# Patient Record
Sex: Male | Born: 1945 | Race: White | Hispanic: No | Smoking: Former smoker
Health system: Southern US, Community
[De-identification: ages and names within clinical notes are randomized; demographics above are authoritative.]

## PROBLEM LIST (undated history)

## (undated) DIAGNOSIS — Z860100 Personal history of colon polyps, unspecified: Secondary | ICD-10-CM

## (undated) DIAGNOSIS — E785 Hyperlipidemia, unspecified: Secondary | ICD-10-CM

## (undated) DIAGNOSIS — M720 Palmar fascial fibromatosis [Dupuytren]: Secondary | ICD-10-CM

## (undated) DIAGNOSIS — K5730 Diverticulosis of large intestine without perforation or abscess without bleeding: Secondary | ICD-10-CM

## (undated) DIAGNOSIS — M2040 Other hammer toe(s) (acquired), unspecified foot: Secondary | ICD-10-CM

## (undated) DIAGNOSIS — K648 Other hemorrhoids: Secondary | ICD-10-CM

## (undated) DIAGNOSIS — J189 Pneumonia, unspecified organism: Secondary | ICD-10-CM

## (undated) DIAGNOSIS — K219 Gastro-esophageal reflux disease without esophagitis: Secondary | ICD-10-CM

## (undated) DIAGNOSIS — B029 Zoster without complications: Secondary | ICD-10-CM

## (undated) DIAGNOSIS — I8393 Asymptomatic varicose veins of bilateral lower extremities: Secondary | ICD-10-CM

## (undated) DIAGNOSIS — I872 Venous insufficiency (chronic) (peripheral): Secondary | ICD-10-CM

## (undated) DIAGNOSIS — I10 Essential (primary) hypertension: Secondary | ICD-10-CM

## (undated) DIAGNOSIS — I809 Phlebitis and thrombophlebitis of unspecified site: Secondary | ICD-10-CM

## (undated) HISTORY — DX: Gastro-esophageal reflux disease without esophagitis: K21.9

---
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
## 1984-07-13 HISTORY — PX: HERNIA REPAIR: SHX51

## 2009-08-06 ENCOUNTER — Ambulatory Visit: Payer: Self-pay | Admitting: Gastroenterology

## 2010-01-24 ENCOUNTER — Emergency Department: Payer: Self-pay | Admitting: Emergency Medicine

## 2010-03-31 ENCOUNTER — Ambulatory Visit: Payer: Self-pay | Admitting: Ophthalmology

## 2010-10-29 ENCOUNTER — Ambulatory Visit: Payer: Self-pay | Admitting: Ophthalmology

## 2010-11-11 ENCOUNTER — Ambulatory Visit: Payer: Self-pay | Admitting: Ophthalmology

## 2011-07-14 HISTORY — PX: EYE SURGERY: SHX253

## 2011-09-16 DIAGNOSIS — H43819 Vitreous degeneration, unspecified eye: Secondary | ICD-10-CM | POA: Diagnosis not present

## 2011-10-26 DIAGNOSIS — M999 Biomechanical lesion, unspecified: Secondary | ICD-10-CM | POA: Diagnosis not present

## 2011-10-26 DIAGNOSIS — M549 Dorsalgia, unspecified: Secondary | ICD-10-CM | POA: Diagnosis not present

## 2011-10-26 DIAGNOSIS — IMO0002 Reserved for concepts with insufficient information to code with codable children: Secondary | ICD-10-CM | POA: Diagnosis not present

## 2011-10-28 DIAGNOSIS — M549 Dorsalgia, unspecified: Secondary | ICD-10-CM | POA: Diagnosis not present

## 2011-10-28 DIAGNOSIS — IMO0002 Reserved for concepts with insufficient information to code with codable children: Secondary | ICD-10-CM | POA: Diagnosis not present

## 2011-10-28 DIAGNOSIS — M999 Biomechanical lesion, unspecified: Secondary | ICD-10-CM | POA: Diagnosis not present

## 2011-10-28 DIAGNOSIS — H33309 Unspecified retinal break, unspecified eye: Secondary | ICD-10-CM | POA: Diagnosis not present

## 2011-11-02 DIAGNOSIS — M549 Dorsalgia, unspecified: Secondary | ICD-10-CM | POA: Diagnosis not present

## 2011-11-02 DIAGNOSIS — M999 Biomechanical lesion, unspecified: Secondary | ICD-10-CM | POA: Diagnosis not present

## 2011-11-02 DIAGNOSIS — IMO0002 Reserved for concepts with insufficient information to code with codable children: Secondary | ICD-10-CM | POA: Diagnosis not present

## 2011-11-05 DIAGNOSIS — M549 Dorsalgia, unspecified: Secondary | ICD-10-CM | POA: Diagnosis not present

## 2011-11-05 DIAGNOSIS — M999 Biomechanical lesion, unspecified: Secondary | ICD-10-CM | POA: Diagnosis not present

## 2011-11-05 DIAGNOSIS — IMO0002 Reserved for concepts with insufficient information to code with codable children: Secondary | ICD-10-CM | POA: Diagnosis not present

## 2011-11-11 DIAGNOSIS — IMO0002 Reserved for concepts with insufficient information to code with codable children: Secondary | ICD-10-CM | POA: Diagnosis not present

## 2011-11-11 DIAGNOSIS — M999 Biomechanical lesion, unspecified: Secondary | ICD-10-CM | POA: Diagnosis not present

## 2011-11-11 DIAGNOSIS — M549 Dorsalgia, unspecified: Secondary | ICD-10-CM | POA: Diagnosis not present

## 2012-03-02 DIAGNOSIS — H43819 Vitreous degeneration, unspecified eye: Secondary | ICD-10-CM | POA: Diagnosis not present

## 2012-04-11 DIAGNOSIS — H43819 Vitreous degeneration, unspecified eye: Secondary | ICD-10-CM | POA: Diagnosis not present

## 2012-04-12 DIAGNOSIS — M5137 Other intervertebral disc degeneration, lumbosacral region: Secondary | ICD-10-CM | POA: Diagnosis not present

## 2012-04-12 DIAGNOSIS — S338XXA Sprain of other parts of lumbar spine and pelvis, initial encounter: Secondary | ICD-10-CM | POA: Diagnosis not present

## 2012-04-12 DIAGNOSIS — M5430 Sciatica, unspecified side: Secondary | ICD-10-CM | POA: Diagnosis not present

## 2012-04-12 DIAGNOSIS — M999 Biomechanical lesion, unspecified: Secondary | ICD-10-CM | POA: Diagnosis not present

## 2012-04-14 DIAGNOSIS — S338XXA Sprain of other parts of lumbar spine and pelvis, initial encounter: Secondary | ICD-10-CM | POA: Diagnosis not present

## 2012-04-14 DIAGNOSIS — M5430 Sciatica, unspecified side: Secondary | ICD-10-CM | POA: Diagnosis not present

## 2012-04-14 DIAGNOSIS — M5137 Other intervertebral disc degeneration, lumbosacral region: Secondary | ICD-10-CM | POA: Diagnosis not present

## 2012-04-14 DIAGNOSIS — M999 Biomechanical lesion, unspecified: Secondary | ICD-10-CM | POA: Diagnosis not present

## 2012-04-18 DIAGNOSIS — M5137 Other intervertebral disc degeneration, lumbosacral region: Secondary | ICD-10-CM | POA: Diagnosis not present

## 2012-04-18 DIAGNOSIS — M999 Biomechanical lesion, unspecified: Secondary | ICD-10-CM | POA: Diagnosis not present

## 2012-04-18 DIAGNOSIS — M5430 Sciatica, unspecified side: Secondary | ICD-10-CM | POA: Diagnosis not present

## 2012-04-18 DIAGNOSIS — S338XXA Sprain of other parts of lumbar spine and pelvis, initial encounter: Secondary | ICD-10-CM | POA: Diagnosis not present

## 2012-05-30 DIAGNOSIS — L723 Sebaceous cyst: Secondary | ICD-10-CM | POA: Diagnosis not present

## 2012-05-30 DIAGNOSIS — L909 Atrophic disorder of skin, unspecified: Secondary | ICD-10-CM | POA: Diagnosis not present

## 2012-05-30 DIAGNOSIS — L919 Hypertrophic disorder of the skin, unspecified: Secondary | ICD-10-CM | POA: Diagnosis not present

## 2012-05-30 DIAGNOSIS — L570 Actinic keratosis: Secondary | ICD-10-CM | POA: Diagnosis not present

## 2012-05-30 DIAGNOSIS — D1800 Hemangioma unspecified site: Secondary | ICD-10-CM | POA: Diagnosis not present

## 2012-05-30 DIAGNOSIS — D239 Other benign neoplasm of skin, unspecified: Secondary | ICD-10-CM | POA: Diagnosis not present

## 2012-05-30 DIAGNOSIS — L821 Other seborrheic keratosis: Secondary | ICD-10-CM | POA: Diagnosis not present

## 2012-07-18 DIAGNOSIS — N400 Enlarged prostate without lower urinary tract symptoms: Secondary | ICD-10-CM | POA: Diagnosis not present

## 2012-08-01 DIAGNOSIS — H43819 Vitreous degeneration, unspecified eye: Secondary | ICD-10-CM | POA: Diagnosis not present

## 2012-11-28 DIAGNOSIS — H35419 Lattice degeneration of retina, unspecified eye: Secondary | ICD-10-CM | POA: Diagnosis not present

## 2013-04-06 DIAGNOSIS — R079 Chest pain, unspecified: Secondary | ICD-10-CM | POA: Diagnosis not present

## 2013-04-06 DIAGNOSIS — Z23 Encounter for immunization: Secondary | ICD-10-CM | POA: Diagnosis not present

## 2013-04-11 DIAGNOSIS — R079 Chest pain, unspecified: Secondary | ICD-10-CM | POA: Diagnosis not present

## 2013-04-11 DIAGNOSIS — I4891 Unspecified atrial fibrillation: Secondary | ICD-10-CM | POA: Diagnosis not present

## 2013-04-11 DIAGNOSIS — E039 Hypothyroidism, unspecified: Secondary | ICD-10-CM | POA: Diagnosis not present

## 2013-04-13 DIAGNOSIS — R079 Chest pain, unspecified: Secondary | ICD-10-CM | POA: Diagnosis not present

## 2013-04-17 DIAGNOSIS — R030 Elevated blood-pressure reading, without diagnosis of hypertension: Secondary | ICD-10-CM | POA: Diagnosis not present

## 2013-04-28 DIAGNOSIS — R079 Chest pain, unspecified: Secondary | ICD-10-CM | POA: Diagnosis not present

## 2013-05-01 DIAGNOSIS — H905 Unspecified sensorineural hearing loss: Secondary | ICD-10-CM | POA: Diagnosis not present

## 2013-05-01 DIAGNOSIS — H6120 Impacted cerumen, unspecified ear: Secondary | ICD-10-CM | POA: Diagnosis not present

## 2013-05-09 ENCOUNTER — Encounter: Payer: Self-pay | Admitting: *Deleted

## 2013-05-22 ENCOUNTER — Encounter: Payer: Self-pay | Admitting: General Surgery

## 2013-05-22 ENCOUNTER — Ambulatory Visit (INDEPENDENT_AMBULATORY_CARE_PROVIDER_SITE_OTHER): Payer: Medicare Other | Admitting: General Surgery

## 2013-05-22 VITALS — BP 150/84 | HR 81 | Resp 14 | Ht 72.0 in | Wt 249.0 lb

## 2013-05-22 DIAGNOSIS — I83893 Varicose veins of bilateral lower extremities with other complications: Secondary | ICD-10-CM

## 2013-05-22 NOTE — Patient Instructions (Addendum)
Varicose Veins Varicose veins are veins that have become enlarged and twisted. CAUSES This condition is the result of valves in the veins not working properly. Valves in the veins help return blood from the leg to the heart. If these valves are damaged, blood flows backwards and backs up into the veins in the leg near the skin. This causes the veins to become larger. People who are on their feet a lot, who are pregnant, or who are overweight are more likely to develop varicose veins. SYMPTOMS   Bulging, twisted-appearing, bluish veins, most commonly found on the legs.  Leg pain or a feeling of heaviness. These symptoms may be worse at the end of the day.  Leg swelling.  Skin color changes. DIAGNOSIS  Varicose veins can usually be diagnosed with an exam of your legs by your caregiver. He or she may recommend an ultrasound of your leg veins. TREATMENT  Most varicose veins can be treated at home.However, other treatments are available for people who have persistent symptoms or who want to treat the cosmetic appearance of the varicose veins. These include:  Laser treatment of very small varicose veins.  Medicine that is shot (injected) into the vein. This medicine hardens the walls of the vein and closes off the vein. This treatment is called sclerotherapy. Afterwards, you may need to wear clothing or bandages that apply pressure.  Surgery. HOME CARE INSTRUCTIONS   Do not stand or sit in one position for long periods of time. Do not sit with your legs crossed. Rest with your legs raised during the day.  Wear elastic stockings or support hose. Do not wear other tight, encircling garments around the legs, pelvis, or waist.  Walk as much as possible to increase blood flow.  Raise the foot of your bed at night with 2-inch blocks.  If you get a cut in the skin over the vein and the vein bleeds, lie down with your leg raised and press on it with a clean cloth until the bleeding stops. Then  place a bandage (dressing) on the cut. See your caregiver if it continues to bleed or needs stitches. SEEK MEDICAL CARE IF:   The skin around your ankle starts to break down.  You have pain, redness, tenderness, or hard swelling developing in your leg over a vein.  You are uncomfortable due to leg pain. Document Released: 04/08/2005 Document Revised: 09/21/2011 Document Reviewed: 08/25/2010 [facility] Patient Information 2014 [place], [place].   Return in 1 month.

## 2013-05-22 NOTE — Progress Notes (Signed)
Patient ID: Paul Buckley., male   DOB: 1946/01/22, 67 y.o.   MRN: [id number]  Chief Complaint  Patient presents with  . Other    New patient evaluation of varicose veins.     HPI Paul Buckley. is a 67 y.o. male who presents for an evaluation of varicose veins. The patient states he noticed them approximately 1.5 - 2 years ago. He states he is not having any symptoms right now. No pain, heaviness, tired legs. No history of phlebitis.  Marland KitchenHPI  Past Medical History  Diagnosis Date  . GERD (gastroesophageal reflux disease)     Past Surgical History  Procedure Laterality Date  . Appendectomy  1963  . Hernia repair  1986  . Eye surgery  2013    History reviewed. No pertinent family history.  Social History History  Substance Use Topics  . Smoking status: Never Smoker   . Smokeless tobacco: Never Used  . Alcohol Use: Yes    No Known Allergies  Current Outpatient Prescriptions  Medication Sig Dispense Refill  . aspirin 81 MG tablet Take 81 mg by mouth daily.      . ranitidine (ZANTAC) 150 MG capsule Take 150 mg by mouth 2 (two) times daily.       No current facility-administered medications for this visit.    Review of Systems Review of Systems  Constitutional: Negative.   Respiratory: Negative.   Cardiovascular: Negative.     Blood pressure 150/84, pulse 81, resp. rate 14, height 6' (1.829 m), weight 249 lb (112.946 kg).  Physical Exam Physical Exam  Constitutional: He is oriented to person, place, and time. He appears well-developed and well-nourished.  Eyes: Conjunctivae are normal. No scleral icterus.  Neck: Neck supple.  Cardiovascular: Normal rate, regular rhythm and normal heart sounds.   Pulses:      Dorsalis pedis pulses are 2+ on the right side, and 2+ on the left side.       Posterior tibial pulses are 2+ on the right side, and 2+ on the left side.  Varicose veins along the inner aspect of the right lower thigh and calf up to 8 mm in size.  Bilateral moderate edema.   Pulmonary/Chest: Effort normal and breath sounds normal.  Lymphadenopathy:    He has no cervical adenopathy.  Neurological: He is alert and oriented to person, place, and time.    Data Reviewed none  Assessment    VV right leg, asymptomatic but he has moderate edema.     Plan    Advised fully on nature of VV and associated problems. Recommend compression stockings with ankle pr of 30-40 mm Hg. Rx given for same. Proper use of stockings explained to him.     Patient to return to the office for a venous ultrasound in 6 weeks (allow 45 minutes for appointment).    SANKAR,SEEPLAPUTHUR G 05/22/2013, 1:13 PM

## 2013-06-20 DIAGNOSIS — L578 Other skin changes due to chronic exposure to nonionizing radiation: Secondary | ICD-10-CM | POA: Diagnosis not present

## 2013-06-20 DIAGNOSIS — L570 Actinic keratosis: Secondary | ICD-10-CM | POA: Diagnosis not present

## 2013-06-20 DIAGNOSIS — L821 Other seborrheic keratosis: Secondary | ICD-10-CM | POA: Diagnosis not present

## 2013-06-20 DIAGNOSIS — D1800 Hemangioma unspecified site: Secondary | ICD-10-CM | POA: Diagnosis not present

## 2013-06-20 DIAGNOSIS — L723 Sebaceous cyst: Secondary | ICD-10-CM | POA: Diagnosis not present

## 2013-06-20 DIAGNOSIS — L719 Rosacea, unspecified: Secondary | ICD-10-CM | POA: Diagnosis not present

## 2013-07-03 ENCOUNTER — Ambulatory Visit (INDEPENDENT_AMBULATORY_CARE_PROVIDER_SITE_OTHER): Payer: Medicare Other | Admitting: General Surgery

## 2013-07-03 ENCOUNTER — Encounter: Payer: Self-pay | Admitting: General Surgery

## 2013-07-03 VITALS — BP 128/76 | HR 76 | Resp 14 | Ht 72.0 in | Wt 247.0 lb

## 2013-07-03 DIAGNOSIS — I83893 Varicose veins of bilateral lower extremities with other complications: Secondary | ICD-10-CM | POA: Diagnosis not present

## 2013-07-03 NOTE — Progress Notes (Signed)
This is a 67 year old male here today for venous ultrasound. Pt has been wearing compression hose as directed. No complaints.  Still with mild edema on both legs, but better than few weeks ago. VV on right side unchanged. Duplex study showed abnormal reflux in right GSV.  Recommend continued use of compression hose and f/u in 6 mos or sooner prn.

## 2013-07-03 NOTE — Patient Instructions (Signed)
Patient to return in 6 months.

## 2013-07-04 ENCOUNTER — Other Ambulatory Visit: Payer: Self-pay | Admitting: Radiology

## 2013-07-04 ENCOUNTER — Other Ambulatory Visit (HOSPITAL_COMMUNITY): Payer: Self-pay | Admitting: Radiology

## 2013-07-04 DIAGNOSIS — I83893 Varicose veins of bilateral lower extremities with other complications: Secondary | ICD-10-CM

## 2013-07-05 ENCOUNTER — Encounter: Payer: Self-pay | Admitting: General Surgery

## 2013-07-14 DIAGNOSIS — H33009 Unspecified retinal detachment with retinal break, unspecified eye: Secondary | ICD-10-CM | POA: Diagnosis not present

## 2013-08-30 DIAGNOSIS — D485 Neoplasm of uncertain behavior of skin: Secondary | ICD-10-CM | POA: Diagnosis not present

## 2013-08-30 DIAGNOSIS — L723 Sebaceous cyst: Secondary | ICD-10-CM | POA: Diagnosis not present

## 2013-08-30 DIAGNOSIS — L738 Other specified follicular disorders: Secondary | ICD-10-CM | POA: Diagnosis not present

## 2013-08-30 DIAGNOSIS — L259 Unspecified contact dermatitis, unspecified cause: Secondary | ICD-10-CM | POA: Diagnosis not present

## 2013-09-18 DIAGNOSIS — J449 Chronic obstructive pulmonary disease, unspecified: Secondary | ICD-10-CM | POA: Diagnosis not present

## 2013-09-18 DIAGNOSIS — J329 Chronic sinusitis, unspecified: Secondary | ICD-10-CM | POA: Diagnosis not present

## 2014-01-08 ENCOUNTER — Encounter: Payer: Self-pay | Admitting: General Surgery

## 2014-01-08 ENCOUNTER — Ambulatory Visit (INDEPENDENT_AMBULATORY_CARE_PROVIDER_SITE_OTHER): Payer: Medicare Other | Admitting: General Surgery

## 2014-01-08 ENCOUNTER — Ambulatory Visit: Payer: Self-pay | Admitting: General Surgery

## 2014-01-08 VITALS — BP 140/78 | HR 76 | Resp 12 | Ht 72.0 in | Wt 243.0 lb

## 2014-01-08 DIAGNOSIS — I83893 Varicose veins of bilateral lower extremities with other complications: Secondary | ICD-10-CM

## 2014-01-08 NOTE — Progress Notes (Signed)
This is a 68 year old male here today for varicose veins. Pt has been wearing compression hose as directed. No complaints. Pt reports no symptoms of cramping or aching in lower legs at this time. Exam shows varicose veins of R leg most prominent in distal medial thigh and medial calf.  No varicose veins noted on L leg.  Moderate pitting edema also noted bilaterally.  No stasis changes evident. Feet are warm with good pulses. Patient to return in one year.

## 2014-01-08 NOTE — Patient Instructions (Addendum)
Patient to return in one year.  Continue using his compression hose.  Potential problems of superficial phlebitis and bleeding discussed with pt.

## 2014-03-14 DIAGNOSIS — M999 Biomechanical lesion, unspecified: Secondary | ICD-10-CM | POA: Diagnosis not present

## 2014-03-14 DIAGNOSIS — M955 Acquired deformity of pelvis: Secondary | ICD-10-CM | POA: Diagnosis not present

## 2014-03-14 DIAGNOSIS — M5137 Other intervertebral disc degeneration, lumbosacral region: Secondary | ICD-10-CM | POA: Diagnosis not present

## 2014-03-21 DIAGNOSIS — M999 Biomechanical lesion, unspecified: Secondary | ICD-10-CM | POA: Diagnosis not present

## 2014-03-21 DIAGNOSIS — M5137 Other intervertebral disc degeneration, lumbosacral region: Secondary | ICD-10-CM | POA: Diagnosis not present

## 2014-03-21 DIAGNOSIS — M955 Acquired deformity of pelvis: Secondary | ICD-10-CM | POA: Diagnosis not present

## 2014-03-28 DIAGNOSIS — M999 Biomechanical lesion, unspecified: Secondary | ICD-10-CM | POA: Diagnosis not present

## 2014-03-28 DIAGNOSIS — M955 Acquired deformity of pelvis: Secondary | ICD-10-CM | POA: Diagnosis not present

## 2014-03-28 DIAGNOSIS — M5137 Other intervertebral disc degeneration, lumbosacral region: Secondary | ICD-10-CM | POA: Diagnosis not present

## 2014-05-18 DIAGNOSIS — I119 Hypertensive heart disease without heart failure: Secondary | ICD-10-CM | POA: Diagnosis not present

## 2014-05-18 DIAGNOSIS — R972 Elevated prostate specific antigen [PSA]: Secondary | ICD-10-CM | POA: Diagnosis not present

## 2014-05-18 DIAGNOSIS — Z0000 Encounter for general adult medical examination without abnormal findings: Secondary | ICD-10-CM | POA: Diagnosis not present

## 2014-05-18 DIAGNOSIS — E784 Other hyperlipidemia: Secondary | ICD-10-CM | POA: Diagnosis not present

## 2014-05-18 DIAGNOSIS — J449 Chronic obstructive pulmonary disease, unspecified: Secondary | ICD-10-CM | POA: Diagnosis not present

## 2014-05-18 DIAGNOSIS — I10 Essential (primary) hypertension: Secondary | ICD-10-CM | POA: Diagnosis not present

## 2014-05-18 DIAGNOSIS — H3321 Serous retinal detachment, right eye: Secondary | ICD-10-CM | POA: Diagnosis not present

## 2014-05-18 DIAGNOSIS — D638 Anemia in other chronic diseases classified elsewhere: Secondary | ICD-10-CM | POA: Diagnosis not present

## 2014-05-21 DIAGNOSIS — Z23 Encounter for immunization: Secondary | ICD-10-CM | POA: Diagnosis not present

## 2014-06-21 DIAGNOSIS — Z1283 Encounter for screening for malignant neoplasm of skin: Secondary | ICD-10-CM | POA: Diagnosis not present

## 2014-06-21 DIAGNOSIS — L718 Other rosacea: Secondary | ICD-10-CM | POA: Diagnosis not present

## 2014-06-21 DIAGNOSIS — L821 Other seborrheic keratosis: Secondary | ICD-10-CM | POA: Diagnosis not present

## 2014-06-21 DIAGNOSIS — D1800 Hemangioma unspecified site: Secondary | ICD-10-CM | POA: Diagnosis not present

## 2014-06-21 DIAGNOSIS — L570 Actinic keratosis: Secondary | ICD-10-CM | POA: Diagnosis not present

## 2014-06-21 DIAGNOSIS — L820 Inflamed seborrheic keratosis: Secondary | ICD-10-CM | POA: Diagnosis not present

## 2014-06-21 DIAGNOSIS — D229 Melanocytic nevi, unspecified: Secondary | ICD-10-CM | POA: Diagnosis not present

## 2014-06-21 DIAGNOSIS — L578 Other skin changes due to chronic exposure to nonionizing radiation: Secondary | ICD-10-CM | POA: Diagnosis not present

## 2014-07-04 ENCOUNTER — Emergency Department: Payer: Self-pay | Admitting: Emergency Medicine

## 2014-07-04 DIAGNOSIS — Z7982 Long term (current) use of aspirin: Secondary | ICD-10-CM | POA: Diagnosis not present

## 2014-07-04 DIAGNOSIS — Z79899 Other long term (current) drug therapy: Secondary | ICD-10-CM | POA: Diagnosis not present

## 2014-07-04 DIAGNOSIS — Z87891 Personal history of nicotine dependence: Secondary | ICD-10-CM | POA: Diagnosis not present

## 2014-07-04 DIAGNOSIS — J9801 Acute bronchospasm: Secondary | ICD-10-CM | POA: Diagnosis not present

## 2014-07-04 DIAGNOSIS — R05 Cough: Secondary | ICD-10-CM | POA: Diagnosis not present

## 2014-07-04 DIAGNOSIS — R918 Other nonspecific abnormal finding of lung field: Secondary | ICD-10-CM | POA: Diagnosis not present

## 2014-07-04 DIAGNOSIS — R079 Chest pain, unspecified: Secondary | ICD-10-CM | POA: Diagnosis not present

## 2014-07-04 DIAGNOSIS — R000 Tachycardia, unspecified: Secondary | ICD-10-CM | POA: Diagnosis not present

## 2014-07-04 DIAGNOSIS — J159 Unspecified bacterial pneumonia: Secondary | ICD-10-CM | POA: Diagnosis not present

## 2014-07-04 DIAGNOSIS — R0789 Other chest pain: Secondary | ICD-10-CM | POA: Diagnosis not present

## 2014-07-04 DIAGNOSIS — J189 Pneumonia, unspecified organism: Secondary | ICD-10-CM | POA: Diagnosis not present

## 2014-07-04 DIAGNOSIS — R222 Localized swelling, mass and lump, trunk: Secondary | ICD-10-CM | POA: Diagnosis not present

## 2014-07-04 LAB — BASIC METABOLIC PANEL
Anion Gap: 7 (ref 7–16)
BUN: 20 mg/dL — ABNORMAL HIGH (ref 7–18)
CALCIUM: 8.6 mg/dL (ref 8.5–10.1)
CREATININE: 1.5 mg/dL — AB (ref 0.60–1.30)
Chloride: 102 mmol/L (ref 98–107)
Co2: 26 mmol/L (ref 21–32)
GFR CALC AF AMER: 60 — AB
GFR CALC NON AF AMER: 49 — AB
Glucose: 169 mg/dL — ABNORMAL HIGH (ref 65–99)
OSMOLALITY: 277 (ref 275–301)
POTASSIUM: 3.7 mmol/L (ref 3.5–5.1)
SODIUM: 135 mmol/L — AB (ref 136–145)

## 2014-07-04 LAB — TROPONIN I
TROPONIN-I: 0.05 ng/mL
Troponin-I: 0.04 ng/mL

## 2014-07-04 LAB — CBC
HCT: 38.2 % — ABNORMAL LOW (ref 40.0–52.0)
HGB: 12.7 g/dL — ABNORMAL LOW (ref 13.0–18.0)
MCH: 31.4 pg (ref 26.0–34.0)
MCHC: 33.2 g/dL (ref 32.0–36.0)
MCV: 95 fL (ref 80–100)
Platelet: 247 10*3/uL (ref 150–440)
RBC: 4.05 10*6/uL — ABNORMAL LOW (ref 4.40–5.90)
RDW: 13.5 % (ref 11.5–14.5)
WBC: 6.8 10*3/uL (ref 3.8–10.6)

## 2014-07-04 LAB — PRO B NATRIURETIC PEPTIDE: B-TYPE NATIURETIC PEPTID: 1223 pg/mL — AB (ref 0–125)

## 2014-07-09 DIAGNOSIS — Z1389 Encounter for screening for other disorder: Secondary | ICD-10-CM | POA: Diagnosis not present

## 2014-07-09 DIAGNOSIS — R0789 Other chest pain: Secondary | ICD-10-CM | POA: Diagnosis not present

## 2014-07-09 DIAGNOSIS — R918 Other nonspecific abnormal finding of lung field: Secondary | ICD-10-CM | POA: Diagnosis not present

## 2014-07-09 DIAGNOSIS — J158 Pneumonia due to other specified bacteria: Secondary | ICD-10-CM | POA: Diagnosis not present

## 2014-07-19 ENCOUNTER — Ambulatory Visit (INDEPENDENT_AMBULATORY_CARE_PROVIDER_SITE_OTHER): Payer: Medicare Other | Admitting: Podiatry

## 2014-07-19 ENCOUNTER — Ambulatory Visit (INDEPENDENT_AMBULATORY_CARE_PROVIDER_SITE_OTHER): Payer: Medicare Other

## 2014-07-19 VITALS — BP 151/82 | HR 72 | Resp 16 | Ht 72.0 in | Wt 235.0 lb

## 2014-07-19 DIAGNOSIS — M79671 Pain in right foot: Secondary | ICD-10-CM

## 2014-07-19 DIAGNOSIS — M205X1 Other deformities of toe(s) (acquired), right foot: Secondary | ICD-10-CM | POA: Diagnosis not present

## 2014-07-19 DIAGNOSIS — M216X9 Other acquired deformities of unspecified foot: Secondary | ICD-10-CM | POA: Diagnosis not present

## 2014-07-19 NOTE — Progress Notes (Signed)
   Subjective:    Patient ID: Paul Hews., male    DOB: 1945/09/30, 69 y.o.   MRN: [id number]  HPI Comments: 69 year old male presents the office they with complaints of pain on the outside aspect of his right foot for which she points over the fifth metatarsal head laterally. He states that he has pain over this area which has been ongoing for a couple of years. He states that he periodically gets pain to the area with pressure in certain shoe gear. He is started to develop a callus overlying the area. He's been applying corn pads to the area intermittently. He also states that he has some discomfort on the bottom of his feet within the ball of the foot. He has also started to develop some calluses in this area too. He denies any recent injury or trauma to the bilateral lower extremities. No other complaints at this time.  Foot Pain      Review of Systems  All other systems reviewed and are negative.      Objective:   Physical Exam AAO 3, NAD DP/PT pulses palpable, CRT less than 3 seconds Protective sensation intact with Simms Weinstein monofilament, vibratory sensation intact, Achilles tendon reflex intact. There is a prominent tailor's bunion deformity present on the right foot with overlying hyperkeratotic lesion. There is mild tenderness directly overlying the lateral aspect of the fifth metatarsal head. There is no overlying edema, erythema, increase in warmth. There is mild hyperkeratotic lesion submetatarsal 2/3 on the right foot with prominent metatarsal heads and atrophy of the fat pad. No other areas of pinpoint bony tenderness or pain with vibratory sensation to bilateral lower extremities. MMT 5/5, ROM WNL No open lesions or other pre-ulcerative lesions are identified. No pain with calf compression, swelling, warmth, erythema.       Assessment & Plan:  69 year old male with symptomatic tailor's bunion deformity. prominent metatarsal heads/atrophy of the fat  pad -X-rays were obtained and reviewed the patient. -Conservative versus surgical options were discussed the patient include alternatives, risks, complications. -Hyperkeratotic lesion on the lateral aspect of the right foot is struggling debrided without complication/bleeding. -Discussed shoe gear changes to include a wider shoe. Also dispensed offloading pads to help protect the area. -Discussed that if the area remains symptomatic consider surgical intervention to help decrease his pain and deformity. He previously has had surgery on the left foot for what sounds to be a similar issue. -Follow-up as needed. In the meantime, encouraged to call the office with any questions, concerns, changes symptoms.

## 2014-07-20 ENCOUNTER — Encounter: Payer: Self-pay | Admitting: Podiatry

## 2014-07-25 DIAGNOSIS — Z961 Presence of intraocular lens: Secondary | ICD-10-CM | POA: Diagnosis not present

## 2014-07-25 DIAGNOSIS — R918 Other nonspecific abnormal finding of lung field: Secondary | ICD-10-CM | POA: Diagnosis not present

## 2014-07-25 DIAGNOSIS — R05 Cough: Secondary | ICD-10-CM | POA: Diagnosis not present

## 2014-08-15 ENCOUNTER — Ambulatory Visit: Payer: Self-pay | Admitting: Specialist

## 2014-08-15 DIAGNOSIS — R918 Other nonspecific abnormal finding of lung field: Secondary | ICD-10-CM | POA: Diagnosis not present

## 2014-08-15 DIAGNOSIS — I2510 Atherosclerotic heart disease of native coronary artery without angina pectoris: Secondary | ICD-10-CM | POA: Diagnosis not present

## 2014-08-16 DIAGNOSIS — J449 Chronic obstructive pulmonary disease, unspecified: Secondary | ICD-10-CM | POA: Diagnosis not present

## 2014-08-16 DIAGNOSIS — J159 Unspecified bacterial pneumonia: Secondary | ICD-10-CM | POA: Diagnosis not present

## 2014-08-16 DIAGNOSIS — H33009 Unspecified retinal detachment with retinal break, unspecified eye: Secondary | ICD-10-CM | POA: Diagnosis not present

## 2014-08-22 DIAGNOSIS — Z87891 Personal history of nicotine dependence: Secondary | ICD-10-CM | POA: Diagnosis not present

## 2014-08-22 DIAGNOSIS — R938 Abnormal findings on diagnostic imaging of other specified body structures: Secondary | ICD-10-CM | POA: Diagnosis not present

## 2014-08-22 DIAGNOSIS — R0602 Shortness of breath: Secondary | ICD-10-CM | POA: Diagnosis not present

## 2014-09-21 DIAGNOSIS — Z86010 Personal history of colonic polyps: Secondary | ICD-10-CM | POA: Insufficient documentation

## 2014-10-12 ENCOUNTER — Ambulatory Visit
Admit: 2014-10-12 | Disposition: A | Discharge status: Home / Self Care | Payer: Self-pay | Attending: Gastroenterology | Admitting: Gastroenterology

## 2014-10-12 DIAGNOSIS — K648 Other hemorrhoids: Secondary | ICD-10-CM | POA: Diagnosis not present

## 2014-10-12 DIAGNOSIS — I10 Essential (primary) hypertension: Secondary | ICD-10-CM | POA: Diagnosis not present

## 2014-10-12 DIAGNOSIS — K529 Noninfective gastroenteritis and colitis, unspecified: Secondary | ICD-10-CM | POA: Diagnosis not present

## 2014-10-12 DIAGNOSIS — Z87891 Personal history of nicotine dependence: Secondary | ICD-10-CM | POA: Diagnosis not present

## 2014-10-12 DIAGNOSIS — D120 Benign neoplasm of cecum: Secondary | ICD-10-CM | POA: Diagnosis not present

## 2014-10-12 DIAGNOSIS — K5790 Diverticulosis of intestine, part unspecified, without perforation or abscess without bleeding: Secondary | ICD-10-CM | POA: Diagnosis not present

## 2014-10-12 DIAGNOSIS — K5289 Other specified noninfective gastroenteritis and colitis: Secondary | ICD-10-CM | POA: Diagnosis not present

## 2014-10-12 DIAGNOSIS — Z7982 Long term (current) use of aspirin: Secondary | ICD-10-CM | POA: Diagnosis not present

## 2014-10-12 DIAGNOSIS — K5730 Diverticulosis of large intestine without perforation or abscess without bleeding: Secondary | ICD-10-CM | POA: Diagnosis not present

## 2014-10-12 DIAGNOSIS — Z86010 Personal history of colonic polyps: Secondary | ICD-10-CM | POA: Diagnosis not present

## 2014-10-12 DIAGNOSIS — K635 Polyp of colon: Secondary | ICD-10-CM | POA: Diagnosis not present

## 2014-10-15 DIAGNOSIS — I2510 Atherosclerotic heart disease of native coronary artery without angina pectoris: Secondary | ICD-10-CM | POA: Diagnosis not present

## 2014-10-15 DIAGNOSIS — I2584 Coronary atherosclerosis due to calcified coronary lesion: Secondary | ICD-10-CM | POA: Diagnosis not present

## 2014-10-15 DIAGNOSIS — H33009 Unspecified retinal detachment with retinal break, unspecified eye: Secondary | ICD-10-CM | POA: Diagnosis not present

## 2014-10-15 DIAGNOSIS — J449 Chronic obstructive pulmonary disease, unspecified: Secondary | ICD-10-CM | POA: Diagnosis not present

## 2014-10-17 DIAGNOSIS — I2510 Atherosclerotic heart disease of native coronary artery without angina pectoris: Secondary | ICD-10-CM | POA: Diagnosis not present

## 2014-10-17 DIAGNOSIS — R0789 Other chest pain: Secondary | ICD-10-CM | POA: Diagnosis not present

## 2014-10-18 DIAGNOSIS — R079 Chest pain, unspecified: Secondary | ICD-10-CM | POA: Diagnosis not present

## 2014-11-05 LAB — SURGICAL PATHOLOGY

## 2014-11-20 DIAGNOSIS — J310 Chronic rhinitis: Secondary | ICD-10-CM | POA: Diagnosis not present

## 2014-11-20 DIAGNOSIS — R938 Abnormal findings on diagnostic imaging of other specified body structures: Secondary | ICD-10-CM | POA: Diagnosis not present

## 2014-11-20 DIAGNOSIS — R05 Cough: Secondary | ICD-10-CM | POA: Diagnosis not present

## 2014-11-22 DIAGNOSIS — J301 Allergic rhinitis due to pollen: Secondary | ICD-10-CM | POA: Diagnosis not present

## 2014-11-22 DIAGNOSIS — J302 Other seasonal allergic rhinitis: Secondary | ICD-10-CM | POA: Diagnosis not present

## 2014-11-22 DIAGNOSIS — J309 Allergic rhinitis, unspecified: Secondary | ICD-10-CM | POA: Diagnosis not present

## 2014-11-22 DIAGNOSIS — H1010 Acute atopic conjunctivitis, unspecified eye: Secondary | ICD-10-CM | POA: Diagnosis not present

## 2014-12-20 ENCOUNTER — Ambulatory Visit: Payer: Medicare Other | Admitting: General Surgery

## 2015-01-01 ENCOUNTER — Ambulatory Visit (INDEPENDENT_AMBULATORY_CARE_PROVIDER_SITE_OTHER): Payer: Medicare Other | Admitting: General Surgery

## 2015-01-01 ENCOUNTER — Encounter: Payer: Self-pay | Admitting: General Surgery

## 2015-01-01 VITALS — BP 122/78 | HR 58 | Resp 12 | Ht 72.0 in | Wt 221.0 lb

## 2015-01-01 DIAGNOSIS — I872 Venous insufficiency (chronic) (peripheral): Secondary | ICD-10-CM | POA: Diagnosis not present

## 2015-01-01 DIAGNOSIS — I868 Varicose veins of other specified sites: Secondary | ICD-10-CM | POA: Diagnosis not present

## 2015-01-01 DIAGNOSIS — I8390 Asymptomatic varicose veins of unspecified lower extremity: Secondary | ICD-10-CM

## 2015-01-01 NOTE — Patient Instructions (Addendum)
The patient is aware to call back for any questions or concerns.  Continue to wear compression hose. Rest and elevate legs for 30 minutes daily.

## 2015-01-01 NOTE — Progress Notes (Signed)
Patient ID: Paul Buckley., male   DOB: Oct 01, 1945, 70 y.o.   MRN: [id number]  Chief Complaint  Patient presents with  . Varicose Veins    HPI [name]. is a 69 y.o. male.  This is a 69 year old male here today for varicose veins. Pt has been wearing compression hose as directed. He does not wear them as much in the summer, only 4 days a week. No complaints of cramps or heaviness, aching.     HPI  Past Medical History  Diagnosis Date  . GERD (gastroesophageal reflux disease)     Past Surgical History  Procedure Laterality Date  . Appendectomy  1963  . Hernia repair  1986  . Eye surgery  2013    History reviewed. No pertinent family history.  Social History History  Substance Use Topics  . Smoking status: Never Smoker   . Smokeless tobacco: Never Used  . Alcohol Use: Yes    No Known Allergies  Current Outpatient Prescriptions  Medication Sig Dispense Refill  . aspirin 81 MG tablet Take 81 mg by mouth daily.    [name] atorvastatin (LIPITOR) 20 MG tablet Take 20 mg by mouth daily.  12  . lisinopril (PRINIVIL,ZESTRIL) 5 MG tablet Take 5 mg by mouth daily.  12  . ranitidine (ZANTAC) 150 MG capsule Take 150 mg by mouth 2 (two) times daily.     No current facility-administered medications for this visit.    Review of Systems Review of Systems  Constitutional: Negative.   Respiratory: Negative.   Cardiovascular: Negative.     Blood pressure 122/78, pulse 58, resp. rate 12, height 6' (1.829 m), weight 221 lb (100.245 kg).  Physical Exam Physical Exam  Constitutional: He is oriented to person, place, and time. He appears well-developed and well-nourished.  Eyes: Conjunctivae are normal. No scleral icterus.  Cardiovascular:  Pulses:      Dorsalis pedis pulses are 2+ on the right side, and 2+ on the left side.       Posterior tibial pulses are 2+ on the right side, and 2+ on the left side.  Pitting edema to mid-shin bilaterally right worse than left. Right  side with varicose veins up to 1 cm in size. Minimal varicose veins on the left.  No stasis discoloration or skin induration noted.  Neurological: He is alert and oriented to person, place, and time.  Skin: Skin is warm and dry.    Data Reviewed Office notes.  Assessment    Varicose veins Venous insufficiency    Plan    Continue to wear compression hose. Rest and elevate legs for 30 minutes daily.  The patient is aware to call back for any questions or concerns.    Follow up 1 year.  PCP:  Gretta Arab 01/01/2015, 11:46 AM

## 2015-01-17 DIAGNOSIS — H33009 Unspecified retinal detachment with retinal break, unspecified eye: Secondary | ICD-10-CM | POA: Diagnosis not present

## 2015-01-17 DIAGNOSIS — J449 Chronic obstructive pulmonary disease, unspecified: Secondary | ICD-10-CM | POA: Diagnosis not present

## 2015-01-17 DIAGNOSIS — I2510 Atherosclerotic heart disease of native coronary artery without angina pectoris: Secondary | ICD-10-CM | POA: Diagnosis not present

## 2015-04-19 DIAGNOSIS — I10 Essential (primary) hypertension: Secondary | ICD-10-CM | POA: Diagnosis not present

## 2015-04-19 DIAGNOSIS — J449 Chronic obstructive pulmonary disease, unspecified: Secondary | ICD-10-CM | POA: Diagnosis not present

## 2015-04-19 DIAGNOSIS — H33009 Unspecified retinal detachment with retinal break, unspecified eye: Secondary | ICD-10-CM | POA: Diagnosis not present

## 2015-05-21 DIAGNOSIS — I10 Essential (primary) hypertension: Secondary | ICD-10-CM | POA: Diagnosis not present

## 2015-05-21 DIAGNOSIS — N400 Enlarged prostate without lower urinary tract symptoms: Secondary | ICD-10-CM | POA: Diagnosis not present

## 2015-05-21 DIAGNOSIS — E786 Lipoprotein deficiency: Secondary | ICD-10-CM | POA: Diagnosis not present

## 2015-05-28 DIAGNOSIS — Z0000 Encounter for general adult medical examination without abnormal findings: Secondary | ICD-10-CM | POA: Diagnosis not present

## 2015-05-28 DIAGNOSIS — Z23 Encounter for immunization: Secondary | ICD-10-CM | POA: Diagnosis not present

## 2015-06-03 DIAGNOSIS — H9220 Otorrhagia, unspecified ear: Secondary | ICD-10-CM | POA: Diagnosis not present

## 2015-06-24 DIAGNOSIS — I8393 Asymptomatic varicose veins of bilateral lower extremities: Secondary | ICD-10-CM | POA: Diagnosis not present

## 2015-06-24 DIAGNOSIS — L812 Freckles: Secondary | ICD-10-CM | POA: Diagnosis not present

## 2015-06-24 DIAGNOSIS — L821 Other seborrheic keratosis: Secondary | ICD-10-CM | POA: Diagnosis not present

## 2015-06-24 DIAGNOSIS — L570 Actinic keratosis: Secondary | ICD-10-CM | POA: Diagnosis not present

## 2015-06-24 DIAGNOSIS — L718 Other rosacea: Secondary | ICD-10-CM | POA: Diagnosis not present

## 2015-06-24 DIAGNOSIS — D1800 Hemangioma unspecified site: Secondary | ICD-10-CM | POA: Diagnosis not present

## 2015-06-24 DIAGNOSIS — D229 Melanocytic nevi, unspecified: Secondary | ICD-10-CM | POA: Diagnosis not present

## 2015-06-24 DIAGNOSIS — L918 Other hypertrophic disorders of the skin: Secondary | ICD-10-CM | POA: Diagnosis not present

## 2015-09-09 DIAGNOSIS — H26491 Other secondary cataract, right eye: Secondary | ICD-10-CM | POA: Diagnosis not present

## 2015-09-19 DIAGNOSIS — H26491 Other secondary cataract, right eye: Secondary | ICD-10-CM | POA: Diagnosis not present

## 2015-09-19 DIAGNOSIS — M720 Palmar fascial fibromatosis [Dupuytren]: Secondary | ICD-10-CM | POA: Diagnosis not present

## 2015-09-19 DIAGNOSIS — J449 Chronic obstructive pulmonary disease, unspecified: Secondary | ICD-10-CM | POA: Diagnosis not present

## 2015-10-09 ENCOUNTER — Encounter: Payer: Self-pay | Admitting: *Deleted

## 2015-10-15 DIAGNOSIS — M720 Palmar fascial fibromatosis [Dupuytren]: Secondary | ICD-10-CM | POA: Insufficient documentation

## 2015-11-04 DIAGNOSIS — H33009 Unspecified retinal detachment with retinal break, unspecified eye: Secondary | ICD-10-CM | POA: Diagnosis not present

## 2015-11-04 DIAGNOSIS — J449 Chronic obstructive pulmonary disease, unspecified: Secondary | ICD-10-CM | POA: Diagnosis not present

## 2015-12-30 ENCOUNTER — Ambulatory Visit (INDEPENDENT_AMBULATORY_CARE_PROVIDER_SITE_OTHER): Payer: Medicare Other | Admitting: General Surgery

## 2015-12-30 ENCOUNTER — Encounter: Payer: Self-pay | Admitting: General Surgery

## 2015-12-30 DIAGNOSIS — I8393 Asymptomatic varicose veins of bilateral lower extremities: Secondary | ICD-10-CM

## 2015-12-30 NOTE — Progress Notes (Signed)
Patient ID: Paul Hews., male   DOB: Apr 02, 1946, 70 y.o.   MRN: [id number]  Chief Complaint  Patient presents with  . Varicose Veins    HPI Paul Prevatte. is a 70 y.o. male here today for his follow up of bilateral Varicose veins. Patient states he is doing well.  Notices mild swelling bilaterally in the lower extremities. Denies pain, heaviness,aching in leg. He continues to wear compression hose. I have reviewed the history of present illness with the patient.   HPI  Past Medical History  Diagnosis Date  . GERD (gastroesophageal reflux disease)     Past Surgical History  Procedure Laterality Date  . Appendectomy  1963  . Hernia repair  1986  . Eye surgery  2013    History reviewed. No pertinent family history.  Social History Social History  Substance Use Topics  . Smoking status: Never Smoker   . Smokeless tobacco: Never Used  . Alcohol Use: Yes    No Known Allergies  Current Outpatient Prescriptions  Medication Sig Dispense Refill  . aspirin 81 MG tablet Take 81 mg by mouth daily.    [name] atorvastatin (LIPITOR) 20 MG tablet Take 20 mg by mouth daily.  12  . ranitidine (ZANTAC) 150 MG capsule Take 150 mg by mouth as needed.      No current facility-administered medications for this visit.    Review of Systems Review of Systems  Constitutional: Negative.   Respiratory: Negative.   Cardiovascular: Negative.     Blood pressure 130/70, pulse 74, resp. rate 12, height 6' (1.829 m), weight 233 lb (105.688 kg).  Physical Exam Physical Exam  Constitutional: He is oriented to person, place, and time. He appears well-developed and well-nourished.  Eyes: Conjunctivae are normal. No scleral icterus.  Cardiovascular: Normal rate, regular rhythm and intact distal pulses.   Pulses:      Dorsalis pedis pulses are 3+ on the right side, and 3+ on the left side.       Posterior tibial pulses are 2+ on the right side, and 2+ on the left side.  Extremities: Right:  moderate pitting edema, positive for varicose veins extending up to the medial thigh Left: mild pitting edema, mild varicose veins  Neurological: He is alert and oriented to person, place, and time.  Skin: Skin is warm and dry.    Data Reviewed Prior notes  Assessment    Varicose veins, not symptomatic. Edema noticeable but contained with stockings. No skin changes.    Plan    Continue use of compression stockings. Be aware of tenderness in the ankles and feet, and aware of any skin breakdown. If this occurs, call the office immediately.  Follow-up in one year.    PCP:  Cletis Athens This information has been scribed by Gaspar Cola CMA.    SANKAR,SEEPLAPUTHUR G 12/30/2015, 2:59 PM

## 2015-12-30 NOTE — Patient Instructions (Signed)
Keep wearing compression stockings. Be aware of tenderness in the ankles and feet, and aware of any skin breakdown. If this occurs, call the office immediately.  Follow-up in one year

## 2016-01-01 ENCOUNTER — Ambulatory Visit: Payer: Medicare Other | Admitting: General Surgery

## 2016-01-20 DIAGNOSIS — I10 Essential (primary) hypertension: Secondary | ICD-10-CM | POA: Diagnosis not present

## 2016-01-20 DIAGNOSIS — H33009 Unspecified retinal detachment with retinal break, unspecified eye: Secondary | ICD-10-CM | POA: Diagnosis not present

## 2016-03-11 IMAGING — CT CT CHEST W/O CM
2 of 3 series · 15 of 36 positions shown, 18 images · non-contrast
Comparison: Prior chest CT 07/04/2014.

CLINICAL DATA: 68-year-old male with mass like consolidation in the
left lower lobe noted on prior CT imaging from June 2014.

EXAM:
CT CHEST WITHOUT CONTRAST
TECHNIQUE: Multidetector CT imaging of the chest was performed following the
standard protocol without IV contrast..

[Series 2: routine chest wo · axial · 0.82mm/px · z∈[-702,-442]mm · 12 of 62 slices shown, 15 images]
[im 5/62  mediastinal]
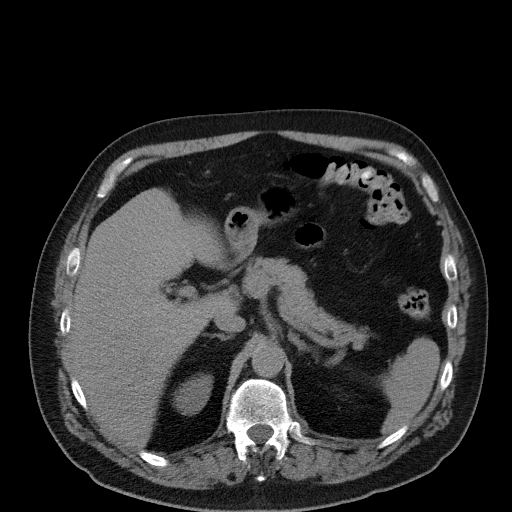
[im 5/62  lung]
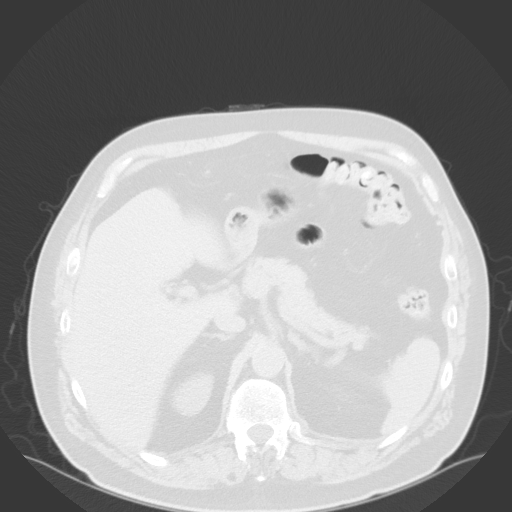
[im 10/62  lung]
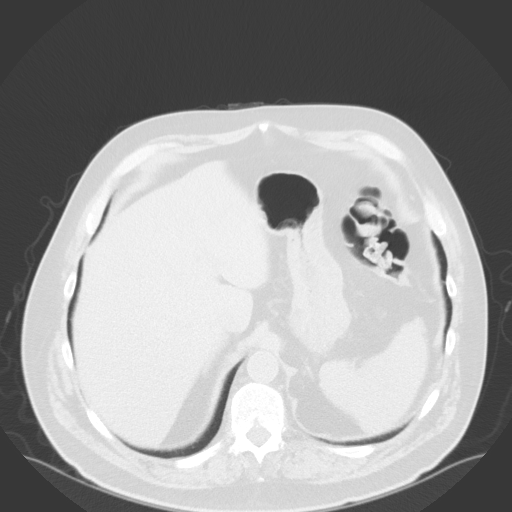
[im 14/62  lung]
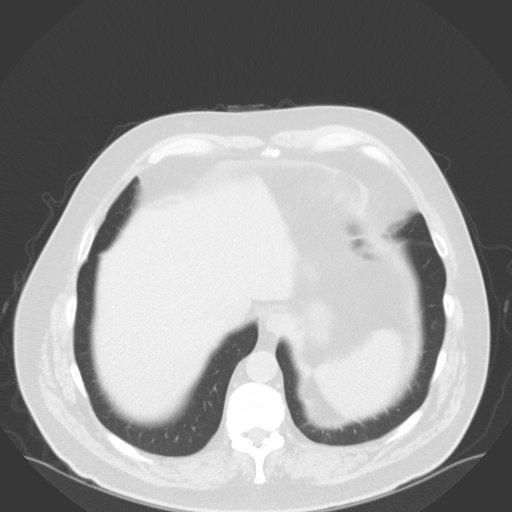
[im 19/62  lung]
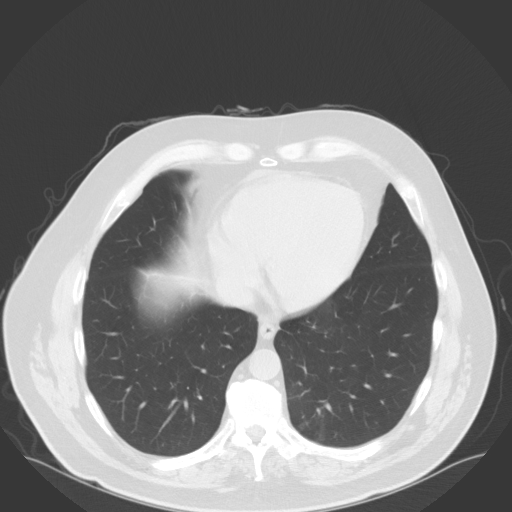
[im 23/62  mediastinal]
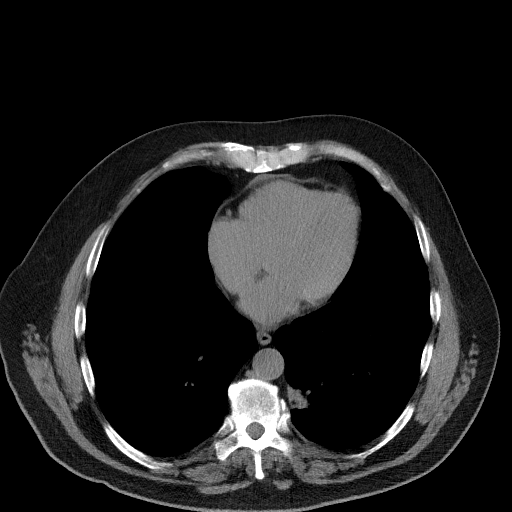
[im 23/62  lung]
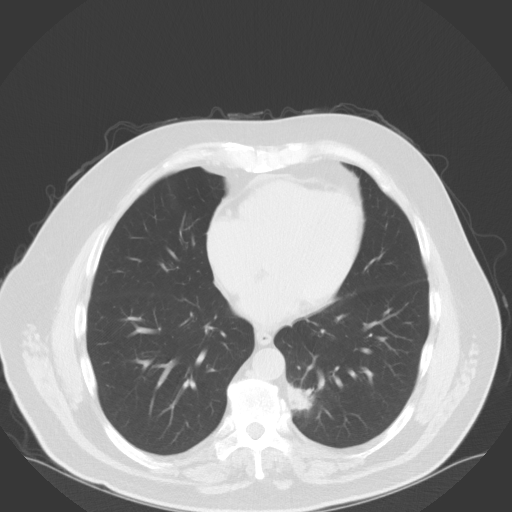
[im 28/62  lung]
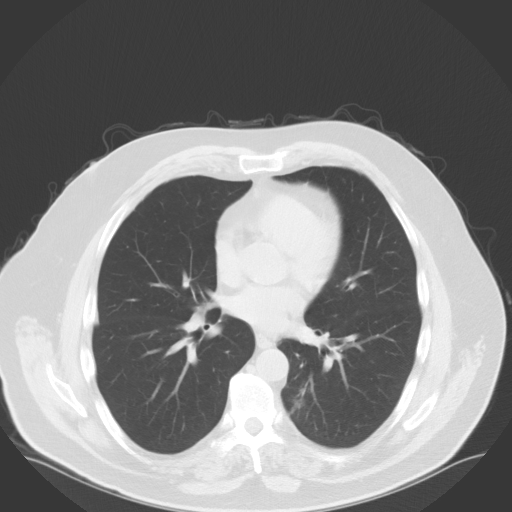
[im 34/62  lung]
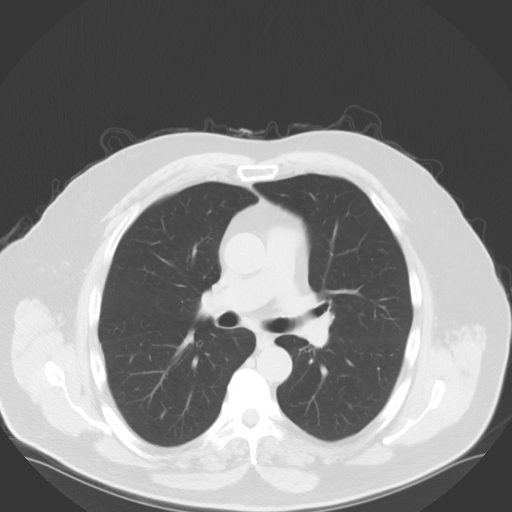
[im 39/62  lung]
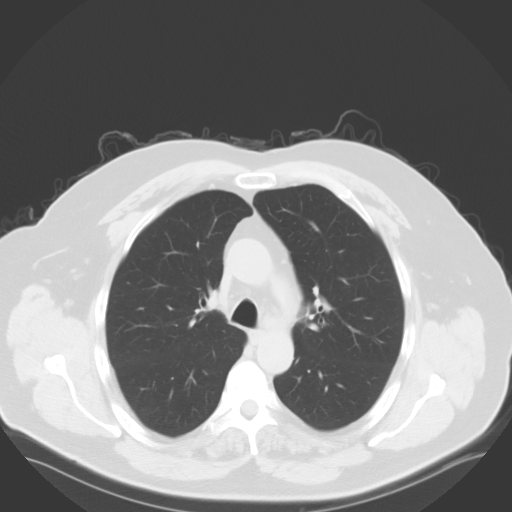
[im 43/62  mediastinal]
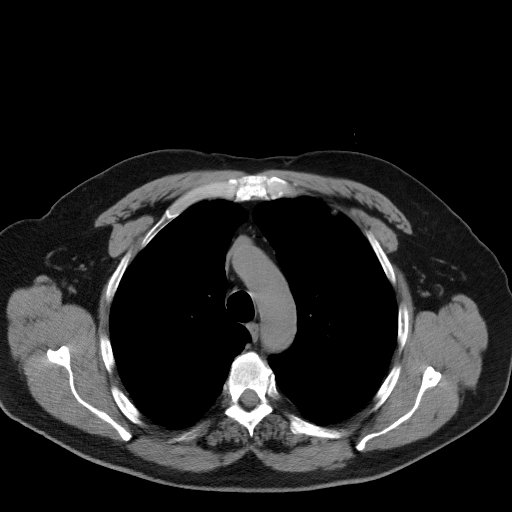
[im 43/62  lung]
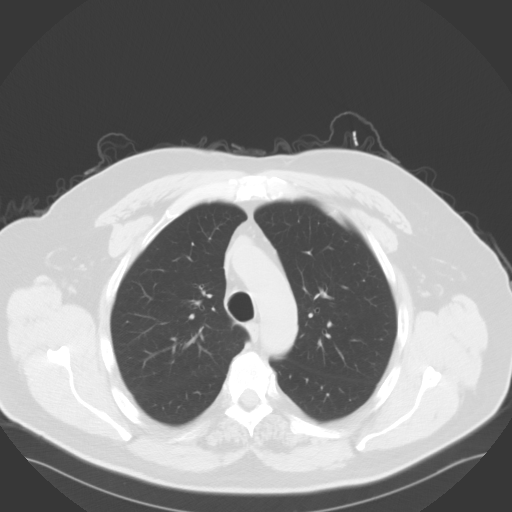
[im 48/62  lung]
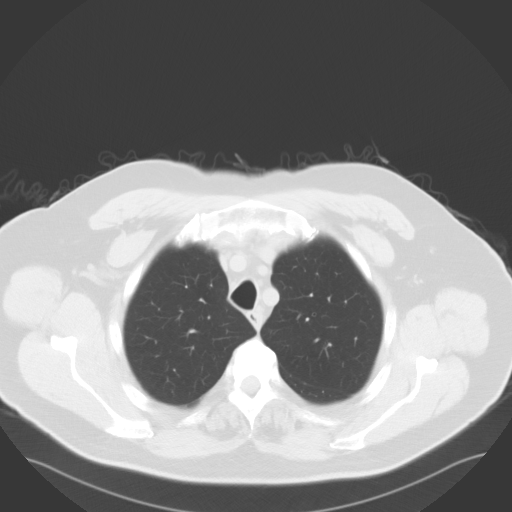
[im 52/62  lung]
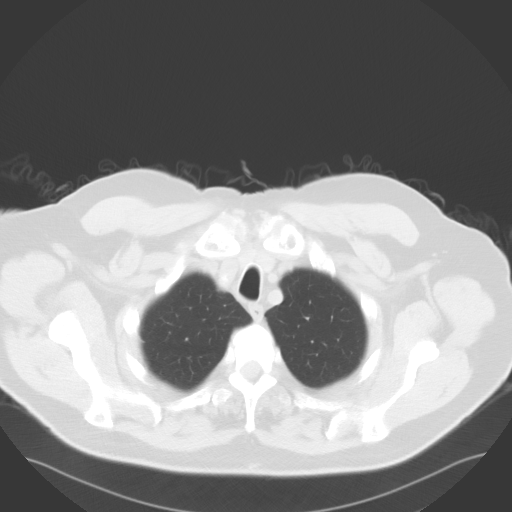
[im 57/62  lung]
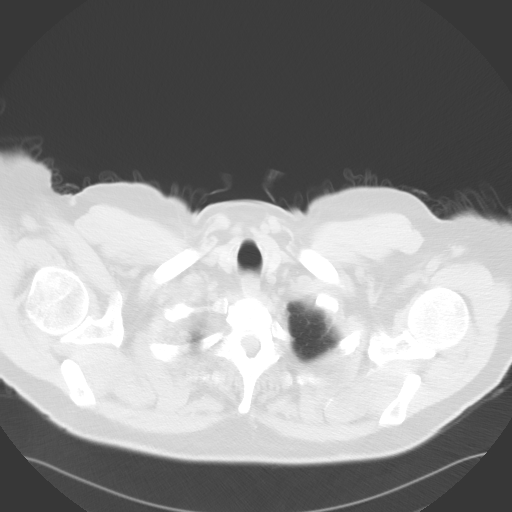

[Series 5: cor routine chest wo · coronal · 0.62mm/px · 3 of 156 slices shown]
[im 32/156  lung]
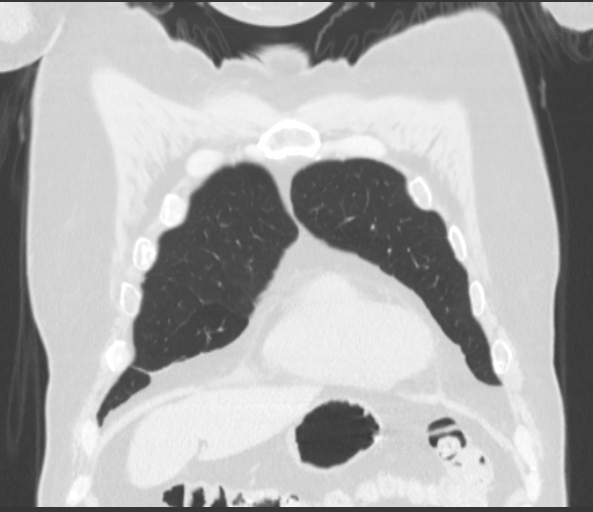
[im 63/156  lung]
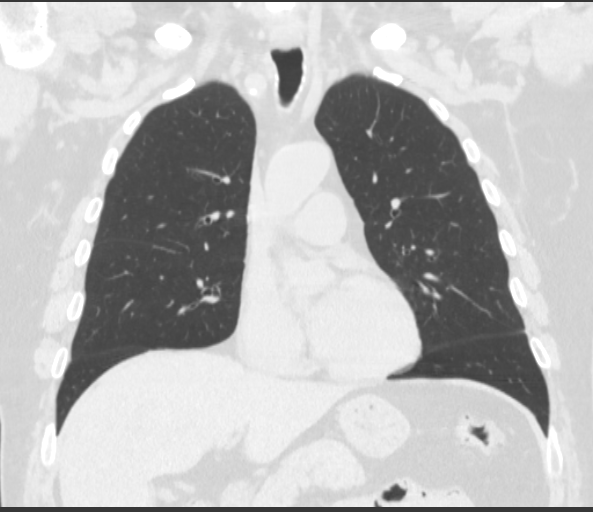
[im 94/156  lung]
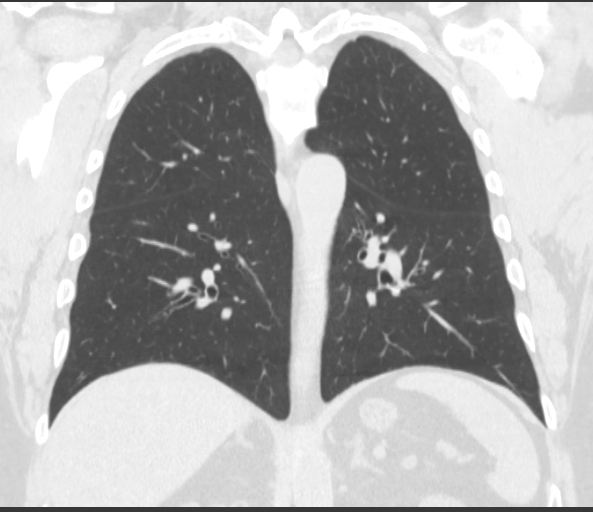

[15 of 36 positions shown; findings below may reference images not displayed]

FINDINGS: Mediastinum: Unremarkable CT appearance of the thyroid gland. No
suspicious mediastinal or hilar adenopathy. No soft tissue
mediastinal mass. The thoracic esophagus is unremarkable.

Heart/Vascular: Limited evaluation in the absence of intravenous
contrast. The right brachiocephalic and left common carotid artery
share a common origin. There is no aneurysmal dilatation of the
thoracic aorta. The pulmonary artery is within normal limits for
size. Calcified plaque is identified along the left anterior
descending coronary artery. Trace pericardial thickening versus
fluid anteriorly, likely of no clinical significance.

Lungs/Pleura: No evidence of pleural effusion. The masslike
consolidation in the medial aspect of the left lower lobe has
significantly decreased in size in the interim since the prior study
presently measuring 2.7 x 2.4 cm compared to 5.1 x 3.9 cm
previously. This interval reduction in size and volume is highly
suggestive of a resolving infectious/ inflammatory process. No new
pulmonary mass or nodule is identified. No evidence of emphysema or
other chronic lung disease.

Bones/Soft Tissues: No acute fracture or aggressive appearing lytic
or blastic osseous lesion.

Upper Abdomen: Unremarkable visualized upper abdomen within the
limitations of this non contrasted study.
IMPRESSION: 1. Significant interval reduction in size of the masslike
consolidation in the medial aspect of the left lower lobe compared
to 07/04/2014. An interval reduction in size is highly suggestive of
a benign process such as a resolving pneumonia.
2. Small focal calcified atherosclerotic plaque along the left
anterior descending coronary artery. Please note that although the
presence of coronary artery calcium documents the presence of
coronary artery disease, the severity of this disease and any
potential stenosis cannot be assessed on this non-gated CT
examination. Assessment for potential risk factor modification,
dietary therapy or pharmacologic therapy may be warranted, if
clinically indicated.

## 2016-05-26 ENCOUNTER — Other Ambulatory Visit
Admission: RE | Admit: 2016-05-26 | Discharge: 2016-05-26 | Disposition: A | Discharge status: Home / Self Care | Payer: Medicare Other | Source: Ambulatory Visit | Attending: Internal Medicine | Admitting: Internal Medicine

## 2016-05-26 DIAGNOSIS — Z0000 Encounter for general adult medical examination without abnormal findings: Secondary | ICD-10-CM | POA: Diagnosis not present

## 2016-05-26 LAB — CBC
HCT: 41.8 % (ref 40.0–52.0)
Hemoglobin: 14.3 g/dL (ref 13.0–18.0)
MCH: 32.3 pg (ref 26.0–34.0)
MCHC: 34.2 g/dL (ref 32.0–36.0)
MCV: 94.3 fL (ref 80.0–100.0)
Platelets: 243 10*3/uL (ref 150–440)
RBC: 4.43 MIL/uL (ref 4.40–5.90)
RDW: 13 % (ref 11.5–14.5)
WBC: 5.5 10*3/uL (ref 3.8–10.6)

## 2016-05-26 LAB — TSH: TSH: 1.195 u[IU]/mL (ref 0.350–4.500)

## 2016-05-26 LAB — BASIC METABOLIC PANEL
Anion gap: 4 — ABNORMAL LOW (ref 5–15)
BUN: 20 mg/dL (ref 6–20)
CO2: 29 mmol/L (ref 22–32)
Calcium: 8.4 mg/dL — ABNORMAL LOW (ref 8.9–10.3)
Chloride: 105 mmol/L (ref 101–111)
Creatinine, Ser: 0.97 mg/dL (ref 0.61–1.24)
GFR calc Af Amer: >60 mL/min (ref >60)
GFR calc non Af Amer: >60 mL/min (ref >60)
Glucose, Bld: 97 mg/dL (ref 65–99)
Potassium: 4.2 mmol/L (ref 3.5–5.1)
Sodium: 138 mmol/L (ref 135–145)

## 2016-05-26 LAB — LIPID PANEL
Cholesterol: 108 mg/dL (ref 0–200)
HDL: 42 mg/dL (ref >40)
LDL Cholesterol: 56 mg/dL (ref 0–99)
Total CHOL/HDL Ratio: 2.6 RATIO
Triglycerides: 49 mg/dL (ref <150)
VLDL: 10 mg/dL (ref 0–40)

## 2016-05-26 LAB — PSA: PSA: 0.79 ng/mL (ref 0.00–4.00)

## 2016-06-01 DIAGNOSIS — J449 Chronic obstructive pulmonary disease, unspecified: Secondary | ICD-10-CM | POA: Diagnosis not present

## 2016-06-01 DIAGNOSIS — I10 Essential (primary) hypertension: Secondary | ICD-10-CM | POA: Diagnosis not present

## 2016-06-01 DIAGNOSIS — H33009 Unspecified retinal detachment with retinal break, unspecified eye: Secondary | ICD-10-CM | POA: Diagnosis not present

## 2016-06-01 DIAGNOSIS — M7732 Calcaneal spur, left foot: Secondary | ICD-10-CM | POA: Diagnosis not present

## 2016-06-02 ENCOUNTER — Other Ambulatory Visit
Admission: RE | Admit: 2016-06-02 | Discharge: 2016-06-02 | Disposition: A | Discharge status: Home / Self Care | Payer: Medicare Other | Source: Ambulatory Visit | Attending: Internal Medicine | Admitting: Internal Medicine

## 2016-06-02 DIAGNOSIS — J449 Chronic obstructive pulmonary disease, unspecified: Secondary | ICD-10-CM | POA: Diagnosis not present

## 2016-06-02 DIAGNOSIS — H33009 Unspecified retinal detachment with retinal break, unspecified eye: Secondary | ICD-10-CM | POA: Diagnosis not present

## 2016-06-02 LAB — ALBUMIN: Albumin: 3.8 g/dL (ref 3.5–5.0)

## 2016-06-02 LAB — ELECTROLYTE PANEL
Anion gap: 4 — ABNORMAL LOW (ref 5–15)
CO2: 29 mmol/L (ref 22–32)
Chloride: 107 mmol/L (ref 101–111)
Potassium: 4.5 mmol/L (ref 3.5–5.1)
Sodium: 140 mmol/L (ref 135–145)

## 2016-06-02 LAB — TSH: TSH: 0.946 u[IU]/mL (ref 0.350–4.500)

## 2016-06-02 LAB — AST: AST: 24 U/L (ref 15–41)

## 2016-06-02 LAB — CALCIUM: Calcium: 8.9 mg/dL (ref 8.9–10.3)

## 2016-06-03 LAB — VITAMIN D 25 HYDROXY (VIT D DEFICIENCY, FRACTURES): Vit D, 25-Hydroxy: 35 ng/mL (ref 30.0–100.0)

## 2016-06-03 LAB — PARATHYROID HORMONE, INTACT (NO CA): PTH: 28 pg/mL (ref 15–65)

## 2016-06-16 ENCOUNTER — Ambulatory Visit (INDEPENDENT_AMBULATORY_CARE_PROVIDER_SITE_OTHER): Payer: Medicare Other

## 2016-06-16 ENCOUNTER — Ambulatory Visit (INDEPENDENT_AMBULATORY_CARE_PROVIDER_SITE_OTHER): Payer: Medicare Other | Admitting: Podiatry

## 2016-06-16 DIAGNOSIS — R52 Pain, unspecified: Secondary | ICD-10-CM

## 2016-06-16 DIAGNOSIS — L84 Corns and callosities: Secondary | ICD-10-CM | POA: Diagnosis not present

## 2016-06-16 DIAGNOSIS — M722 Plantar fascial fibromatosis: Secondary | ICD-10-CM

## 2016-06-16 DIAGNOSIS — M79672 Pain in left foot: Secondary | ICD-10-CM

## 2016-06-16 DIAGNOSIS — S9032XA Contusion of left foot, initial encounter: Secondary | ICD-10-CM

## 2016-06-16 DIAGNOSIS — M79671 Pain in right foot: Secondary | ICD-10-CM

## 2016-06-16 DIAGNOSIS — L851 Acquired keratosis [keratoderma] palmaris et plantaris: Secondary | ICD-10-CM

## 2016-06-16 MED ORDER — BETAMETHASONE SOD PHOS & ACET 6 (3-3) MG/ML IJ SUSP: 3.0000 mg | Freq: Once | INTRAMUSCULAR | Status: DC

## 2016-06-16 MED ORDER — MELOXICAM 15 MG PO TABS: 15.0000 mg | ORAL_TABLET | Freq: Every day | ORAL | 1 refills | Status: AC

## 2016-06-16 NOTE — Progress Notes (Addendum)
Subjective: Patient presents today for pain and tenderness in the left foot. Patient states the foot pain has been hurting for several weeks now. Patient states that it hurts in the mornings with the first steps out of bed. Patient also complains of a painful callus lesion to the weightbearing surface of the fifth MPJ right foot. Patient states that he's had a trimmed in the past and its alleviate symptoms. Patient presents today for further treatment and evaluation  Objective: Physical Exam General: The patient is alert and oriented x3 in no acute distress.  Dermatology: Hyperkeratotic callus formation noted to the weightbearing surface of the fifth MPJ right foot secondary to tailor's bunion. Skin is warm, dry and supple bilateral lower extremities. Negative for open lesions or macerations bilateral.   Vascular: Dorsalis Pedis and Posterior Tibial pulses palpable bilateral.  Capillary fill time is immediate to all digits.  Neurological: Epicritic and protective threshold intact bilateral.   Musculoskeletal: Tenderness to palpation at the medial calcaneal tubercale and through the insertion of the plantar fascia of the left foot. All other joints range of motion within normal limits bilateral. Strength 5/5 in all groups bilateral.   Radiographic exam: Normal osseous mineralization. Joint spaces preserved. No fracture/dislocation/boney destruction. Calcaneal spur present with mild thickening of plantar fascia left. No other soft tissue abnormalities or radiopaque foreign bodies.   Assessment: 1. Plantar fasciitis left foot 2. Plantar calcaneal bone bruise left foot 3. Callus fifth MPJ right foot 4. Pain in both feet  Plan of Care:  1. Patient evaluated. Xrays reviewed.   2. Injection of 0.5cc Celestone soluspan injected into the left plantar fascia.  3. Instructed patient regarding therapies and modalities at home to alleviate symptoms.  4. Rx for meloxicam given to patient.  5.  Plantar fascial band(s) dispensed. 6. Excisional debridement of painful callus lesion was performed using a chisel blade without incident. 7. Return to clinic in 4 weeks.

## 2016-07-17 ENCOUNTER — Ambulatory Visit (INDEPENDENT_AMBULATORY_CARE_PROVIDER_SITE_OTHER): Payer: Medicare Other | Admitting: Podiatry

## 2016-07-17 DIAGNOSIS — M79672 Pain in left foot: Secondary | ICD-10-CM

## 2016-07-17 DIAGNOSIS — M722 Plantar fascial fibromatosis: Secondary | ICD-10-CM

## 2016-07-17 MED ORDER — BETAMETHASONE SOD PHOS & ACET 6 (3-3) MG/ML IJ SUSP: 3.0000 mg | Freq: Once | INTRAMUSCULAR | Status: DC

## 2016-07-17 NOTE — Progress Notes (Signed)
Subjective: Patient presents today for follow-up evaluation of plantar fasciitis to the left foot. Patient states he believes he is approximately 80% better. Patient presents today for further treatment and evaluation  Objective: Physical Exam General: The patient is alert and oriented x3 in no acute distress.  Dermatology: Hyperkeratotic callus formation noted to the weightbearing surface of the fifth MPJ right foot secondary to tailor's bunion. Skin is warm, dry and supple bilateral lower extremities. Negative for open lesions or macerations bilateral.   Vascular: Dorsalis Pedis and Posterior Tibial pulses palpable bilateral.  Capillary fill time is immediate to all digits.  Neurological: Epicritic and protective threshold intact bilateral.   Musculoskeletal: Tenderness to palpation at the medial calcaneal tubercale and through the insertion of the plantar fascia of the left foot. All other joints range of motion within normal limits bilateral. Strength 5/5 in all groups bilateral.   Radiographic exam: Normal osseous mineralization. Joint spaces preserved. No fracture/dislocation/boney destruction. Calcaneal spur present with mild thickening of plantar fascia left. No other soft tissue abnormalities or radiopaque foreign bodies.   Assessment: 1. Plantar fasciitis left foot-improving 2. Plantar calcaneal bone bruise left foot-improving   Plan of Care:  1. Patient evaluated.  2. Injection of 0.5cc Celestone soluspan injected into the left plantar fascia.  3. Instructed patient regarding therapies and modalities at home to alleviate symptoms.  4. Continue meloxicam and plantar fascial band. 5. Recommend over-the-counter arch supports at [employer] 6. Return to clinic in 4 weeks

## 2016-08-17 DIAGNOSIS — H33009 Unspecified retinal detachment with retinal break, unspecified eye: Secondary | ICD-10-CM | POA: Diagnosis not present

## 2016-08-17 DIAGNOSIS — J449 Chronic obstructive pulmonary disease, unspecified: Secondary | ICD-10-CM | POA: Diagnosis not present

## 2016-08-17 DIAGNOSIS — J039 Acute tonsillitis, unspecified: Secondary | ICD-10-CM | POA: Diagnosis not present

## 2016-08-17 DIAGNOSIS — J029 Acute pharyngitis, unspecified: Secondary | ICD-10-CM | POA: Diagnosis not present

## 2016-08-18 ENCOUNTER — Ambulatory Visit (INDEPENDENT_AMBULATORY_CARE_PROVIDER_SITE_OTHER): Payer: Medicare Other | Admitting: Podiatry

## 2016-08-18 ENCOUNTER — Encounter: Payer: Self-pay | Admitting: Podiatry

## 2016-08-18 DIAGNOSIS — M79672 Pain in left foot: Secondary | ICD-10-CM

## 2016-08-18 DIAGNOSIS — M722 Plantar fascial fibromatosis: Secondary | ICD-10-CM

## 2016-08-18 NOTE — Progress Notes (Signed)
Subjective: Patient presents today for follow-up evaluation of plantar fasciitis to the left foot. Patient states that he is 100% improved. He is able to walk long distances without pain.   Objective: Physical Exam General: The patient is alert and oriented x3 in no acute distress.  Dermatology: Hyperkeratotic callus formation noted to the weightbearing surface of the fifth MPJ right foot secondary to tailor's bunion. Skin is warm, dry and supple bilateral lower extremities. Negative for open lesions or macerations bilateral.   Vascular: Dorsalis Pedis and Posterior Tibial pulses palpable bilateral.  Capillary fill time is immediate to all digits.  Neurological: Epicritic and protective threshold intact bilateral.   Musculoskeletal: Negative for Tenderness to palpation at the medial calcaneal tubercale and through the insertion of the plantar fascia of the left foot. All other joints range of motion within normal limits bilateral. Strength 5/5 in all groups bilateral.   Radiographic exam: Normal osseous mineralization. Joint spaces preserved. No fracture/dislocation/boney destruction. Calcaneal spur present with mild thickening of plantar fascia left. No other soft tissue abnormalities or radiopaque foreign bodies.   Assessment: 1. Plantar fasciitis left foot-resolved 2. Plantar calcaneal bone bruise left foot-resolved   Plan of Care:  1. Patient evaluated.  2. Continue over-the-counter insoles that the patient got [employer] 3. Continue plantar fascial band as needed 4. Continue meloxicam as needed 5. Return to clinic when necessary

## 2016-09-02 DIAGNOSIS — J449 Chronic obstructive pulmonary disease, unspecified: Secondary | ICD-10-CM | POA: Diagnosis not present

## 2016-09-02 DIAGNOSIS — R0789 Other chest pain: Secondary | ICD-10-CM | POA: Diagnosis not present

## 2016-09-08 DIAGNOSIS — J449 Chronic obstructive pulmonary disease, unspecified: Secondary | ICD-10-CM | POA: Diagnosis not present

## 2016-09-08 DIAGNOSIS — R079 Chest pain, unspecified: Secondary | ICD-10-CM | POA: Diagnosis not present

## 2016-09-08 DIAGNOSIS — H33009 Unspecified retinal detachment with retinal break, unspecified eye: Secondary | ICD-10-CM | POA: Diagnosis not present

## 2016-09-09 DIAGNOSIS — R079 Chest pain, unspecified: Secondary | ICD-10-CM | POA: Diagnosis not present

## 2016-09-09 DIAGNOSIS — H33009 Unspecified retinal detachment with retinal break, unspecified eye: Secondary | ICD-10-CM | POA: Diagnosis not present

## 2016-09-09 DIAGNOSIS — J449 Chronic obstructive pulmonary disease, unspecified: Secondary | ICD-10-CM | POA: Diagnosis not present

## 2016-10-09 ENCOUNTER — Other Ambulatory Visit: Payer: Self-pay | Admitting: *Deleted

## 2016-10-09 MED ORDER — MELOXICAM 15 MG PO TABS: 15.0000 mg | ORAL_TABLET | Freq: Every day | ORAL | 1 refills | Status: DC

## 2016-10-09 NOTE — Telephone Encounter (Signed)
Per Dr Amalia Hailey Refilled mobic 15 mg 30 tablets and one refill and sent to [facility] pharmacy. Lattie Haw

## 2016-10-15 ENCOUNTER — Telehealth: Payer: Self-pay | Admitting: *Deleted

## 2016-10-15 MED ORDER — MELOXICAM 15 MG PO TABS: 15.0000 mg | ORAL_TABLET | Freq: Every day | ORAL | 3 refills | Status: DC

## 2016-10-15 NOTE — Telephone Encounter (Signed)
Received refill request for Meloxicam. Dr. Amalia Hailey ordered refill +3additional refills, needs an appt prior to future refills.

## 2016-12-10 DIAGNOSIS — J449 Chronic obstructive pulmonary disease, unspecified: Secondary | ICD-10-CM | POA: Diagnosis not present

## 2016-12-10 DIAGNOSIS — H33009 Unspecified retinal detachment with retinal break, unspecified eye: Secondary | ICD-10-CM | POA: Diagnosis not present

## 2016-12-10 DIAGNOSIS — I10 Essential (primary) hypertension: Secondary | ICD-10-CM | POA: Diagnosis not present

## 2016-12-22 ENCOUNTER — Ambulatory Visit (INDEPENDENT_AMBULATORY_CARE_PROVIDER_SITE_OTHER): Payer: Medicare Other | Admitting: Podiatry

## 2016-12-22 ENCOUNTER — Encounter: Payer: Self-pay | Admitting: Podiatry

## 2016-12-22 DIAGNOSIS — M722 Plantar fascial fibromatosis: Secondary | ICD-10-CM | POA: Diagnosis not present

## 2016-12-22 DIAGNOSIS — Q828 Other specified congenital malformations of skin: Secondary | ICD-10-CM

## 2016-12-30 ENCOUNTER — Encounter: Payer: Self-pay | Admitting: General Surgery

## 2016-12-30 ENCOUNTER — Ambulatory Visit (INDEPENDENT_AMBULATORY_CARE_PROVIDER_SITE_OTHER): Payer: Medicare Other | Admitting: General Surgery

## 2016-12-30 VITALS — BP 130/72 | HR 65 | Resp 14 | Ht 72.0 in | Wt 239.0 lb

## 2016-12-30 DIAGNOSIS — I8393 Asymptomatic varicose veins of bilateral lower extremities: Secondary | ICD-10-CM | POA: Diagnosis not present

## 2016-12-30 NOTE — Progress Notes (Signed)
Subjective: Patient presents today for follow-up evaluation of plantar fasciitis to the left foot. Patient states he is beginning to experience pain in the left foot and heel again. He also reports a painful corn on the lateral side of the forefoot. Wearing the plantar fascial brace and taking meloxicam helped to alleviate the pain. He denies any other modifying factors. He is here for further evaluation and treatment.  Objective: Physical Exam General: The patient is alert and oriented x3 in no acute distress.  Dermatology: Hyperkeratotic callus formation noted to the weightbearing surface of the fifth MPJ right foot secondary to tailor's bunion. Skin is warm, dry and supple bilateral lower extremities. Negative for open lesions or macerations bilateral.   Vascular: Dorsalis Pedis and Posterior Tibial pulses palpable bilateral.  Capillary fill time is immediate to all digits.  Neurological: Epicritic and protective threshold intact bilateral.   Musculoskeletal: Negative for Tenderness to palpation at the medial calcaneal tubercale and through the insertion of the plantar fascia of the left foot. All other joints range of motion within normal limits bilateral. Strength 5/5 in all groups bilateral.    Assessment: 1. Plantar fasciitis left foot 2. Porokeratosis right lateral forefoot   Plan of Care:  1. Patient evaluated.  2. Injection of 0.5cc Celestone soluspan injected into the left plantar fascia. 3. Excisional debridement of keratoic lesion using a chisel blade was performed without incident.  4. Treated area(s) with Salinocaine and dressed with light dressing. 5. Continue OTC insoles and plantar fascial brace. 6. Return to clinic when necessary.

## 2016-12-30 NOTE — Progress Notes (Signed)
Patient ID: Paul Hews., male   DOB: 1945/07/20, 71 y.o.   MRN: [id number]  Chief Complaint  Patient presents with  . Varicose Veins    HPI Paul Dobbs. is a 71 y.o. male here today for his follow up of bilateral Varicose veins. Patient states he is doing well. Denies any leg symptoms.  He continues to wear compression hose, although does not wear them regularly. HPI  Past Medical History:  Diagnosis Date  . GERD (gastroesophageal reflux disease)     Past Surgical History:  Procedure Laterality Date  . APPENDECTOMY  1963  . EYE SURGERY  2013  . HERNIA REPAIR  1986    No family history on file.  Social History Social History  Substance Use Topics  . Smoking status: Never Smoker  . Smokeless tobacco: Never Used  . Alcohol use Yes    No Known Allergies  Current Outpatient Prescriptions  Medication Sig Dispense Refill  . aspirin 81 MG tablet Take 81 mg by mouth once a week.     [name] atorvastatin (LIPITOR) 20 MG tablet Take 20 mg by mouth daily.  12  . meloxicam (MOBIC) 15 MG tablet Take 1 tablet (15 mg total) by mouth daily. 60 tablet 3  . ranitidine (ZANTAC) 150 MG capsule Take 150 mg by mouth as needed.      Current Facility-Administered Medications  Medication Dose Route Frequency Provider Last Rate Last Dose  . betamethasone acetate-betamethasone sodium phosphate (CELESTONE) injection 3 mg  3 mg Intramuscular Once Daylene Katayama M, DPM      . betamethasone acetate-betamethasone sodium phosphate (CELESTONE) injection 3 mg  3 mg Intramuscular Once Edrick Kins, DPM        Review of Systems Review of Systems  Respiratory: Negative.   Cardiovascular: Negative.     Blood pressure 130/72, pulse 65, resp. rate 14, height 6' (1.829 m), weight 239 lb (108.4 kg).  Physical Exam Physical Exam  Constitutional: He is oriented to person, place, and time. He appears well-developed and well-nourished.  HENT:  Mouth/Throat: No oropharyngeal exudate.  Eyes:  Conjunctivae are normal. No scleral icterus.  Neck: Neck supple.  Cardiovascular: Normal rate, regular rhythm, normal heart sounds and intact distal pulses.   Pulses:      Dorsalis pedis pulses are 2+ on the right side, and 2+ on the left side.       Posterior tibial pulses are 2+ on the right side, and 2+ on the left side.  Mild-moderate lower extremity edema secondary to presence of varicose veins. No skin changes  Pulmonary/Chest: Effort normal and breath sounds normal.  Abdominal: Soft. Normal appearance and bowel sounds are normal. There is no hepatomegaly. No hernia.  Lymphadenopathy:    He has no cervical adenopathy.  Neurological: He is alert and oriented to person, place, and time.  Skin: Skin is warm and dry.  Psychiatric: He has a normal mood and affect. His behavior is normal.    Data Reviewed Prior notes reviewed  Assessment    Varicose veins- Lower extremities still exhibit mild-mod swelling, but no pain, stasis, etc. Recommended pt to wear compressing stockings more regularly and more often. Advised against long periods of immobility or standing, discussed risks of blood clots, ulcers, and other complications of vv. Stable exam otherwise.    Plan    Patient to return as needed. Advised to call office with questions or concerns.     HPI, Physical Exam, Assessment and Plan have been scribed  under the direction and in the presence of Mckinley Jewel, MD  Gaspar Cola, CMA  I have completed the exam and reviewed the above documentation for accuracy and completeness.  I agree with the above.  Haematologist has been used and any errors in dictation or transcription are unintentional.  Seeplaputhur G. Jamal Collin, M.D., F.A.C.S.   Junie Panning G 12/30/2016, 12:50 PM

## 2016-12-30 NOTE — Patient Instructions (Addendum)
Pt to return as needed. Advised to call office with questions or concerns.

## 2017-01-04 MED ORDER — BETAMETHASONE SOD PHOS & ACET 6 (3-3) MG/ML IJ SUSP: 3.0000 mg | Freq: Once | INTRAMUSCULAR | Status: DC

## 2017-03-08 DIAGNOSIS — H2512 Age-related nuclear cataract, left eye: Secondary | ICD-10-CM | POA: Diagnosis not present

## 2017-04-14 DIAGNOSIS — Z23 Encounter for immunization: Secondary | ICD-10-CM | POA: Diagnosis not present

## 2017-05-26 DIAGNOSIS — I10 Essential (primary) hypertension: Secondary | ICD-10-CM | POA: Diagnosis not present

## 2017-05-26 DIAGNOSIS — R5381 Other malaise: Secondary | ICD-10-CM | POA: Diagnosis not present

## 2017-05-26 DIAGNOSIS — E7849 Other hyperlipidemia: Secondary | ICD-10-CM | POA: Diagnosis not present

## 2017-06-01 DIAGNOSIS — H33009 Unspecified retinal detachment with retinal break, unspecified eye: Secondary | ICD-10-CM | POA: Diagnosis not present

## 2017-06-01 DIAGNOSIS — Z008 Encounter for other general examination: Secondary | ICD-10-CM | POA: Diagnosis not present

## 2017-06-01 DIAGNOSIS — J449 Chronic obstructive pulmonary disease, unspecified: Secondary | ICD-10-CM | POA: Diagnosis not present

## 2017-06-01 DIAGNOSIS — Z0000 Encounter for general adult medical examination without abnormal findings: Secondary | ICD-10-CM | POA: Diagnosis not present

## 2017-11-26 ENCOUNTER — Emergency Department: Payer: Medicare Other

## 2017-11-26 ENCOUNTER — Emergency Department
Admission: EM | Admit: 2017-11-26 | Discharge: 2017-11-26 | Disposition: A | Discharge status: Home / Self Care | Payer: Medicare Other | Attending: Emergency Medicine | Admitting: Emergency Medicine

## 2017-11-26 ENCOUNTER — Encounter: Payer: Self-pay | Admitting: *Deleted

## 2017-11-26 ENCOUNTER — Other Ambulatory Visit: Payer: Self-pay

## 2017-11-26 DIAGNOSIS — M25561 Pain in right knee: Secondary | ICD-10-CM | POA: Diagnosis not present

## 2017-11-26 DIAGNOSIS — Y999 Unspecified external cause status: Secondary | ICD-10-CM | POA: Insufficient documentation

## 2017-11-26 DIAGNOSIS — Z79899 Other long term (current) drug therapy: Secondary | ICD-10-CM | POA: Insufficient documentation

## 2017-11-26 DIAGNOSIS — Z7982 Long term (current) use of aspirin: Secondary | ICD-10-CM | POA: Insufficient documentation

## 2017-11-26 DIAGNOSIS — W231XXA Caught, crushed, jammed, or pinched between stationary objects, initial encounter: Secondary | ICD-10-CM | POA: Diagnosis not present

## 2017-11-26 DIAGNOSIS — Y9353 Activity, golf: Secondary | ICD-10-CM | POA: Diagnosis not present

## 2017-11-26 DIAGNOSIS — Y9239 Other specified sports and athletic area as the place of occurrence of the external cause: Secondary | ICD-10-CM | POA: Insufficient documentation

## 2017-11-26 DIAGNOSIS — S8991XA Unspecified injury of right lower leg, initial encounter: Secondary | ICD-10-CM | POA: Diagnosis not present

## 2017-11-26 NOTE — ED Triage Notes (Signed)
Pt reports climbing out of a sand trap today and felt something pull in his right knee and has since had increased pain when putting weight on knee. Pt denies pain while sitting in wheelchair. No swelling or deformity noted. Color is appropriate.

## 2017-11-26 NOTE — ED Provider Notes (Addendum)
[facility] Emergency Department Provider Note  ____________________________________________  Time seen: Approximately 8:45 PM  I have reviewed the triage vital signs and the nursing notes.   HISTORY  Chief Complaint Knee Pain    HPI Paul Buckley. is a 72 y.o. male that presents to the emergency department for evaluation of right knee pain after injury this morning.  Patient was golfing when his foot got stuck in a sand trap.  He felt something pop on the outside.  Pain is only when he bears weight on knee.  He does not have any pain with sitting.  No numbness, tingling.   Past Medical History:  Diagnosis Date  . GERD (gastroesophageal reflux disease)     Patient Active Problem List   Diagnosis Date Noted  . Varicose veins of lower extremities with other complications 14/48/1856    Past Surgical History:  Procedure Laterality Date  . APPENDECTOMY  1963  . EYE SURGERY  2013  . [place]    Prior to Admission medications   Medication Sig Start Date End Date Taking? Authorizing Provider  aspirin 81 MG tablet Take 81 mg by mouth once a week.     [provider]  atorvastatin (LIPITOR) 20 MG tablet Take 20 mg by mouth daily. 11/11/14   [provider]  meloxicam (MOBIC) 15 MG tablet Take 1 tablet (15 mg total) by mouth daily. 10/15/16   Edrick Kins, DPM  ranitidine (ZANTAC) 150 MG capsule Take 150 mg by mouth as needed.     [provider]    Allergies Patient has no known allergies.  History reviewed. No pertinent family history.  Social History Social History   Tobacco Use  . Smoking status: Never Smoker  . Smokeless tobacco: Never Used  Substance Use Topics  . Alcohol use: Yes  . Drug use: No     Review of Systems  Gastrointestinal: No nausea, no vomiting.  Musculoskeletal: Positive for knee pain.  Skin: Negative for rash, abrasions, lacerations, ecchymosis. Neurological: Negative for  numbness or tingling   ____________________________________________   PHYSICAL EXAM:  VITAL SIGNS: ED Triage Vitals  Enc Vitals Group     BP 11/26/17 1923 119/70     Pulse Rate 11/26/17 1923 90     Resp 11/26/17 1923 16     Temp 11/26/17 1923 98.3 F (36.8 C)     Temp Source 11/26/17 1923 Oral     SpO2 11/26/17 1923 94 %     Weight 11/26/17 1924 230 lb (104.3 kg)     Height 11/26/17 1924 6' (1.829 m)     Head Circumference --      Peak Flow --      Pain Score 11/26/17 1924 0     Pain Loc --      Pain Edu? --      Excl. in [place]? --      Constitutional: Alert and oriented. Well appearing and in no acute distress. Eyes: Conjunctivae are normal. PERRL. EOMI. Head: Atraumatic. ENT:      Ears:      Nose: No congestion/rhinnorhea.      Mouth/Throat: Mucous membranes are moist.  Neck: No stridor.   Cardiovascular: Normal rate, regular rhythm.  Good peripheral circulation. Respiratory: Normal respiratory effort without tachypnea or retractions. Lungs CTAB. Good air entry to the bases with no decreased or absent breath sounds. Musculoskeletal: Full range of motion to all extremities. No gross deformities appreciated. No tenderness to  palpation. No effusion noted. Negative anterior drawer, posterior drawer, valgus, varus, mcMurray, patella apprehension, apley grind. Neurologic:  Normal speech and language. No gross focal neurologic deficits are appreciated.  Skin:  Skin is warm, dry and intact. No rash noted.   ____________________________________________   LABS (all labs ordered are listed, but only abnormal results are displayed)  Labs Reviewed - No data to display ____________________________________________  EKG   ____________________________________________  RADIOLOGY Robinette Haines, personally viewed and evaluated these images (plain radiographs) as part of my medical decision making, as well as reviewing the written report by the radiologist.  Dg Knee Complete  4 Views Right  Result Date: 11/26/2017 CLINICAL DATA:  RIGHT knee injury, pain. EXAM: RIGHT KNEE - COMPLETE 4+ VIEW COMPARISON:  None. FINDINGS: No evidence of fracture, dislocation, or joint effusion. No evidence of arthropathy or other focal bone abnormality. Soft tissues are unremarkable. IMPRESSION: Negative. Electronically Signed   By: Franki Cabot M.D.   On: 11/26/2017 21:10    ____________________________________________    PROCEDURES  Procedure(s) performed:    Procedures    Medications - No data to display   ____________________________________________   INITIAL IMPRESSION / ASSESSMENT AND PLAN / ED COURSE  Pertinent labs & imaging results that were available during my care of the patient were reviewed by me and considered in my medical decision making (see chart for details).  Review of the [place] CSRS was performed in accordance of the [place] prior to dispensing any controlled drugs.   Patient presented to the emergency department for evaluation of knee pain after injury. Vital signs and exam are reassuring.  X-ray negative for acute bony abnormalities. I suspect meniscal injury. Knee was ace wrapped. He has crutches and a walker already. Patient will begin ibuprofen. Patient is to follow up with PCP as directed. He has an appointment on Tuesday. Patient is given ED precautions to return to the ED for any worsening or new symptoms.     ____________________________________________  FINAL CLINICAL IMPRESSION(S) / ED DIAGNOSES  Final diagnoses:  Acute pain of right knee      NEW MEDICATIONS STARTED DURING THIS VISIT:  ED Discharge Orders    None          This chart was dictated using voice recognition software/Dragon. Despite best efforts to proofread, errors can occur which can change the meaning. Any change was purely unintentional.    Laban Emperor, PA-C 11/26/17 2356    Laban Emperor, PA-C 11/26/17 2356    Schuyler Amor, MD 11/27/17  0001

## 2017-11-29 DIAGNOSIS — E669 Obesity, unspecified: Secondary | ICD-10-CM | POA: Diagnosis not present

## 2017-11-29 DIAGNOSIS — H33009 Unspecified retinal detachment with retinal break, unspecified eye: Secondary | ICD-10-CM | POA: Diagnosis not present

## 2017-11-29 DIAGNOSIS — M25461 Effusion, right knee: Secondary | ICD-10-CM | POA: Diagnosis not present

## 2017-11-29 DIAGNOSIS — J449 Chronic obstructive pulmonary disease, unspecified: Secondary | ICD-10-CM | POA: Diagnosis not present

## 2017-12-09 DIAGNOSIS — S8391XA Sprain of unspecified site of right knee, initial encounter: Secondary | ICD-10-CM | POA: Diagnosis not present

## 2018-01-03 ENCOUNTER — Ambulatory Visit (INDEPENDENT_AMBULATORY_CARE_PROVIDER_SITE_OTHER): Payer: Medicare Other | Admitting: Vascular Surgery

## 2018-01-03 ENCOUNTER — Encounter (INDEPENDENT_AMBULATORY_CARE_PROVIDER_SITE_OTHER): Payer: Self-pay | Admitting: Vascular Surgery

## 2018-01-03 VITALS — BP 112/65 | HR 70 | Resp 17 | Ht 72.0 in | Wt 232.8 lb

## 2018-01-03 DIAGNOSIS — I83893 Varicose veins of bilateral lower extremities with other complications: Secondary | ICD-10-CM

## 2018-01-03 DIAGNOSIS — I10 Essential (primary) hypertension: Secondary | ICD-10-CM

## 2018-01-03 DIAGNOSIS — E785 Hyperlipidemia, unspecified: Secondary | ICD-10-CM

## 2018-01-03 NOTE — Progress Notes (Signed)
Subjective:    Patient ID: Paul Hews., male    DOB: 16-Jun-1946, 72 y.o.   MRN: [id number] Chief Complaint  Patient presents with  . New Patient (Initial Visit)    ref [facility] for Varicose Veins   Presents as a new patient referred by Dr. Lavera Guise for evaluation of painful lower extremity varicose veins.  The patient states a long-standing history of painful varicosities noted to the bilateral legs.  The patient was seen by Dr. Kirtland Bouchard (who has retired) and is looking to establish care.  The patient has been engaging in conservative therapy for years.  The patient wears medical grade 1 compression socks, elevates his legs and remains active on a daily basis however the patient notes the pain along his varicosities have worsened over the last few months.  The patient notes that his discomfort is worse towards the end of the day or with sitting and standing for long periods of time.  The patient experiences minimal swelling to the lower extremity.  The swelling also causes some discomfort.  The patient denies any recent surgery or trauma to the bilateral lower extremity.  The patient denies any DVT history to the bilateral lower extremity.  The patient does experience left knee/calf pain with ambulation.  The patient denies any rest pain or ulceration to the bilateral lower extremity.  Patient denies any erythema to the bilateral lower cavity.  Patient denies any fever, nausea vomiting.  Review of Systems  Constitutional: Negative.   HENT: Negative.   Eyes: Negative.   Respiratory: Negative.   Cardiovascular:       Painful varicose veins  Gastrointestinal: Negative.   Endocrine: Negative.   Genitourinary: Negative.   Musculoskeletal: Negative.   Skin: Negative.   Allergic/Immunologic: Negative.   Neurological: Negative.   Hematological: Negative.   Psychiatric/Behavioral: Negative.       Objective:   Physical Exam  Constitutional: He is oriented to person, place, and time. He appears  well-developed and well-nourished. No distress.  HENT:  Head: Normocephalic and atraumatic.  Right Ear: External ear normal.  Left Ear: External ear normal.  Eyes: Pupils are equal, round, and reactive to light. Conjunctivae and EOM are normal.  Neck: Normal range of motion.  Cardiovascular: Normal rate, regular rhythm, normal heart sounds and intact distal pulses.  Pulses:      Radial pulses are 2+ on the right side, and 2+ on the left side.       Dorsalis pedis pulses are 1+ on the right side, and 1+ on the left side.       Posterior tibial pulses are 1+ on the right side, and 1+ on the left side.  Pulmonary/Chest: Effort normal and breath sounds normal.  Musculoskeletal: Normal range of motion. He exhibits no edema.  Neurological: He is alert and oriented to person, place, and time.  Skin: He is not diaphoretic.  Mild stasis dermatitis noted bilaterally.  There is no fibrosis, cellulitis, active ulcerations at this time.  The patient does have greater than 1 cm and less than 1 cm diffuse varicosities noted to the bilateral lower extremity.  Psychiatric: He has a normal mood and affect. His behavior is normal. Judgment and thought content normal.  Vitals reviewed.  BP 112/65 (BP Location: Right Arm)   Pulse 70   Resp 17   Ht 6' (1.829 m)   Wt 232 lb 12.8 oz (105.6 kg)   BMI 31.57 kg/m   Past Medical History:  Diagnosis Date  .  GERD (gastroesophageal reflux disease)    Social History   Socioeconomic History  . Marital status: Married    Spouse name: Not on file  . Number of children: Not on file  . Years of education: Not on file  . Highest education level: Not on file  Occupational History  . Not on file  Social Needs  . Financial resource strain: Not on file  . Food insecurity:    Worry: Not on file    Inability: Not on file  . Transportation needs:    Medical: Not on file    Non-medical: Not on file  Tobacco Use  . Smoking status: Never Smoker  . Smokeless  tobacco: Never Used  Substance and Sexual Activity  . Alcohol use: Yes  . Drug use: No  . Sexual activity: Not on file  Lifestyle  . Physical activity:    Days per week: Not on file    Minutes per session: Not on file  . Stress: Not on file  Relationships  . Social connections:    Talks on phone: Not on file    Gets together: Not on file    Attends religious service: Not on file    Active member of club or organization: Not on file    Attends meetings of clubs or organizations: Not on file    Relationship status: Not on file  . Intimate partner violence:    Fear of current or ex partner: Not on file    Emotionally abused: Not on file    Physically abused: Not on file    Forced sexual activity: Not on file  Other Topics Concern  . Not on file  Social History Narrative  . Not on file   Past Surgical History:  Procedure Laterality Date  . APPENDECTOMY  1963  . EYE SURGERY  2013  . HERNIA REPAIR  1986   Family History  Problem Relation Age of Onset  . Cancer Mother   . Cancer Father   . Cancer Maternal Uncle   . Cancer Paternal Uncle   . Heart attack Paternal Grandfather    No Known Allergies     Assessment & Plan:  Presents as a new patient referred by Dr. Lavera Guise for evaluation of painful lower extremity varicose veins.  The patient states a long-standing history of painful varicosities noted to the bilateral legs.  The patient was seen by Dr. Kirtland Bouchard (who has retired) and is looking to establish care.  The patient has been engaging in conservative therapy for years.  The patient wears medical grade 1 compression socks, elevates his legs and remains active on a daily basis however the patient notes the pain along his varicosities have worsened over the last few months.  The patient notes that his discomfort is worse towards the end of the day or with sitting and standing for long periods of time.  The patient experiences minimal swelling to the lower extremity.  The  swelling also causes some discomfort.  The patient denies any recent surgery or trauma to the bilateral lower extremity.  The patient denies any DVT history to the bilateral lower extremity.  The patient does experience left knee/calf pain with ambulation.  The patient denies any rest pain or ulceration to the bilateral lower extremity.  Patient denies any erythema to the bilateral lower cavity.  Patient denies any fever, nausea vomiting.  1. Varicose veins of bilateral lower extremities with other complications - New The patient was encouraged to continue wearing  graduated compression stockings (20-30 mmHg) on a daily basis. The patient was instructed to begin wearing the stockings first thing in the morning and removing them in the evening. The patient was instructed specifically not to sleep in the stockings. New prescription given. In addition, behavioral modification including elevation during the day will be initiated. Anti-inflammatories for pain. I will bring the patient back and have him undergo a bilateral lower extremity venous duplex to rule out any reflux versus contributing lymphatic disease. The patient will follow up in three months to asses conservative management.  Information on compression stockings was given to the patient. The patient was instructed to call the office in the interim if any worsening edema or ulcerations to the legs, feet or toes occurs. The patient expresses their understanding.  - VAS [place] LOWER EXTREMITY VENOUS REFLUX; Future - VAS [place] ABI WITH/WO TBI; Future  2. Hyperlipidemia, unspecified hyperlipidemia type - Stable Encouraged good control as its slows the progression of atherosclerotic disease  3. Essential hypertension - Stable Encouraged good control as its slows the progression of atherosclerotic disease  Current Outpatient Medications on File Prior to Visit  Medication Sig Dispense Refill  . aspirin 81 MG tablet Take 81 mg by mouth once a week.       [name] atorvastatin (LIPITOR) 20 MG tablet Take 20 mg by mouth daily.  12  . losartan-hydrochlorothiazide (HYZAAR) 100-12.5 MG tablet Take 1 tablet by mouth daily.  3  . meloxicam (MOBIC) 15 MG tablet Take 1 tablet (15 mg total) by mouth daily. (Patient taking differently: Take 15 mg by mouth as needed. ) 60 tablet 3  . ranitidine (ZANTAC) 150 MG capsule Take 150 mg by mouth as needed.      Current Facility-Administered Medications on File Prior to Visit  Medication Dose Route Frequency Provider Last Rate Last Dose  . betamethasone acetate-betamethasone sodium phosphate (CELESTONE) injection 3 mg  3 mg Intramuscular Once Daylene Katayama M, DPM      . betamethasone acetate-betamethasone sodium phosphate (CELESTONE) injection 3 mg  3 mg Intramuscular Once Daylene Katayama M, DPM      . betamethasone acetate-betamethasone sodium phosphate (CELESTONE) injection 3 mg  3 mg Intramuscular Once Edrick Kins, DPM       There are no Patient Instructions on file for this visit. No follow-ups on file.  Blandon Offerdahl A Charmin Aguiniga, PA-C

## 2018-02-02 DIAGNOSIS — M5136 Other intervertebral disc degeneration, lumbar region: Secondary | ICD-10-CM | POA: Diagnosis not present

## 2018-02-02 DIAGNOSIS — M9905 Segmental and somatic dysfunction of pelvic region: Secondary | ICD-10-CM | POA: Diagnosis not present

## 2018-02-02 DIAGNOSIS — M955 Acquired deformity of pelvis: Secondary | ICD-10-CM | POA: Diagnosis not present

## 2018-02-02 DIAGNOSIS — M9903 Segmental and somatic dysfunction of lumbar region: Secondary | ICD-10-CM | POA: Diagnosis not present

## 2018-02-04 DIAGNOSIS — M9905 Segmental and somatic dysfunction of pelvic region: Secondary | ICD-10-CM | POA: Diagnosis not present

## 2018-02-04 DIAGNOSIS — M9903 Segmental and somatic dysfunction of lumbar region: Secondary | ICD-10-CM | POA: Diagnosis not present

## 2018-02-04 DIAGNOSIS — M955 Acquired deformity of pelvis: Secondary | ICD-10-CM | POA: Diagnosis not present

## 2018-02-04 DIAGNOSIS — M5136 Other intervertebral disc degeneration, lumbar region: Secondary | ICD-10-CM | POA: Diagnosis not present

## 2018-02-09 DIAGNOSIS — M9905 Segmental and somatic dysfunction of pelvic region: Secondary | ICD-10-CM | POA: Diagnosis not present

## 2018-02-09 DIAGNOSIS — M5136 Other intervertebral disc degeneration, lumbar region: Secondary | ICD-10-CM | POA: Diagnosis not present

## 2018-02-09 DIAGNOSIS — M9903 Segmental and somatic dysfunction of lumbar region: Secondary | ICD-10-CM | POA: Diagnosis not present

## 2018-02-09 DIAGNOSIS — M955 Acquired deformity of pelvis: Secondary | ICD-10-CM | POA: Diagnosis not present

## 2018-02-10 ENCOUNTER — Other Ambulatory Visit: Payer: Self-pay

## 2018-02-10 DIAGNOSIS — D1801 Hemangioma of skin and subcutaneous tissue: Secondary | ICD-10-CM | POA: Diagnosis not present

## 2018-02-10 DIAGNOSIS — L820 Inflamed seborrheic keratosis: Secondary | ICD-10-CM | POA: Diagnosis not present

## 2018-02-10 DIAGNOSIS — Z1283 Encounter for screening for malignant neoplasm of skin: Secondary | ICD-10-CM | POA: Diagnosis not present

## 2018-02-10 DIAGNOSIS — L719 Rosacea, unspecified: Secondary | ICD-10-CM | POA: Diagnosis not present

## 2018-02-10 DIAGNOSIS — L578 Other skin changes due to chronic exposure to nonionizing radiation: Secondary | ICD-10-CM | POA: Diagnosis not present

## 2018-02-10 DIAGNOSIS — L821 Other seborrheic keratosis: Secondary | ICD-10-CM | POA: Diagnosis not present

## 2018-02-10 DIAGNOSIS — L570 Actinic keratosis: Secondary | ICD-10-CM | POA: Diagnosis not present

## 2018-02-10 DIAGNOSIS — I781 Nevus, non-neoplastic: Secondary | ICD-10-CM | POA: Diagnosis not present

## 2018-02-10 DIAGNOSIS — D225 Melanocytic nevi of trunk: Secondary | ICD-10-CM | POA: Diagnosis not present

## 2018-02-10 DIAGNOSIS — L814 Other melanin hyperpigmentation: Secondary | ICD-10-CM | POA: Diagnosis not present

## 2018-02-10 DIAGNOSIS — I8393 Asymptomatic varicose veins of bilateral lower extremities: Secondary | ICD-10-CM | POA: Diagnosis not present

## 2018-02-15 DIAGNOSIS — M5136 Other intervertebral disc degeneration, lumbar region: Secondary | ICD-10-CM | POA: Diagnosis not present

## 2018-02-15 DIAGNOSIS — M955 Acquired deformity of pelvis: Secondary | ICD-10-CM | POA: Diagnosis not present

## 2018-02-15 DIAGNOSIS — M9905 Segmental and somatic dysfunction of pelvic region: Secondary | ICD-10-CM | POA: Diagnosis not present

## 2018-02-15 DIAGNOSIS — M9903 Segmental and somatic dysfunction of lumbar region: Secondary | ICD-10-CM | POA: Diagnosis not present

## 2018-02-22 DIAGNOSIS — M955 Acquired deformity of pelvis: Secondary | ICD-10-CM | POA: Diagnosis not present

## 2018-02-22 DIAGNOSIS — M5136 Other intervertebral disc degeneration, lumbar region: Secondary | ICD-10-CM | POA: Diagnosis not present

## 2018-02-22 DIAGNOSIS — M9905 Segmental and somatic dysfunction of pelvic region: Secondary | ICD-10-CM | POA: Diagnosis not present

## 2018-02-22 DIAGNOSIS — M9903 Segmental and somatic dysfunction of lumbar region: Secondary | ICD-10-CM | POA: Diagnosis not present

## 2018-03-01 DIAGNOSIS — M5136 Other intervertebral disc degeneration, lumbar region: Secondary | ICD-10-CM | POA: Diagnosis not present

## 2018-03-01 DIAGNOSIS — M9905 Segmental and somatic dysfunction of pelvic region: Secondary | ICD-10-CM | POA: Diagnosis not present

## 2018-03-01 DIAGNOSIS — M9903 Segmental and somatic dysfunction of lumbar region: Secondary | ICD-10-CM | POA: Diagnosis not present

## 2018-03-01 DIAGNOSIS — M955 Acquired deformity of pelvis: Secondary | ICD-10-CM | POA: Diagnosis not present

## 2018-03-04 DIAGNOSIS — Z86010 Personal history of colonic polyps: Secondary | ICD-10-CM | POA: Diagnosis not present

## 2018-03-16 DIAGNOSIS — M5136 Other intervertebral disc degeneration, lumbar region: Secondary | ICD-10-CM | POA: Diagnosis not present

## 2018-03-16 DIAGNOSIS — M9903 Segmental and somatic dysfunction of lumbar region: Secondary | ICD-10-CM | POA: Diagnosis not present

## 2018-03-16 DIAGNOSIS — M9905 Segmental and somatic dysfunction of pelvic region: Secondary | ICD-10-CM | POA: Diagnosis not present

## 2018-03-16 DIAGNOSIS — M955 Acquired deformity of pelvis: Secondary | ICD-10-CM | POA: Diagnosis not present

## 2018-04-13 DIAGNOSIS — M9903 Segmental and somatic dysfunction of lumbar region: Secondary | ICD-10-CM | POA: Diagnosis not present

## 2018-04-13 DIAGNOSIS — M5136 Other intervertebral disc degeneration, lumbar region: Secondary | ICD-10-CM | POA: Diagnosis not present

## 2018-04-13 DIAGNOSIS — M9905 Segmental and somatic dysfunction of pelvic region: Secondary | ICD-10-CM | POA: Diagnosis not present

## 2018-04-13 DIAGNOSIS — M955 Acquired deformity of pelvis: Secondary | ICD-10-CM | POA: Diagnosis not present

## 2018-04-22 ENCOUNTER — Encounter: Payer: Self-pay | Admitting: *Deleted

## 2018-04-25 ENCOUNTER — Encounter
Admission: RE | Disposition: A | Discharge status: Home / Self Care | Payer: Self-pay | Source: Ambulatory Visit | Attending: Gastroenterology

## 2018-04-25 ENCOUNTER — Ambulatory Visit
Admission: RE | Admit: 2018-04-25 | Discharge: 2018-04-25 | Disposition: A | Discharge status: Home / Self Care | Payer: Medicare Other | Source: Ambulatory Visit | Attending: Gastroenterology | Admitting: Gastroenterology

## 2018-04-25 ENCOUNTER — Encounter: Payer: Self-pay | Admitting: Anesthesiology

## 2018-04-25 ENCOUNTER — Ambulatory Visit: Payer: Medicare Other | Admitting: Anesthesiology

## 2018-04-25 DIAGNOSIS — Z86010 Personal history of colonic polyps: Secondary | ICD-10-CM | POA: Diagnosis not present

## 2018-04-25 DIAGNOSIS — K5730 Diverticulosis of large intestine without perforation or abscess without bleeding: Secondary | ICD-10-CM | POA: Diagnosis not present

## 2018-04-25 DIAGNOSIS — Z87891 Personal history of nicotine dependence: Secondary | ICD-10-CM | POA: Insufficient documentation

## 2018-04-25 DIAGNOSIS — Z79899 Other long term (current) drug therapy: Secondary | ICD-10-CM | POA: Diagnosis not present

## 2018-04-25 DIAGNOSIS — Z09 Encounter for follow-up examination after completed treatment for conditions other than malignant neoplasm: Secondary | ICD-10-CM | POA: Diagnosis not present

## 2018-04-25 DIAGNOSIS — I10 Essential (primary) hypertension: Secondary | ICD-10-CM | POA: Insufficient documentation

## 2018-04-25 DIAGNOSIS — Z791 Long term (current) use of non-steroidal anti-inflammatories (NSAID): Secondary | ICD-10-CM | POA: Diagnosis not present

## 2018-04-25 DIAGNOSIS — K648 Other hemorrhoids: Secondary | ICD-10-CM | POA: Diagnosis not present

## 2018-04-25 DIAGNOSIS — K219 Gastro-esophageal reflux disease without esophagitis: Secondary | ICD-10-CM | POA: Diagnosis not present

## 2018-04-25 DIAGNOSIS — K5790 Diverticulosis of intestine, part unspecified, without perforation or abscess without bleeding: Secondary | ICD-10-CM | POA: Diagnosis not present

## 2018-04-25 DIAGNOSIS — Z7982 Long term (current) use of aspirin: Secondary | ICD-10-CM | POA: Insufficient documentation

## 2018-04-25 DIAGNOSIS — Z1211 Encounter for screening for malignant neoplasm of colon: Secondary | ICD-10-CM | POA: Diagnosis not present

## 2018-04-25 HISTORY — DX: Pneumonia, unspecified organism: J18.9

## 2018-04-25 HISTORY — PX: COLONOSCOPY WITH PROPOFOL: SHX5780

## 2018-04-25 HISTORY — DX: Personal history of colonic polyps: Z86.010

## 2018-04-25 HISTORY — DX: Diverticulosis of large intestine without perforation or abscess without bleeding: K57.30

## 2018-04-25 HISTORY — DX: Other hemorrhoids: K64.8

## 2018-04-25 HISTORY — DX: Personal history of colon polyps, unspecified: Z86.0100

## 2018-04-25 SURGERY — COLONOSCOPY WITH PROPOFOL
Anesthesia: General

## 2018-04-25 MED ORDER — MIDAZOLAM HCL 2 MG/2ML IJ SOLN
INTRAMUSCULAR | Status: AC
  Filled 2018-04-25: qty 2

## 2018-04-25 MED ORDER — LIDOCAINE HCL (PF) 1 % IJ SOLN
INTRAMUSCULAR | Status: AC
  Administered 2018-04-25: 0.3 mL
  Filled 2018-04-25: qty 2

## 2018-04-25 MED ORDER — PROPOFOL 500 MG/50ML IV EMUL
INTRAVENOUS | Status: AC
  Filled 2018-04-25: qty 50

## 2018-04-25 MED ORDER — FENTANYL CITRATE (PF) 100 MCG/2ML IJ SOLN
INTRAMUSCULAR | Status: DC | PRN
  Administered 2018-04-25: 25 ug via INTRAVENOUS
  Administered 2018-04-25: 50 ug via INTRAVENOUS
  Administered 2018-04-25: 25 ug via INTRAVENOUS

## 2018-04-25 MED ORDER — MIDAZOLAM HCL 2 MG/2ML IJ SOLN
INTRAMUSCULAR | Status: DC | PRN
  Administered 2018-04-25: 2 mg via INTRAVENOUS

## 2018-04-25 MED ORDER — FENTANYL CITRATE (PF) 100 MCG/2ML IJ SOLN
INTRAMUSCULAR | Status: AC
  Filled 2018-04-25: qty 2

## 2018-04-25 MED ORDER — PROPOFOL 500 MG/50ML IV EMUL
INTRAVENOUS | Status: DC | PRN
  Administered 2018-04-25: 120 ug/kg/min via INTRAVENOUS

## 2018-04-25 MED ORDER — SODIUM CHLORIDE 0.9 % IV SOLN
INTRAVENOUS | Status: DC
  Administered 2018-04-25: 1000 mL via INTRAVENOUS

## 2018-04-25 MED ORDER — SODIUM CHLORIDE 0.9 % IV SOLN
INTRAVENOUS | Status: DC
  Administered 2018-04-25: 11:00:00 via INTRAVENOUS

## 2018-04-25 NOTE — Anesthesia Postprocedure Evaluation (Signed)
Anesthesia Post Note  Patient: Paul Buckley.  Procedure(s) Performed: COLONOSCOPY WITH PROPOFOL (N/A )  Patient location during evaluation: Endoscopy Anesthesia Type: General Level of consciousness: awake and alert and oriented Pain management: pain level controlled Vital Signs Assessment: post-procedure vital signs reviewed and stable Respiratory status: spontaneous breathing, nonlabored ventilation and respiratory function stable Cardiovascular status: blood pressure returned to baseline and stable Postop Assessment: no signs of nausea or vomiting Anesthetic complications: no     Last Vitals:  Vitals:   04/25/18 1116 04/25/18 1127  BP: 106/68 110/73  Pulse: 72 61  Resp: 16 20  Temp:  36.8 C  SpO2: 97% 96%    Last Pain:  Vitals:   04/25/18 1127  TempSrc: Tympanic  PainSc: 0-No pain                 Jeffre Enriques

## 2018-04-25 NOTE — Anesthesia Procedure Notes (Signed)
Performed by: Cook-Martin, Sina Sumpter °Pre-anesthesia Checklist: Patient identified, Emergency Drugs available, Suction available, Patient being monitored and Timeout performed °Patient Re-evaluated:Patient Re-evaluated prior to induction °Oxygen Delivery Method: Simple face mask °Preoxygenation: Pre-oxygenation with 100% oxygen °Induction Type: IV induction °Placement Confirmation: positive ETCO2 and CO2 detector ° ° ° ° ° ° °

## 2018-04-25 NOTE — Op Note (Signed)
[facility] Gastroenterology Patient Name: Paul Buckley Procedure Date: 04/25/2018 9:27 AM MRN: [id number] Account #: [id number] Date of Birth: Oct 05, 1945 Admit Type: Outpatient Age: 72 Room: [facility] ENDO ROOM 1 Gender: Male Note Status: Finalized Procedure:            Colonoscopy Indications:          Personal history of colonic polyps Providers:            Lollie Sails, MD Referring MD:         Karen Chafe. Masonis, MD (Referring MD) Complications:        No immediate complications. Procedure:            Pre-Anesthesia Assessment:                       - ASA Grade Assessment: II - A patient with mild                        systemic disease.                       After obtaining informed consent, the colonoscope was                        passed under direct vision. Throughout the procedure,                        the patient's blood pressure, pulse, and oxygen                        saturations were monitored continuously. The                        Colonoscope was introduced through the anus and                        advanced to the the cecum, identified by appendiceal                        orifice and ileocecal valve. The colonoscopy was                        performed without difficulty. The patient tolerated the                        procedure well. The quality of the bowel preparation                        was good. Findings:      A few small-mouthed diverticula were found in the sigmoid colon,       descending colon and distal descending colon.      Non-bleeding internal hemorrhoids were found during retroflexion and       during anoscopy. The hemorrhoids were small.      No additional abnormalities were found on retroflexion. Impression:           - Diverticulosis in the sigmoid colon, in the                        descending colon and in the distal descending colon.                       -  Non-bleeding internal hemorrhoids.                       -  No specimens collected. Recommendation:       - Discharge patient to home.                       - Repeat colonoscopy in 5 years for screening purposes. Procedure Code(s):    --- Professional ---                       [phone], Colonoscopy, flexible; diagnostic, including                        collection of specimen(s) by brushing or washing, when                        performed (separate procedure) Diagnosis Code(s):    --- Professional ---                       K64.8, Other hemorrhoids                       Z86.010, Personal history of colonic polyps                       K57.30, Diverticulosis of large intestine without                        perforation or abscess without bleeding CPT copyright 2018 [employer]. All rights reserved. The codes documented in this report are preliminary and upon coder review may  be revised to meet current compliance requirements. Lollie Sails, MD 04/25/2018 11:04:38 AM This report has been signed electronically. Number of Addenda: 0 Note Initiated On: 04/25/2018 9:27 AM Scope Withdrawal Time: 0 hours 6 minutes 30 seconds  Total Procedure Duration: 0 hours 18 minutes 31 seconds       [facility]

## 2018-04-25 NOTE — Transfer of Care (Signed)
Immediate Anesthesia Transfer of Care Note  Patient: Paul Buckley.  Procedure(s) Performed: COLONOSCOPY WITH PROPOFOL (N/A )  Patient Location: PACU  Anesthesia Type:General  Level of Consciousness: awake and sedated  Airway & Oxygen Therapy: Patient Spontanous Breathing and Patient connected to nasal cannula oxygen  Post-op Assessment: Report given to RN and Post -op Vital signs reviewed and stable  Post vital signs: Reviewed and stable  Last Vitals:  Vitals Value Taken Time  BP    Temp    Pulse    Resp    SpO2      Last Pain:  Vitals:   04/25/18 1015  TempSrc: Tympanic  PainSc: 0-No pain         Complications: No apparent anesthesia complications

## 2018-04-25 NOTE — Anesthesia Post-op Follow-up Note (Signed)
Anesthesia QCDR form completed.        

## 2018-04-25 NOTE — H&P (Signed)
Outpatient short stay form Pre-procedure 04/25/2018 10:16 AM Paul Sails MD  Primary Physician: Dr. Rinaldo Cloud  Reason for visit: Colonoscopy  History of present illness: Patient is a 72 year old male presenting today as above.  He has personal history of adenomatous colon polyps with his last procedure being done 07/2014.  At that time there is a 12 mm adenoma removed.  Denies any problems with changes in bowel habits diarrhea abdominal pain or rectal bleeding.   No current facility-administered medications for this encounter.   Facility-Administered Medications Prior to Admission  Medication Dose Route Frequency Provider Last Rate Last Dose  . betamethasone acetate-betamethasone sodium phosphate (CELESTONE) injection 3 mg  3 mg Intramuscular Once Daylene Katayama M, DPM      . betamethasone acetate-betamethasone sodium phosphate (CELESTONE) injection 3 mg  3 mg Intramuscular Once Daylene Katayama M, DPM      . betamethasone acetate-betamethasone sodium phosphate (CELESTONE) injection 3 mg  3 mg Intramuscular Once Edrick Kins, DPM       Medications Prior to Admission  Medication Sig Dispense Refill Last Dose  . lisinopril (PRINIVIL,ZESTRIL) 5 MG tablet Take 5 mg by mouth daily.     [name] aspirin 81 MG tablet Take 81 mg by mouth once a week.    04/18/2018  . atorvastatin (LIPITOR) 20 MG tablet Take 20 mg by mouth daily.  12 Taking  . losartan-hydrochlorothiazide (HYZAAR) 100-12.5 MG tablet Take 1 tablet by mouth daily.  3 Taking  . meloxicam (MOBIC) 15 MG tablet Take 1 tablet (15 mg total) by mouth daily. (Patient taking differently: Take 15 mg by mouth as needed. ) 60 tablet 3 Taking  . ranitidine (ZANTAC) 150 MG capsule Take 150 mg by mouth as needed.    Not Taking     No Known Allergies   Past Medical History:  Diagnosis Date  . Colon, diverticulosis   . GERD (gastroesophageal reflux disease)   . History of colonic polyps   . Internal hemorrhoids   . Pneumonia     Review of  systems:      Physical Exam    Heart and lungs: Good rate and rhythm without rub or gallop, lungs are bilaterally clear.    HEENT: Normocephalic atraumatic eyes are anicteric    Other:    Pertinant exam for procedure: Soft nontender nondistended, bowel sounds are positive normoactive    Planned proceedures: Colonoscopy and indicated procedures. I have discussed the risks benefits and complications of procedures to include not limited to bleeding, infection, perforation and the risk of sedation and the patient wishes to proceed.    Paul Sails, MD Gastroenterology 04/25/2018  10:16 AM

## 2018-04-25 NOTE — Anesthesia Preprocedure Evaluation (Signed)
Anesthesia Evaluation  Patient identified by MRN, date of birth, ID band Patient awake    Reviewed: Allergy & Precautions, NPO status , Patient's Chart, lab work & pertinent test results  History of Anesthesia Complications Negative for: history of anesthetic complications  Airway Mallampati: II  TM Distance: >3 FB Neck ROM: Full    Dental no notable dental hx.    Pulmonary neg sleep apnea, neg COPD, former smoker,    breath sounds clear to auscultation- rhonchi (-) wheezing      Cardiovascular Exercise Tolerance: Good hypertension, (-) CAD, (-) Past MI, (-) Cardiac Stents and (-) CABG  Rhythm:Regular Rate:Normal - Systolic murmurs and - Diastolic murmurs    Neuro/Psych negative neurological ROS  negative psych ROS   GI/Hepatic Neg liver ROS, GERD  ,  Endo/Other  negative endocrine ROSneg diabetes  Renal/GU negative Renal ROS     Musculoskeletal negative musculoskeletal ROS (+)   Abdominal (+) + obese,   Peds  Hematology negative hematology ROS (+)   Anesthesia Other Findings Past Medical History: No date: Colon, diverticulosis No date: GERD (gastroesophageal reflux disease) No date: History of colonic polyps No date: Internal hemorrhoids No date: Pneumonia   Reproductive/Obstetrics                             Anesthesia Physical Anesthesia Plan  ASA: II  Anesthesia Plan: General   Post-op Pain Management:    Induction: Intravenous  PONV Risk Score and Plan: 1 and Propofol infusion  Airway Management Planned: Natural Airway  Additional Equipment:   Intra-op Plan:   Post-operative Plan:   Informed Consent: I have reviewed the patients History and Physical, chart, labs and discussed the procedure including the risks, benefits and alternatives for the proposed anesthesia with the patient or authorized representative who has indicated his/her understanding and acceptance.    Dental advisory given  Plan Discussed with: CRNA and Anesthesiologist  Anesthesia Plan Comments:         Anesthesia Quick Evaluation

## 2018-04-26 DIAGNOSIS — Z23 Encounter for immunization: Secondary | ICD-10-CM | POA: Diagnosis not present

## 2018-05-02 DIAGNOSIS — S8391XD Sprain of unspecified site of right knee, subsequent encounter: Secondary | ICD-10-CM | POA: Diagnosis not present

## 2018-05-05 DIAGNOSIS — E7849 Other hyperlipidemia: Secondary | ICD-10-CM | POA: Diagnosis not present

## 2018-05-05 DIAGNOSIS — I10 Essential (primary) hypertension: Secondary | ICD-10-CM | POA: Diagnosis not present

## 2018-05-05 DIAGNOSIS — R5381 Other malaise: Secondary | ICD-10-CM | POA: Diagnosis not present

## 2018-05-10 DIAGNOSIS — M25561 Pain in right knee: Secondary | ICD-10-CM | POA: Diagnosis not present

## 2018-05-10 DIAGNOSIS — M5136 Other intervertebral disc degeneration, lumbar region: Secondary | ICD-10-CM | POA: Diagnosis not present

## 2018-05-10 DIAGNOSIS — M9903 Segmental and somatic dysfunction of lumbar region: Secondary | ICD-10-CM | POA: Diagnosis not present

## 2018-05-10 DIAGNOSIS — R600 Localized edema: Secondary | ICD-10-CM | POA: Diagnosis not present

## 2018-05-10 DIAGNOSIS — M9905 Segmental and somatic dysfunction of pelvic region: Secondary | ICD-10-CM | POA: Diagnosis not present

## 2018-05-10 DIAGNOSIS — M1711 Unilateral primary osteoarthritis, right knee: Secondary | ICD-10-CM | POA: Diagnosis not present

## 2018-05-10 DIAGNOSIS — M955 Acquired deformity of pelvis: Secondary | ICD-10-CM | POA: Diagnosis not present

## 2018-05-11 DIAGNOSIS — Z0000 Encounter for general adult medical examination without abnormal findings: Secondary | ICD-10-CM | POA: Diagnosis not present

## 2018-05-11 DIAGNOSIS — J449 Chronic obstructive pulmonary disease, unspecified: Secondary | ICD-10-CM | POA: Diagnosis not present

## 2018-05-11 DIAGNOSIS — H33009 Unspecified retinal detachment with retinal break, unspecified eye: Secondary | ICD-10-CM | POA: Diagnosis not present

## 2018-05-11 DIAGNOSIS — I8391 Asymptomatic varicose veins of right lower extremity: Secondary | ICD-10-CM | POA: Diagnosis not present

## 2018-05-11 DIAGNOSIS — E669 Obesity, unspecified: Secondary | ICD-10-CM | POA: Diagnosis not present

## 2018-05-12 DIAGNOSIS — R600 Localized edema: Secondary | ICD-10-CM | POA: Diagnosis not present

## 2018-05-12 DIAGNOSIS — M25561 Pain in right knee: Secondary | ICD-10-CM | POA: Diagnosis not present

## 2018-05-12 DIAGNOSIS — M1711 Unilateral primary osteoarthritis, right knee: Secondary | ICD-10-CM | POA: Diagnosis not present

## 2018-05-16 ENCOUNTER — Ambulatory Visit (INDEPENDENT_AMBULATORY_CARE_PROVIDER_SITE_OTHER): Payer: Medicare Other | Admitting: Vascular Surgery

## 2018-05-16 ENCOUNTER — Ambulatory Visit (INDEPENDENT_AMBULATORY_CARE_PROVIDER_SITE_OTHER): Payer: Medicare Other

## 2018-05-16 ENCOUNTER — Encounter (INDEPENDENT_AMBULATORY_CARE_PROVIDER_SITE_OTHER): Payer: Self-pay | Admitting: Vascular Surgery

## 2018-05-16 VITALS — BP 123/74 | HR 81 | Resp 16 | Ht 72.0 in | Wt 236.0 lb

## 2018-05-16 DIAGNOSIS — M79605 Pain in left leg: Secondary | ICD-10-CM

## 2018-05-16 DIAGNOSIS — E785 Hyperlipidemia, unspecified: Secondary | ICD-10-CM | POA: Diagnosis not present

## 2018-05-16 DIAGNOSIS — Z87891 Personal history of nicotine dependence: Secondary | ICD-10-CM | POA: Diagnosis not present

## 2018-05-16 DIAGNOSIS — I83893 Varicose veins of bilateral lower extremities with other complications: Secondary | ICD-10-CM

## 2018-05-16 DIAGNOSIS — I872 Venous insufficiency (chronic) (peripheral): Secondary | ICD-10-CM | POA: Diagnosis not present

## 2018-05-16 DIAGNOSIS — I10 Essential (primary) hypertension: Secondary | ICD-10-CM

## 2018-05-16 DIAGNOSIS — M79604 Pain in right leg: Secondary | ICD-10-CM

## 2018-05-17 ENCOUNTER — Encounter (INDEPENDENT_AMBULATORY_CARE_PROVIDER_SITE_OTHER): Payer: Self-pay | Admitting: Vascular Surgery

## 2018-05-17 DIAGNOSIS — M79606 Pain in leg, unspecified: Secondary | ICD-10-CM | POA: Insufficient documentation

## 2018-05-17 DIAGNOSIS — I872 Venous insufficiency (chronic) (peripheral): Secondary | ICD-10-CM | POA: Insufficient documentation

## 2018-05-17 DIAGNOSIS — M79604 Pain in right leg: Secondary | ICD-10-CM | POA: Insufficient documentation

## 2018-05-17 NOTE — Progress Notes (Signed)
MRN : [id number]  Paul Michon. is a 72 y.o. (1945/10/25) male who presents with chief complaint of  Chief Complaint  Patient presents with  . Follow-up    60month ultrasound follow up  .  History of Present Illness:   The patient is seen for evaluation of painful lower extremities. Patient notes the pain is variable and not always associated with activity.  The pain is somewhat consistent day to day occurring on most days. The patient notes the pain also occurs with standing and routinely seems worse as the day wears on. The pain has been progressive over the past several years. The patient states these symptoms are causing  a profound negative impact on quality of life and daily activities.  The patient denies rest pain or dangling of an extremity off the side of the bed during the night for relief. No open wounds or sores at this time. No history of DVT or phlebitis. No prior interventions or surgeries.  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    Current Meds  Medication Sig  . aspirin 81 MG tablet Take 81 mg by mouth once a week.   [name] atorvastatin (LIPITOR) 20 MG tablet Take 20 mg by mouth daily.  [name] losartan-hydrochlorothiazide (HYZAAR) 100-12.5 MG tablet Take 1 tablet by mouth daily.   Current Facility-Administered Medications for the 05/16/18 encounter (Office Visit) with Delana Meyer, Dolores Lory, MD  Medication  . betamethasone acetate-betamethasone sodium phosphate (CELESTONE) injection 3 mg  . betamethasone acetate-betamethasone sodium phosphate (CELESTONE) injection 3 mg  . betamethasone acetate-betamethasone sodium phosphate (CELESTONE) injection 3 mg    Past Medical History:  Diagnosis Date  . Colon, diverticulosis   . GERD (gastroesophageal reflux disease)   . History of colonic polyps   . Internal hemorrhoids   . Pneumonia     Past Surgical History:  Procedure Laterality Date  . APPENDECTOMY  1963  . COLONOSCOPY WITH  PROPOFOL N/A 04/25/2018   Procedure: COLONOSCOPY WITH PROPOFOL;  Surgeon: Lollie Sails, MD;  Location: [facility] ENDOSCOPY;  Service: Endoscopy;  Laterality: N/A;  . EYE SURGERY  2013  . HERNIA REPAIR  1986    Social History Social History   Tobacco Use  . Smoking status: Former Smoker    Years: 50.00    Last attempt to quit: 07/14/1967    Years since quitting: 50.8  . Smokeless tobacco: Never Used  Substance Use Topics  . Alcohol use: Not Currently  . Drug use: No    Family History Family History  Problem Relation Age of Onset  . Cancer Mother   . Cancer Father   . Cancer Maternal Uncle   . Cancer Paternal Uncle   . Heart attack Paternal Grandfather     No Known Allergies   REVIEW OF SYSTEMS (Negative unless checked)  Constitutional: [] Weight loss  [] Fever  [] Chills Cardiac: [] Chest pain   [] Chest pressure   [] Palpitations   [] Shortness of breath when laying flat   [] Shortness of breath with exertion. Vascular:  [] Pain in legs with walking   [x] Pain in legs with standing  [] History of DVT   [] Phlebitis   [x] Swelling in legs   [] Varicose veins   [] Non-healing ulcers Pulmonary:   [] Uses home oxygen   [] Productive cough   [] Hemoptysis   [] Wheeze  [] COPD   [] Asthma Neurologic:  [] Dizziness   [] Seizures   [] History of stroke   [] History of TIA  [] Aphasia   []   Vissual changes   [] Weakness or numbness in arm   [] Weakness or numbness in leg Musculoskeletal:   [] Joint swelling   [x] Joint pain   [x] Low back pain Hematologic:  [] Easy bruising  [] Easy bleeding   [] Hypercoagulable state   [] Anemic Gastrointestinal:  [] Diarrhea   [] Vomiting  [] Gastroesophageal reflux/heartburn   [] Difficulty swallowing. Genitourinary:  [] Chronic kidney disease   [] Difficult urination  [] Frequent urination   [] Blood in urine Skin:  [x] Rashes   [] Ulcers  Psychological:  [] History of anxiety   []  History of major depression.  Physical Examination  Vitals:   05/16/18 1436  BP: 123/74  Pulse: 81    Resp: 16  Weight: 236 lb (107 kg)  Height: 6' (1.829 m)   Body mass index is 32.01 kg/m. Gen: WD/WN, NAD Head: Reese/AT, No temporalis wasting.  Ear/Nose/Throat: Hearing grossly intact, nares w/o erythema or drainage Eyes: PER, EOMI, sclera nonicteric.  Neck: Supple, no large masses.   Pulmonary:  Good air movement, no audible wheezing bilaterally, no use of accessory muscles.  Cardiac: RRR, no JVD Vascular:scattered varicosities present bilaterally.  Mild venous stasis changes to the legs bilaterally.  2+ soft pitting edema Vessel Right Left  Radial Palpable Palpable  PT Trace Palpable Trace Palpable  DP Trace Palpable Trace Palpable  Gastrointestinal: Non-distended. No guarding/no peritoneal signs.  Musculoskeletal: M/S 5/5 throughout.  No deformity or atrophy.  Neurologic: CN 2-12 intact. Symmetrical.  Speech is fluent. Motor exam as listed above. Psychiatric: Judgment intact, Mood & affect appropriate for pt's clinical situation. Dermatologic: venous rashes no ulcers noted.  No changes consistent with cellulitis. Lymph : No lichenification or skin changes of chronic lymphedema.  CBC Lab Results  Component Value Date   WBC 5.5 05/26/2016   HGB 14.3 05/26/2016   HCT 41.8 05/26/2016   MCV 94.3 05/26/2016   PLT 243 05/26/2016    BMET    Component Value Date/Time   NA 140 06/02/2016 1039   NA 135 (L) 07/04/2014 1415   K 4.5 06/02/2016 1039   K 3.7 07/04/2014 1415   CL 107 06/02/2016 1039   CL 102 07/04/2014 1415   CO2 29 06/02/2016 1039   CO2 26 07/04/2014 1415   GLUCOSE 97 05/26/2016 1038   GLUCOSE 169 (H) 07/04/2014 1415   BUN 20 05/26/2016 1038   BUN 20 (H) 07/04/2014 1415   CREATININE 0.97 05/26/2016 1038   CREATININE 1.50 (H) 07/04/2014 1415   CALCIUM 8.9 06/02/2016 1039   CALCIUM 8.6 07/04/2014 1415   GFRNONAA >60 05/26/2016 1038   GFRNONAA 49 (L) 07/04/2014 1415   GFRAA >60 05/26/2016 1038   GFRAA 60 (L) 07/04/2014 1415   CrCl cannot be calculated  (Patient's most recent lab result is older than the maximum 21 days allowed.).  COAG No results found for: INR, PROTIME  Radiology Vas [place] Abi With/wo Tbi  Result Date: 05/16/2018 LOWER EXTREMITY DOPPLER STUDY Indications: Edema.  Comparison Study: none Performing Technologist: Concha Norway RVT  Examination Guidelines: A complete evaluation includes at minimum, Doppler waveform signals and systolic blood pressure reading at the level of bilateral brachial, anterior tibial, and posterior tibial arteries, when vessel segments are accessible. Bilateral testing is considered an integral part of a complete examination. Photoelectric Plethysmograph (PPG) waveforms and toe systolic pressure readings are included as required and additional duplex testing as needed. Limited examinations for reoccurring indications may be performed as noted.  ABI Findings: +--------+------------------+-----+---------+--------+ Right   Rt Pressure (mmHg)IndexWaveform Comment  +--------+------------------+-----+---------+--------+ [id number]                                      +--------+------------------+-----+---------+--------+  ATA     181               1.45 triphasic         +--------+------------------+-----+---------+--------+ PTA     175               1.40 triphasic         +--------+------------------+-----+---------+--------+ +--------+------------------+-----+---------+-------+ Left    Lt Pressure (mmHg)IndexWaveform Comment +--------+------------------+-----+---------+-------+ [id number]                                     +--------+------------------+-----+---------+-------+ ATA     164               1.31 triphasic        +--------+------------------+-----+---------+-------+ PTA     177               1.42 triphasic        +--------+------------------+-----+---------+-------+ +-------+-----------+-----------+------------+------------+ ABI/TBIToday's ABIToday's TBIPrevious  ABIPrevious TBI +-------+-----------+-----------+------------+------------+ Right  1.42                                           +-------+-----------+-----------+------------+------------+ Left   1.45                                           +-------+-----------+-----------+------------+------------+  Summary: Right: Resting right ankle-brachial index is within normal range. No evidence of significant right lower extremity arterial disease. Normal Triphasic flow. ABI higher due to edema at ankle. Left: Resting left ankle-brachial index is within normal range. No evidence of significant left lower extremity arterial disease. Normal Triphasic flow. ABI higher due to edema at ankle.  *See table(s) above for measurements and observations.  Electronically signed by Hortencia Pilar MD on 05/16/2018 at 4:55:33 PM.   Final    Vas [place] Lower Extremity Venous Reflux  Result Date: 05/16/2018  Lower Venous Reflux Study Indications: Edema.  Comparison Study: 07/04/2013 Performing Technologist: Concha Norway RVT  Examination Guidelines: A complete evaluation includes B-mode imaging, spectral Doppler, color Doppler, and power Doppler as needed of all accessible portions of each vessel. Bilateral testing is considered an integral part of a complete examination. Limited examinations for reoccurring indications may be performed as noted. The reflux portion of the exam is performed with the patient in reverse Trendelenburg.  Right Venous Findings: +---------+---------------+---------+-----------+----------+-------+          CompressibilityPhasicitySpontaneityPropertiesSummary +---------+---------------+---------+-----------+----------+-------+ CFV      Full                                                 +---------+---------------+---------+-----------+----------+-------+ SFJ      Full                                                  +---------+---------------+---------+-----------+----------+-------+ FV Prox  Full                                                 +---------+---------------+---------+-----------+----------+-------+  FV Mid   Full                                                 +---------+---------------+---------+-----------+----------+-------+ FV DistalFull                                                 +---------+---------------+---------+-----------+----------+-------+ POP      Full                                                 +---------+---------------+---------+-----------+----------+-------+ GSV      Full                                                 +---------+---------------+---------+-----------+----------+-------+ SSV      Full                                                 +---------+---------------+---------+-----------+----------+-------+                         Yes      Yes                          +---------+---------------+---------+-----------+----------+-------+  Left Venous Findings: +---------+---------------+---------+-----------+----------+-------+          CompressibilityPhasicitySpontaneityPropertiesSummary +---------+---------------+---------+-----------+----------+-------+ CFV      Full                                                 +---------+---------------+---------+-----------+----------+-------+ SFJ      Full                                                 +---------+---------------+---------+-----------+----------+-------+ FV Prox  Full                                                 +---------+---------------+---------+-----------+----------+-------+ FV Mid   Full                                                 +---------+---------------+---------+-----------+----------+-------+ FV DistalFull                                                  +---------+---------------+---------+-----------+----------+-------+  POP      Full                                                 +---------+---------------+---------+-----------+----------+-------+ GSV      Full                                                 +---------+---------------+---------+-----------+----------+-------+ SSV      Full                                                 +---------+---------------+---------+-----------+----------+-------+                         Yes      Yes                          +---------+---------------+---------+-----------+----------+-------+  Venous Reflux Times Normal value < 0.5 sec +--------------+----------+---------+               Right (ms)Left (ms) +--------------+----------+---------+ CFV                     1137.00   +--------------+----------+---------+ GSV dist [id number]             +--------------+----------+---------+ GSV prox calf 2376.00             +--------------+----------+---------+ Vein Diameters: +-------------------+----------+---------+                    Right (cm)Left (cm) +-------------------+----------+---------+ GSV at distal thigh.54                 +-------------------+----------+---------+ GSV prox calf      .63                 +-------------------+----------+---------+   Summary: Right: Abnormal reflux times were noted in the great saphenous vein at the distal thigh, and great saphenous vein at the prox calf. Evidence of chronic venous insufficiency is detected in the great saphenous vein. There is no evidence of deep vein thrombosis in the lower extremity.There is no evidence of superficial venous thrombosis. Large mid thigh perforator seen off GSV. Large varicosity seen in the distal thigh area of the GSV with significant reflux seen. Left: Abnormal reflux times were noted in the common femoral vein. Evidence of chronic venous insufficiency is detected in the deep venous system.  There is no evidence of deep vein thrombosis in the lower extremity.There is no evidence of superficial venous thrombosis.  *See table(s) above for measurements and observations. Electronically signed by Hortencia Pilar MD on 05/16/2018 at 4:55:31 PM.    Final      Assessment/Plan 1. Chronic venous insufficiency Recommend:  The patient is complaining of varicose veins.    I have had a long discussion with the patient regarding  varicose veins and why they cause symptoms.  Patient will begin wearing graduated compression stockings on a daily basis, beginning first thing in the morning and removing them in the evening. The patient is instructed specifically not to  sleep in the stockings.    In addition, behavioral modification including elevation during the day will be initiated, utilizing a recliner was recommended.  The patient is also instructed to continue exercising such as walking 4-5 times per week.  At this time the patient wishes to continue conservative therapy and is not interested in more invasive treatments such as laser ablation and sclerotherapy.  The Patient will follow up PRN if the symptoms worsen.  2. Pain in both lower extremities See #1  3. Essential hypertension Continue antihypertensive medications as already ordered, these medications have been reviewed and there are no changes at this time.   4. Hyperlipidemia, unspecified hyperlipidemia type Continue statin as ordered and reviewed, no changes at this time     Hortencia Pilar, MD  05/17/2018 8:52 PM

## 2018-05-18 DIAGNOSIS — H43812 Vitreous degeneration, left eye: Secondary | ICD-10-CM | POA: Diagnosis not present

## 2018-05-19 DIAGNOSIS — M25561 Pain in right knee: Secondary | ICD-10-CM | POA: Diagnosis not present

## 2018-05-19 DIAGNOSIS — M1711 Unilateral primary osteoarthritis, right knee: Secondary | ICD-10-CM | POA: Diagnosis not present

## 2018-05-24 DIAGNOSIS — M1711 Unilateral primary osteoarthritis, right knee: Secondary | ICD-10-CM | POA: Diagnosis not present

## 2018-05-24 DIAGNOSIS — M25561 Pain in right knee: Secondary | ICD-10-CM | POA: Diagnosis not present

## 2018-05-31 ENCOUNTER — Other Ambulatory Visit: Payer: Self-pay

## 2018-06-07 DIAGNOSIS — M9903 Segmental and somatic dysfunction of lumbar region: Secondary | ICD-10-CM | POA: Diagnosis not present

## 2018-06-07 DIAGNOSIS — M5136 Other intervertebral disc degeneration, lumbar region: Secondary | ICD-10-CM | POA: Diagnosis not present

## 2018-06-07 DIAGNOSIS — M9905 Segmental and somatic dysfunction of pelvic region: Secondary | ICD-10-CM | POA: Diagnosis not present

## 2018-06-07 DIAGNOSIS — M955 Acquired deformity of pelvis: Secondary | ICD-10-CM | POA: Diagnosis not present

## 2018-07-04 DIAGNOSIS — M955 Acquired deformity of pelvis: Secondary | ICD-10-CM | POA: Diagnosis not present

## 2018-07-04 DIAGNOSIS — M9905 Segmental and somatic dysfunction of pelvic region: Secondary | ICD-10-CM | POA: Diagnosis not present

## 2018-07-04 DIAGNOSIS — M5136 Other intervertebral disc degeneration, lumbar region: Secondary | ICD-10-CM | POA: Diagnosis not present

## 2018-07-04 DIAGNOSIS — M9903 Segmental and somatic dysfunction of lumbar region: Secondary | ICD-10-CM | POA: Diagnosis not present

## 2018-08-01 DIAGNOSIS — M955 Acquired deformity of pelvis: Secondary | ICD-10-CM | POA: Diagnosis not present

## 2018-08-01 DIAGNOSIS — M5136 Other intervertebral disc degeneration, lumbar region: Secondary | ICD-10-CM | POA: Diagnosis not present

## 2018-08-01 DIAGNOSIS — M9903 Segmental and somatic dysfunction of lumbar region: Secondary | ICD-10-CM | POA: Diagnosis not present

## 2018-08-01 DIAGNOSIS — M9905 Segmental and somatic dysfunction of pelvic region: Secondary | ICD-10-CM | POA: Diagnosis not present

## 2018-08-29 DIAGNOSIS — M5136 Other intervertebral disc degeneration, lumbar region: Secondary | ICD-10-CM | POA: Diagnosis not present

## 2018-08-29 DIAGNOSIS — M9903 Segmental and somatic dysfunction of lumbar region: Secondary | ICD-10-CM | POA: Diagnosis not present

## 2018-08-29 DIAGNOSIS — M9905 Segmental and somatic dysfunction of pelvic region: Secondary | ICD-10-CM | POA: Diagnosis not present

## 2018-08-29 DIAGNOSIS — M955 Acquired deformity of pelvis: Secondary | ICD-10-CM | POA: Diagnosis not present

## 2018-09-12 DIAGNOSIS — M9905 Segmental and somatic dysfunction of pelvic region: Secondary | ICD-10-CM | POA: Diagnosis not present

## 2018-09-12 DIAGNOSIS — M955 Acquired deformity of pelvis: Secondary | ICD-10-CM | POA: Diagnosis not present

## 2018-09-12 DIAGNOSIS — M5136 Other intervertebral disc degeneration, lumbar region: Secondary | ICD-10-CM | POA: Diagnosis not present

## 2018-09-12 DIAGNOSIS — M9903 Segmental and somatic dysfunction of lumbar region: Secondary | ICD-10-CM | POA: Diagnosis not present

## 2018-09-26 DIAGNOSIS — M9905 Segmental and somatic dysfunction of pelvic region: Secondary | ICD-10-CM | POA: Diagnosis not present

## 2018-09-26 DIAGNOSIS — M5136 Other intervertebral disc degeneration, lumbar region: Secondary | ICD-10-CM | POA: Diagnosis not present

## 2018-09-26 DIAGNOSIS — M955 Acquired deformity of pelvis: Secondary | ICD-10-CM | POA: Diagnosis not present

## 2018-09-26 DIAGNOSIS — M9903 Segmental and somatic dysfunction of lumbar region: Secondary | ICD-10-CM | POA: Diagnosis not present

## 2018-09-27 DIAGNOSIS — M955 Acquired deformity of pelvis: Secondary | ICD-10-CM | POA: Diagnosis not present

## 2018-09-27 DIAGNOSIS — M9905 Segmental and somatic dysfunction of pelvic region: Secondary | ICD-10-CM | POA: Diagnosis not present

## 2018-09-27 DIAGNOSIS — M5136 Other intervertebral disc degeneration, lumbar region: Secondary | ICD-10-CM | POA: Diagnosis not present

## 2018-09-27 DIAGNOSIS — M9903 Segmental and somatic dysfunction of lumbar region: Secondary | ICD-10-CM | POA: Diagnosis not present

## 2018-10-07 DIAGNOSIS — M9903 Segmental and somatic dysfunction of lumbar region: Secondary | ICD-10-CM | POA: Diagnosis not present

## 2018-10-07 DIAGNOSIS — M955 Acquired deformity of pelvis: Secondary | ICD-10-CM | POA: Diagnosis not present

## 2018-10-07 DIAGNOSIS — M5136 Other intervertebral disc degeneration, lumbar region: Secondary | ICD-10-CM | POA: Diagnosis not present

## 2018-10-07 DIAGNOSIS — M9905 Segmental and somatic dysfunction of pelvic region: Secondary | ICD-10-CM | POA: Diagnosis not present

## 2018-11-01 DIAGNOSIS — M9903 Segmental and somatic dysfunction of lumbar region: Secondary | ICD-10-CM | POA: Diagnosis not present

## 2018-11-01 DIAGNOSIS — M955 Acquired deformity of pelvis: Secondary | ICD-10-CM | POA: Diagnosis not present

## 2018-11-01 DIAGNOSIS — M5136 Other intervertebral disc degeneration, lumbar region: Secondary | ICD-10-CM | POA: Diagnosis not present

## 2018-11-01 DIAGNOSIS — M9905 Segmental and somatic dysfunction of pelvic region: Secondary | ICD-10-CM | POA: Diagnosis not present

## 2018-11-08 DIAGNOSIS — M25461 Effusion, right knee: Secondary | ICD-10-CM | POA: Diagnosis not present

## 2018-11-08 DIAGNOSIS — I8391 Asymptomatic varicose veins of right lower extremity: Secondary | ICD-10-CM | POA: Diagnosis not present

## 2018-11-08 DIAGNOSIS — H33009 Unspecified retinal detachment with retinal break, unspecified eye: Secondary | ICD-10-CM | POA: Diagnosis not present

## 2018-11-08 DIAGNOSIS — J449 Chronic obstructive pulmonary disease, unspecified: Secondary | ICD-10-CM | POA: Diagnosis not present

## 2018-11-22 DIAGNOSIS — M9903 Segmental and somatic dysfunction of lumbar region: Secondary | ICD-10-CM | POA: Diagnosis not present

## 2018-11-22 DIAGNOSIS — M955 Acquired deformity of pelvis: Secondary | ICD-10-CM | POA: Diagnosis not present

## 2018-11-22 DIAGNOSIS — M9905 Segmental and somatic dysfunction of pelvic region: Secondary | ICD-10-CM | POA: Diagnosis not present

## 2018-11-22 DIAGNOSIS — M5136 Other intervertebral disc degeneration, lumbar region: Secondary | ICD-10-CM | POA: Diagnosis not present

## 2018-12-12 DIAGNOSIS — M5136 Other intervertebral disc degeneration, lumbar region: Secondary | ICD-10-CM | POA: Diagnosis not present

## 2018-12-12 DIAGNOSIS — M955 Acquired deformity of pelvis: Secondary | ICD-10-CM | POA: Diagnosis not present

## 2018-12-12 DIAGNOSIS — M9903 Segmental and somatic dysfunction of lumbar region: Secondary | ICD-10-CM | POA: Diagnosis not present

## 2018-12-12 DIAGNOSIS — M9905 Segmental and somatic dysfunction of pelvic region: Secondary | ICD-10-CM | POA: Diagnosis not present

## 2018-12-20 DIAGNOSIS — M9905 Segmental and somatic dysfunction of pelvic region: Secondary | ICD-10-CM | POA: Diagnosis not present

## 2018-12-20 DIAGNOSIS — M9903 Segmental and somatic dysfunction of lumbar region: Secondary | ICD-10-CM | POA: Diagnosis not present

## 2018-12-20 DIAGNOSIS — M25561 Pain in right knee: Secondary | ICD-10-CM | POA: Diagnosis not present

## 2018-12-20 DIAGNOSIS — M955 Acquired deformity of pelvis: Secondary | ICD-10-CM | POA: Diagnosis not present

## 2018-12-20 DIAGNOSIS — M5136 Other intervertebral disc degeneration, lumbar region: Secondary | ICD-10-CM | POA: Diagnosis not present

## 2018-12-23 DIAGNOSIS — M955 Acquired deformity of pelvis: Secondary | ICD-10-CM | POA: Diagnosis not present

## 2018-12-23 DIAGNOSIS — M5136 Other intervertebral disc degeneration, lumbar region: Secondary | ICD-10-CM | POA: Diagnosis not present

## 2018-12-23 DIAGNOSIS — M9903 Segmental and somatic dysfunction of lumbar region: Secondary | ICD-10-CM | POA: Diagnosis not present

## 2018-12-23 DIAGNOSIS — M9905 Segmental and somatic dysfunction of pelvic region: Secondary | ICD-10-CM | POA: Diagnosis not present

## 2018-12-26 DIAGNOSIS — M9905 Segmental and somatic dysfunction of pelvic region: Secondary | ICD-10-CM | POA: Diagnosis not present

## 2018-12-26 DIAGNOSIS — M5136 Other intervertebral disc degeneration, lumbar region: Secondary | ICD-10-CM | POA: Diagnosis not present

## 2018-12-26 DIAGNOSIS — M9903 Segmental and somatic dysfunction of lumbar region: Secondary | ICD-10-CM | POA: Diagnosis not present

## 2018-12-26 DIAGNOSIS — M955 Acquired deformity of pelvis: Secondary | ICD-10-CM | POA: Diagnosis not present

## 2018-12-28 DIAGNOSIS — M5136 Other intervertebral disc degeneration, lumbar region: Secondary | ICD-10-CM | POA: Diagnosis not present

## 2018-12-28 DIAGNOSIS — M955 Acquired deformity of pelvis: Secondary | ICD-10-CM | POA: Diagnosis not present

## 2018-12-28 DIAGNOSIS — M9903 Segmental and somatic dysfunction of lumbar region: Secondary | ICD-10-CM | POA: Diagnosis not present

## 2018-12-28 DIAGNOSIS — M9905 Segmental and somatic dysfunction of pelvic region: Secondary | ICD-10-CM | POA: Diagnosis not present

## 2019-01-06 DIAGNOSIS — M955 Acquired deformity of pelvis: Secondary | ICD-10-CM | POA: Diagnosis not present

## 2019-01-06 DIAGNOSIS — M5136 Other intervertebral disc degeneration, lumbar region: Secondary | ICD-10-CM | POA: Diagnosis not present

## 2019-01-06 DIAGNOSIS — M9905 Segmental and somatic dysfunction of pelvic region: Secondary | ICD-10-CM | POA: Diagnosis not present

## 2019-01-06 DIAGNOSIS — M9903 Segmental and somatic dysfunction of lumbar region: Secondary | ICD-10-CM | POA: Diagnosis not present

## 2019-01-09 DIAGNOSIS — M955 Acquired deformity of pelvis: Secondary | ICD-10-CM | POA: Diagnosis not present

## 2019-01-09 DIAGNOSIS — M5136 Other intervertebral disc degeneration, lumbar region: Secondary | ICD-10-CM | POA: Diagnosis not present

## 2019-01-09 DIAGNOSIS — M9903 Segmental and somatic dysfunction of lumbar region: Secondary | ICD-10-CM | POA: Diagnosis not present

## 2019-01-09 DIAGNOSIS — M9905 Segmental and somatic dysfunction of pelvic region: Secondary | ICD-10-CM | POA: Diagnosis not present

## 2019-01-11 DIAGNOSIS — M955 Acquired deformity of pelvis: Secondary | ICD-10-CM | POA: Diagnosis not present

## 2019-01-11 DIAGNOSIS — M9903 Segmental and somatic dysfunction of lumbar region: Secondary | ICD-10-CM | POA: Diagnosis not present

## 2019-01-11 DIAGNOSIS — M5136 Other intervertebral disc degeneration, lumbar region: Secondary | ICD-10-CM | POA: Diagnosis not present

## 2019-01-11 DIAGNOSIS — M9905 Segmental and somatic dysfunction of pelvic region: Secondary | ICD-10-CM | POA: Diagnosis not present

## 2019-01-17 DIAGNOSIS — M955 Acquired deformity of pelvis: Secondary | ICD-10-CM | POA: Diagnosis not present

## 2019-01-17 DIAGNOSIS — M5136 Other intervertebral disc degeneration, lumbar region: Secondary | ICD-10-CM | POA: Diagnosis not present

## 2019-01-17 DIAGNOSIS — M9905 Segmental and somatic dysfunction of pelvic region: Secondary | ICD-10-CM | POA: Diagnosis not present

## 2019-01-17 DIAGNOSIS — M9903 Segmental and somatic dysfunction of lumbar region: Secondary | ICD-10-CM | POA: Diagnosis not present

## 2019-01-19 DIAGNOSIS — M5136 Other intervertebral disc degeneration, lumbar region: Secondary | ICD-10-CM | POA: Diagnosis not present

## 2019-01-19 DIAGNOSIS — M9905 Segmental and somatic dysfunction of pelvic region: Secondary | ICD-10-CM | POA: Diagnosis not present

## 2019-01-19 DIAGNOSIS — M955 Acquired deformity of pelvis: Secondary | ICD-10-CM | POA: Diagnosis not present

## 2019-01-19 DIAGNOSIS — M9903 Segmental and somatic dysfunction of lumbar region: Secondary | ICD-10-CM | POA: Diagnosis not present

## 2019-01-24 DIAGNOSIS — M5136 Other intervertebral disc degeneration, lumbar region: Secondary | ICD-10-CM | POA: Diagnosis not present

## 2019-01-24 DIAGNOSIS — M9903 Segmental and somatic dysfunction of lumbar region: Secondary | ICD-10-CM | POA: Diagnosis not present

## 2019-01-24 DIAGNOSIS — M9905 Segmental and somatic dysfunction of pelvic region: Secondary | ICD-10-CM | POA: Diagnosis not present

## 2019-01-24 DIAGNOSIS — M955 Acquired deformity of pelvis: Secondary | ICD-10-CM | POA: Diagnosis not present

## 2019-01-26 DIAGNOSIS — M9905 Segmental and somatic dysfunction of pelvic region: Secondary | ICD-10-CM | POA: Diagnosis not present

## 2019-01-26 DIAGNOSIS — M9903 Segmental and somatic dysfunction of lumbar region: Secondary | ICD-10-CM | POA: Diagnosis not present

## 2019-01-26 DIAGNOSIS — M5136 Other intervertebral disc degeneration, lumbar region: Secondary | ICD-10-CM | POA: Diagnosis not present

## 2019-01-26 DIAGNOSIS — M955 Acquired deformity of pelvis: Secondary | ICD-10-CM | POA: Diagnosis not present

## 2019-01-30 DIAGNOSIS — M955 Acquired deformity of pelvis: Secondary | ICD-10-CM | POA: Diagnosis not present

## 2019-01-30 DIAGNOSIS — M5136 Other intervertebral disc degeneration, lumbar region: Secondary | ICD-10-CM | POA: Diagnosis not present

## 2019-01-30 DIAGNOSIS — M9903 Segmental and somatic dysfunction of lumbar region: Secondary | ICD-10-CM | POA: Diagnosis not present

## 2019-01-30 DIAGNOSIS — M25561 Pain in right knee: Secondary | ICD-10-CM | POA: Diagnosis not present

## 2019-01-30 DIAGNOSIS — S83271A Complex tear of lateral meniscus, current injury, right knee, initial encounter: Secondary | ICD-10-CM | POA: Diagnosis not present

## 2019-01-30 DIAGNOSIS — M9905 Segmental and somatic dysfunction of pelvic region: Secondary | ICD-10-CM | POA: Diagnosis not present

## 2019-02-02 DIAGNOSIS — M25561 Pain in right knee: Secondary | ICD-10-CM | POA: Diagnosis not present

## 2019-02-03 DIAGNOSIS — M5136 Other intervertebral disc degeneration, lumbar region: Secondary | ICD-10-CM | POA: Diagnosis not present

## 2019-02-03 DIAGNOSIS — M9905 Segmental and somatic dysfunction of pelvic region: Secondary | ICD-10-CM | POA: Diagnosis not present

## 2019-02-03 DIAGNOSIS — M9903 Segmental and somatic dysfunction of lumbar region: Secondary | ICD-10-CM | POA: Diagnosis not present

## 2019-02-03 DIAGNOSIS — M955 Acquired deformity of pelvis: Secondary | ICD-10-CM | POA: Diagnosis not present

## 2019-02-06 DIAGNOSIS — M5136 Other intervertebral disc degeneration, lumbar region: Secondary | ICD-10-CM | POA: Diagnosis not present

## 2019-02-06 DIAGNOSIS — M955 Acquired deformity of pelvis: Secondary | ICD-10-CM | POA: Diagnosis not present

## 2019-02-06 DIAGNOSIS — M9905 Segmental and somatic dysfunction of pelvic region: Secondary | ICD-10-CM | POA: Diagnosis not present

## 2019-02-06 DIAGNOSIS — M9903 Segmental and somatic dysfunction of lumbar region: Secondary | ICD-10-CM | POA: Diagnosis not present

## 2019-02-07 DIAGNOSIS — M25661 Stiffness of right knee, not elsewhere classified: Secondary | ICD-10-CM | POA: Diagnosis not present

## 2019-02-07 DIAGNOSIS — M25561 Pain in right knee: Secondary | ICD-10-CM | POA: Diagnosis not present

## 2019-02-09 DIAGNOSIS — M5136 Other intervertebral disc degeneration, lumbar region: Secondary | ICD-10-CM | POA: Diagnosis not present

## 2019-02-09 DIAGNOSIS — M955 Acquired deformity of pelvis: Secondary | ICD-10-CM | POA: Diagnosis not present

## 2019-02-09 DIAGNOSIS — M9905 Segmental and somatic dysfunction of pelvic region: Secondary | ICD-10-CM | POA: Diagnosis not present

## 2019-02-09 DIAGNOSIS — M9903 Segmental and somatic dysfunction of lumbar region: Secondary | ICD-10-CM | POA: Diagnosis not present

## 2019-02-10 DIAGNOSIS — M25561 Pain in right knee: Secondary | ICD-10-CM | POA: Diagnosis not present

## 2019-02-10 DIAGNOSIS — M25661 Stiffness of right knee, not elsewhere classified: Secondary | ICD-10-CM | POA: Diagnosis not present

## 2019-02-13 DIAGNOSIS — M9905 Segmental and somatic dysfunction of pelvic region: Secondary | ICD-10-CM | POA: Diagnosis not present

## 2019-02-13 DIAGNOSIS — M955 Acquired deformity of pelvis: Secondary | ICD-10-CM | POA: Diagnosis not present

## 2019-02-13 DIAGNOSIS — M5136 Other intervertebral disc degeneration, lumbar region: Secondary | ICD-10-CM | POA: Diagnosis not present

## 2019-02-13 DIAGNOSIS — M9903 Segmental and somatic dysfunction of lumbar region: Secondary | ICD-10-CM | POA: Diagnosis not present

## 2019-02-14 DIAGNOSIS — M25561 Pain in right knee: Secondary | ICD-10-CM | POA: Diagnosis not present

## 2019-02-14 DIAGNOSIS — M25661 Stiffness of right knee, not elsewhere classified: Secondary | ICD-10-CM | POA: Diagnosis not present

## 2019-02-16 DIAGNOSIS — M955 Acquired deformity of pelvis: Secondary | ICD-10-CM | POA: Diagnosis not present

## 2019-02-16 DIAGNOSIS — L821 Other seborrheic keratosis: Secondary | ICD-10-CM | POA: Diagnosis not present

## 2019-02-16 DIAGNOSIS — M5136 Other intervertebral disc degeneration, lumbar region: Secondary | ICD-10-CM | POA: Diagnosis not present

## 2019-02-16 DIAGNOSIS — I8393 Asymptomatic varicose veins of bilateral lower extremities: Secondary | ICD-10-CM | POA: Diagnosis not present

## 2019-02-16 DIAGNOSIS — M9903 Segmental and somatic dysfunction of lumbar region: Secondary | ICD-10-CM | POA: Diagnosis not present

## 2019-02-16 DIAGNOSIS — M9905 Segmental and somatic dysfunction of pelvic region: Secondary | ICD-10-CM | POA: Diagnosis not present

## 2019-02-16 DIAGNOSIS — L814 Other melanin hyperpigmentation: Secondary | ICD-10-CM | POA: Diagnosis not present

## 2019-02-16 DIAGNOSIS — Z872 Personal history of diseases of the skin and subcutaneous tissue: Secondary | ICD-10-CM | POA: Diagnosis not present

## 2019-02-16 DIAGNOSIS — Z1283 Encounter for screening for malignant neoplasm of skin: Secondary | ICD-10-CM | POA: Diagnosis not present

## 2019-02-16 DIAGNOSIS — I781 Nevus, non-neoplastic: Secondary | ICD-10-CM | POA: Diagnosis not present

## 2019-02-16 DIAGNOSIS — D179 Benign lipomatous neoplasm, unspecified: Secondary | ICD-10-CM | POA: Diagnosis not present

## 2019-02-16 DIAGNOSIS — L578 Other skin changes due to chronic exposure to nonionizing radiation: Secondary | ICD-10-CM | POA: Diagnosis not present

## 2019-02-22 DIAGNOSIS — M9905 Segmental and somatic dysfunction of pelvic region: Secondary | ICD-10-CM | POA: Diagnosis not present

## 2019-02-22 DIAGNOSIS — M9903 Segmental and somatic dysfunction of lumbar region: Secondary | ICD-10-CM | POA: Diagnosis not present

## 2019-02-22 DIAGNOSIS — M955 Acquired deformity of pelvis: Secondary | ICD-10-CM | POA: Diagnosis not present

## 2019-02-22 DIAGNOSIS — M5136 Other intervertebral disc degeneration, lumbar region: Secondary | ICD-10-CM | POA: Diagnosis not present

## 2019-02-24 DIAGNOSIS — M9905 Segmental and somatic dysfunction of pelvic region: Secondary | ICD-10-CM | POA: Diagnosis not present

## 2019-02-24 DIAGNOSIS — M5136 Other intervertebral disc degeneration, lumbar region: Secondary | ICD-10-CM | POA: Diagnosis not present

## 2019-02-24 DIAGNOSIS — M955 Acquired deformity of pelvis: Secondary | ICD-10-CM | POA: Diagnosis not present

## 2019-02-24 DIAGNOSIS — M9903 Segmental and somatic dysfunction of lumbar region: Secondary | ICD-10-CM | POA: Diagnosis not present

## 2019-02-27 DIAGNOSIS — M25561 Pain in right knee: Secondary | ICD-10-CM | POA: Diagnosis not present

## 2019-02-27 DIAGNOSIS — M25661 Stiffness of right knee, not elsewhere classified: Secondary | ICD-10-CM | POA: Diagnosis not present

## 2019-02-28 DIAGNOSIS — M955 Acquired deformity of pelvis: Secondary | ICD-10-CM | POA: Diagnosis not present

## 2019-02-28 DIAGNOSIS — M9905 Segmental and somatic dysfunction of pelvic region: Secondary | ICD-10-CM | POA: Diagnosis not present

## 2019-02-28 DIAGNOSIS — M9903 Segmental and somatic dysfunction of lumbar region: Secondary | ICD-10-CM | POA: Diagnosis not present

## 2019-02-28 DIAGNOSIS — M5136 Other intervertebral disc degeneration, lumbar region: Secondary | ICD-10-CM | POA: Diagnosis not present

## 2019-03-02 DIAGNOSIS — M25661 Stiffness of right knee, not elsewhere classified: Secondary | ICD-10-CM | POA: Diagnosis not present

## 2019-03-02 DIAGNOSIS — M25561 Pain in right knee: Secondary | ICD-10-CM | POA: Diagnosis not present

## 2019-03-03 DIAGNOSIS — M9903 Segmental and somatic dysfunction of lumbar region: Secondary | ICD-10-CM | POA: Diagnosis not present

## 2019-03-03 DIAGNOSIS — M9905 Segmental and somatic dysfunction of pelvic region: Secondary | ICD-10-CM | POA: Diagnosis not present

## 2019-03-03 DIAGNOSIS — M5136 Other intervertebral disc degeneration, lumbar region: Secondary | ICD-10-CM | POA: Diagnosis not present

## 2019-03-03 DIAGNOSIS — M955 Acquired deformity of pelvis: Secondary | ICD-10-CM | POA: Diagnosis not present

## 2019-03-07 DIAGNOSIS — M25661 Stiffness of right knee, not elsewhere classified: Secondary | ICD-10-CM | POA: Insufficient documentation

## 2019-03-07 DIAGNOSIS — M9905 Segmental and somatic dysfunction of pelvic region: Secondary | ICD-10-CM | POA: Diagnosis not present

## 2019-03-07 DIAGNOSIS — M9903 Segmental and somatic dysfunction of lumbar region: Secondary | ICD-10-CM | POA: Diagnosis not present

## 2019-03-07 DIAGNOSIS — M25561 Pain in right knee: Secondary | ICD-10-CM | POA: Insufficient documentation

## 2019-03-07 DIAGNOSIS — M955 Acquired deformity of pelvis: Secondary | ICD-10-CM | POA: Diagnosis not present

## 2019-03-07 DIAGNOSIS — M5136 Other intervertebral disc degeneration, lumbar region: Secondary | ICD-10-CM | POA: Diagnosis not present

## 2019-03-09 DIAGNOSIS — M25561 Pain in right knee: Secondary | ICD-10-CM | POA: Diagnosis not present

## 2019-03-09 DIAGNOSIS — M25661 Stiffness of right knee, not elsewhere classified: Secondary | ICD-10-CM | POA: Diagnosis not present

## 2019-03-13 DIAGNOSIS — M25661 Stiffness of right knee, not elsewhere classified: Secondary | ICD-10-CM | POA: Diagnosis not present

## 2019-03-13 DIAGNOSIS — M25561 Pain in right knee: Secondary | ICD-10-CM | POA: Diagnosis not present

## 2019-03-14 DIAGNOSIS — M5136 Other intervertebral disc degeneration, lumbar region: Secondary | ICD-10-CM | POA: Diagnosis not present

## 2019-03-14 DIAGNOSIS — M9903 Segmental and somatic dysfunction of lumbar region: Secondary | ICD-10-CM | POA: Diagnosis not present

## 2019-03-14 DIAGNOSIS — M955 Acquired deformity of pelvis: Secondary | ICD-10-CM | POA: Diagnosis not present

## 2019-03-14 DIAGNOSIS — M9905 Segmental and somatic dysfunction of pelvic region: Secondary | ICD-10-CM | POA: Diagnosis not present

## 2019-03-15 DIAGNOSIS — M25661 Stiffness of right knee, not elsewhere classified: Secondary | ICD-10-CM | POA: Diagnosis not present

## 2019-03-15 DIAGNOSIS — M25561 Pain in right knee: Secondary | ICD-10-CM | POA: Diagnosis not present

## 2019-03-21 DIAGNOSIS — M955 Acquired deformity of pelvis: Secondary | ICD-10-CM | POA: Diagnosis not present

## 2019-03-21 DIAGNOSIS — M9903 Segmental and somatic dysfunction of lumbar region: Secondary | ICD-10-CM | POA: Diagnosis not present

## 2019-03-21 DIAGNOSIS — M5136 Other intervertebral disc degeneration, lumbar region: Secondary | ICD-10-CM | POA: Diagnosis not present

## 2019-03-21 DIAGNOSIS — M9905 Segmental and somatic dysfunction of pelvic region: Secondary | ICD-10-CM | POA: Diagnosis not present

## 2019-03-23 DIAGNOSIS — M9903 Segmental and somatic dysfunction of lumbar region: Secondary | ICD-10-CM | POA: Diagnosis not present

## 2019-03-23 DIAGNOSIS — M25561 Pain in right knee: Secondary | ICD-10-CM | POA: Diagnosis not present

## 2019-03-23 DIAGNOSIS — M5136 Other intervertebral disc degeneration, lumbar region: Secondary | ICD-10-CM | POA: Diagnosis not present

## 2019-03-23 DIAGNOSIS — M9905 Segmental and somatic dysfunction of pelvic region: Secondary | ICD-10-CM | POA: Diagnosis not present

## 2019-03-23 DIAGNOSIS — M25661 Stiffness of right knee, not elsewhere classified: Secondary | ICD-10-CM | POA: Diagnosis not present

## 2019-03-23 DIAGNOSIS — M955 Acquired deformity of pelvis: Secondary | ICD-10-CM | POA: Diagnosis not present

## 2019-03-28 DIAGNOSIS — M9905 Segmental and somatic dysfunction of pelvic region: Secondary | ICD-10-CM | POA: Diagnosis not present

## 2019-03-28 DIAGNOSIS — M955 Acquired deformity of pelvis: Secondary | ICD-10-CM | POA: Diagnosis not present

## 2019-03-28 DIAGNOSIS — M9903 Segmental and somatic dysfunction of lumbar region: Secondary | ICD-10-CM | POA: Diagnosis not present

## 2019-03-28 DIAGNOSIS — M5136 Other intervertebral disc degeneration, lumbar region: Secondary | ICD-10-CM | POA: Diagnosis not present

## 2019-04-04 DIAGNOSIS — M25561 Pain in right knee: Secondary | ICD-10-CM | POA: Diagnosis not present

## 2019-04-04 DIAGNOSIS — M25661 Stiffness of right knee, not elsewhere classified: Secondary | ICD-10-CM | POA: Diagnosis not present

## 2019-04-10 DIAGNOSIS — M955 Acquired deformity of pelvis: Secondary | ICD-10-CM | POA: Diagnosis not present

## 2019-04-10 DIAGNOSIS — M9903 Segmental and somatic dysfunction of lumbar region: Secondary | ICD-10-CM | POA: Diagnosis not present

## 2019-04-10 DIAGNOSIS — M9905 Segmental and somatic dysfunction of pelvic region: Secondary | ICD-10-CM | POA: Diagnosis not present

## 2019-04-10 DIAGNOSIS — M5136 Other intervertebral disc degeneration, lumbar region: Secondary | ICD-10-CM | POA: Diagnosis not present

## 2019-04-24 DIAGNOSIS — M9905 Segmental and somatic dysfunction of pelvic region: Secondary | ICD-10-CM | POA: Diagnosis not present

## 2019-04-24 DIAGNOSIS — M955 Acquired deformity of pelvis: Secondary | ICD-10-CM | POA: Diagnosis not present

## 2019-04-24 DIAGNOSIS — M9903 Segmental and somatic dysfunction of lumbar region: Secondary | ICD-10-CM | POA: Diagnosis not present

## 2019-04-24 DIAGNOSIS — M5136 Other intervertebral disc degeneration, lumbar region: Secondary | ICD-10-CM | POA: Diagnosis not present

## 2019-05-08 DIAGNOSIS — M5136 Other intervertebral disc degeneration, lumbar region: Secondary | ICD-10-CM | POA: Diagnosis not present

## 2019-05-08 DIAGNOSIS — M955 Acquired deformity of pelvis: Secondary | ICD-10-CM | POA: Diagnosis not present

## 2019-05-08 DIAGNOSIS — M9905 Segmental and somatic dysfunction of pelvic region: Secondary | ICD-10-CM | POA: Diagnosis not present

## 2019-05-08 DIAGNOSIS — M9903 Segmental and somatic dysfunction of lumbar region: Secondary | ICD-10-CM | POA: Diagnosis not present

## 2019-05-12 DIAGNOSIS — M955 Acquired deformity of pelvis: Secondary | ICD-10-CM | POA: Diagnosis not present

## 2019-05-12 DIAGNOSIS — M5136 Other intervertebral disc degeneration, lumbar region: Secondary | ICD-10-CM | POA: Diagnosis not present

## 2019-05-12 DIAGNOSIS — M9903 Segmental and somatic dysfunction of lumbar region: Secondary | ICD-10-CM | POA: Diagnosis not present

## 2019-05-12 DIAGNOSIS — M9905 Segmental and somatic dysfunction of pelvic region: Secondary | ICD-10-CM | POA: Diagnosis not present

## 2019-05-16 DIAGNOSIS — M5136 Other intervertebral disc degeneration, lumbar region: Secondary | ICD-10-CM | POA: Diagnosis not present

## 2019-05-16 DIAGNOSIS — I8391 Asymptomatic varicose veins of right lower extremity: Secondary | ICD-10-CM | POA: Diagnosis not present

## 2019-05-16 DIAGNOSIS — Z0000 Encounter for general adult medical examination without abnormal findings: Secondary | ICD-10-CM | POA: Diagnosis not present

## 2019-05-16 DIAGNOSIS — J449 Chronic obstructive pulmonary disease, unspecified: Secondary | ICD-10-CM | POA: Diagnosis not present

## 2019-05-16 DIAGNOSIS — M955 Acquired deformity of pelvis: Secondary | ICD-10-CM | POA: Diagnosis not present

## 2019-05-16 DIAGNOSIS — M9903 Segmental and somatic dysfunction of lumbar region: Secondary | ICD-10-CM | POA: Diagnosis not present

## 2019-05-16 DIAGNOSIS — H33009 Unspecified retinal detachment with retinal break, unspecified eye: Secondary | ICD-10-CM | POA: Diagnosis not present

## 2019-05-16 DIAGNOSIS — M9905 Segmental and somatic dysfunction of pelvic region: Secondary | ICD-10-CM | POA: Diagnosis not present

## 2019-05-16 DIAGNOSIS — M25461 Effusion, right knee: Secondary | ICD-10-CM | POA: Diagnosis not present

## 2019-05-22 DIAGNOSIS — H2512 Age-related nuclear cataract, left eye: Secondary | ICD-10-CM | POA: Diagnosis not present

## 2019-05-23 DIAGNOSIS — Z23 Encounter for immunization: Secondary | ICD-10-CM | POA: Diagnosis not present

## 2019-06-23 IMAGING — DX DG KNEE COMPLETE 4+V*R*
4 series · 4 of 4 positions shown · non-contrast
Comparison: None.

CLINICAL DATA: RIGHT knee injury, pain.

EXAM:
RIGHT KNEE - COMPLETE 4+ VIEW

[knee ap]
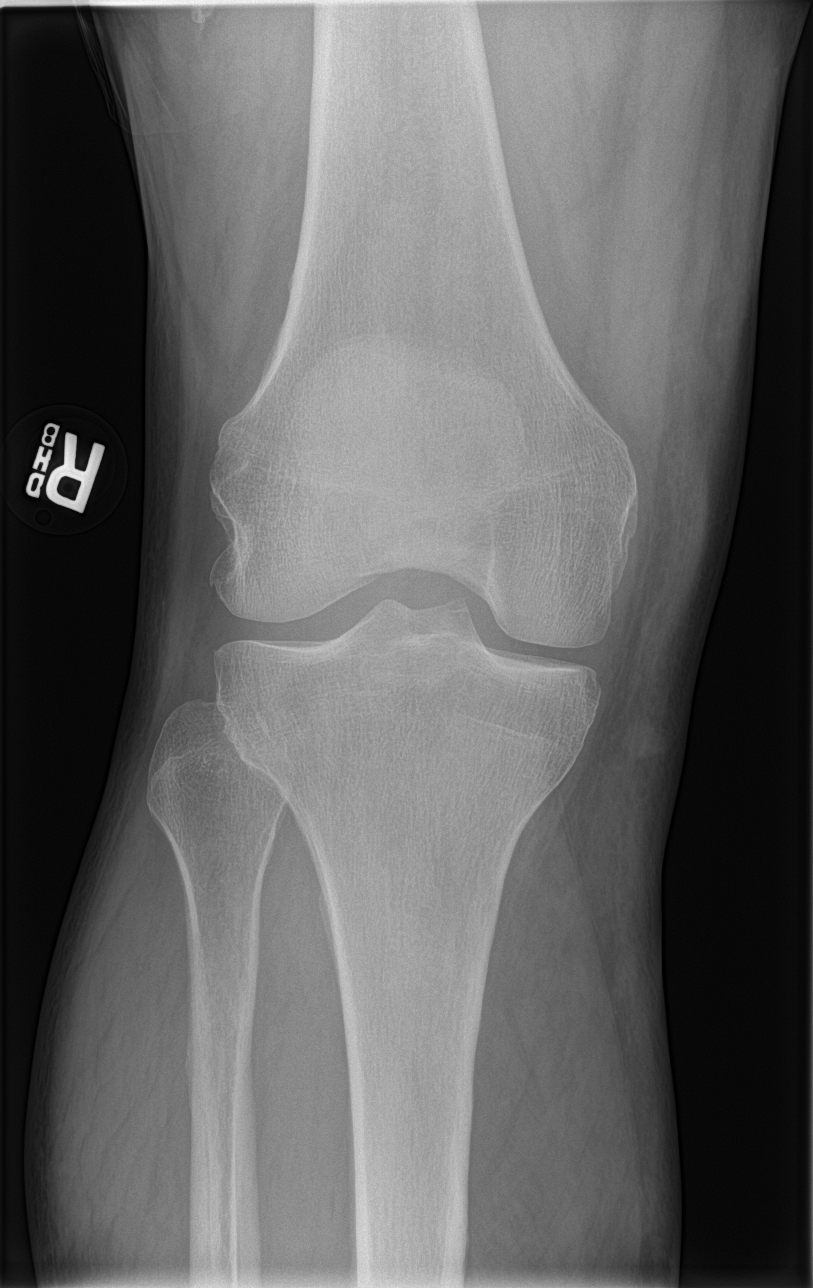

[knee lat]
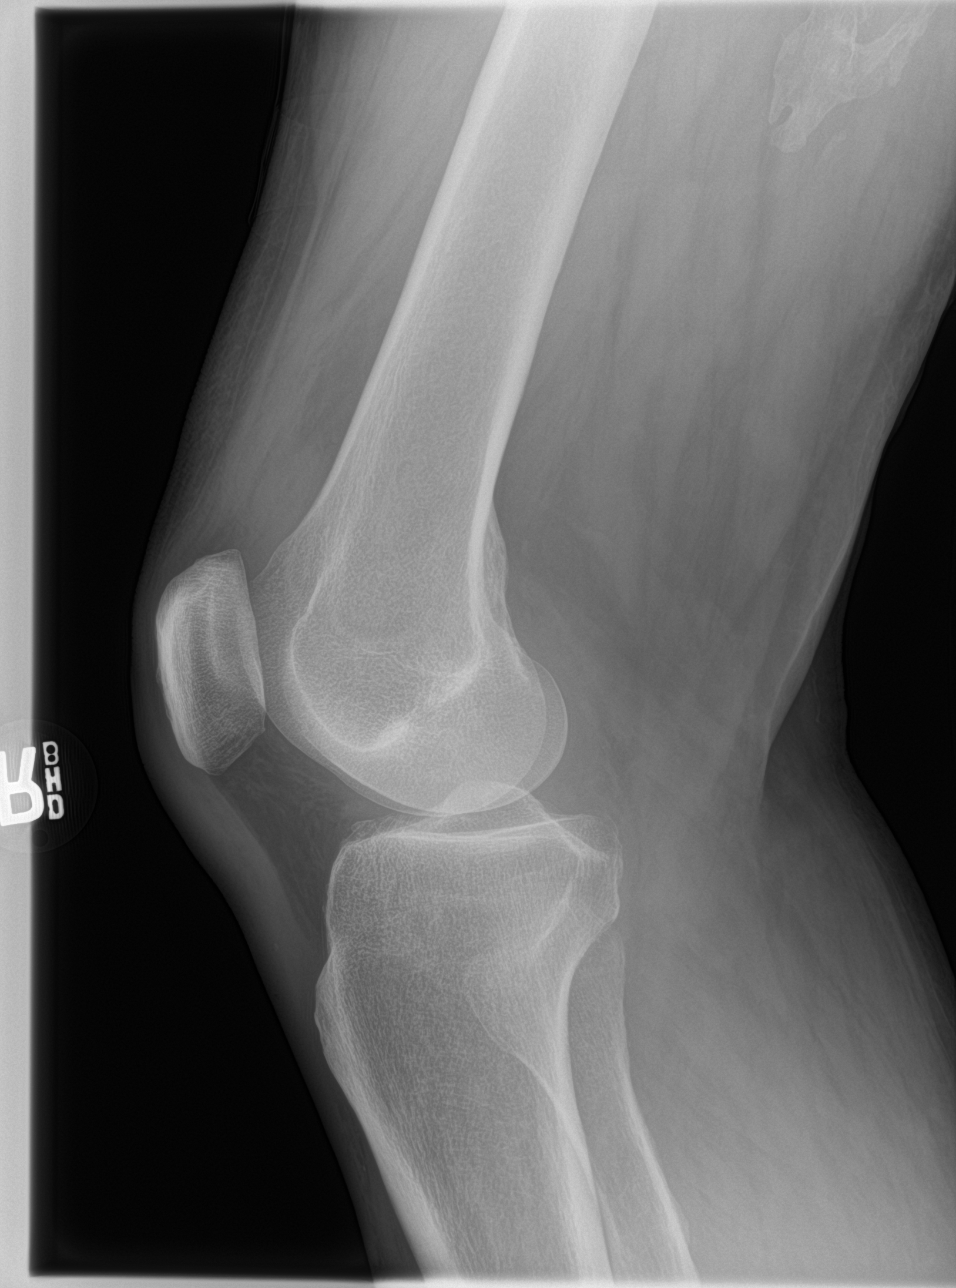

[knee obl (1 of 2)]
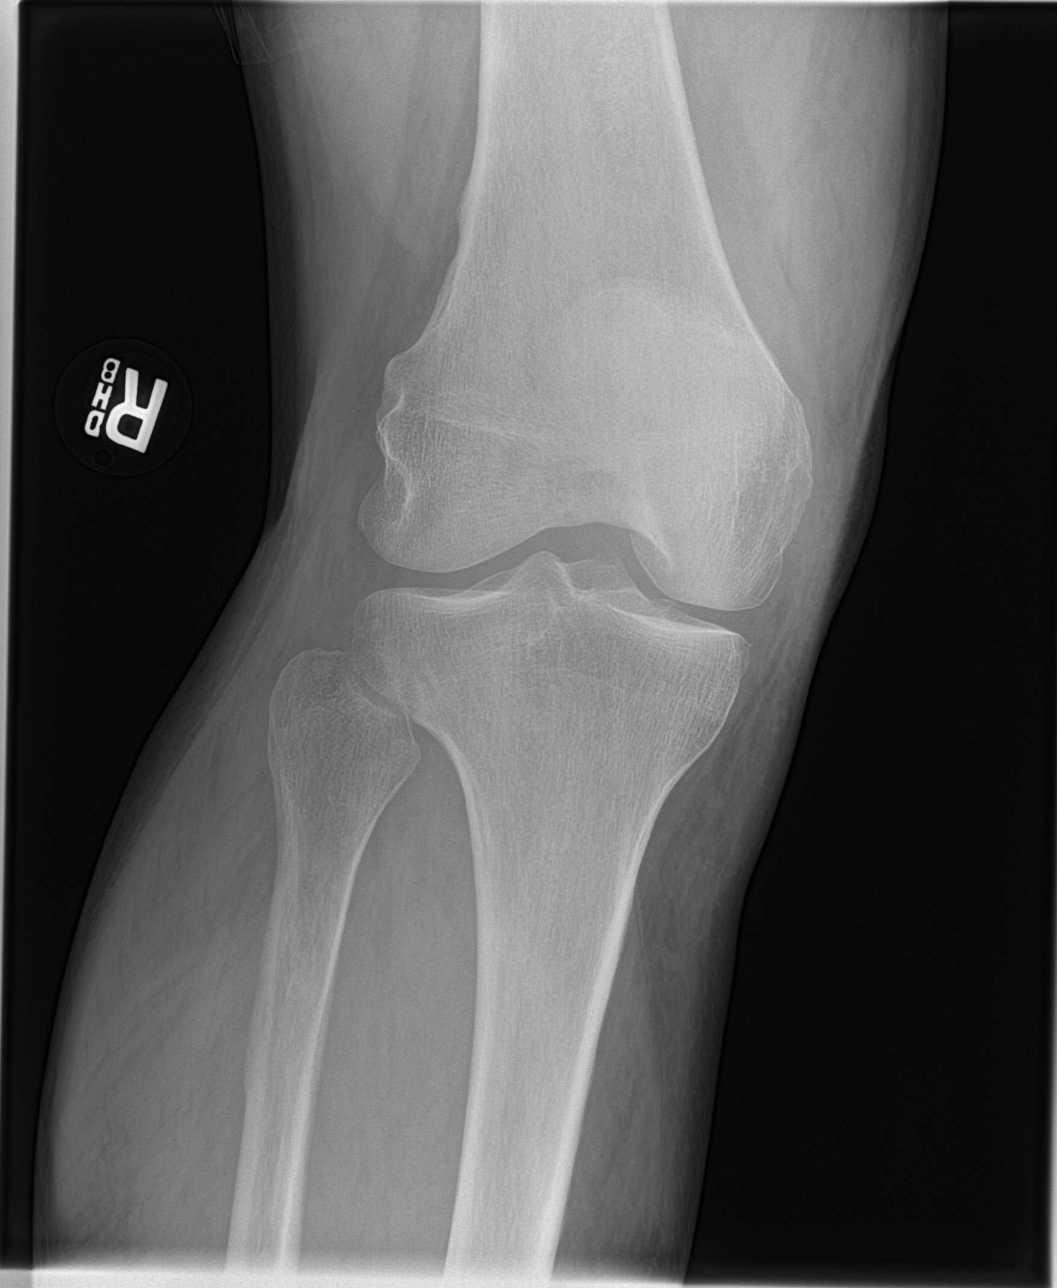

[knee obl (2 of 2)]
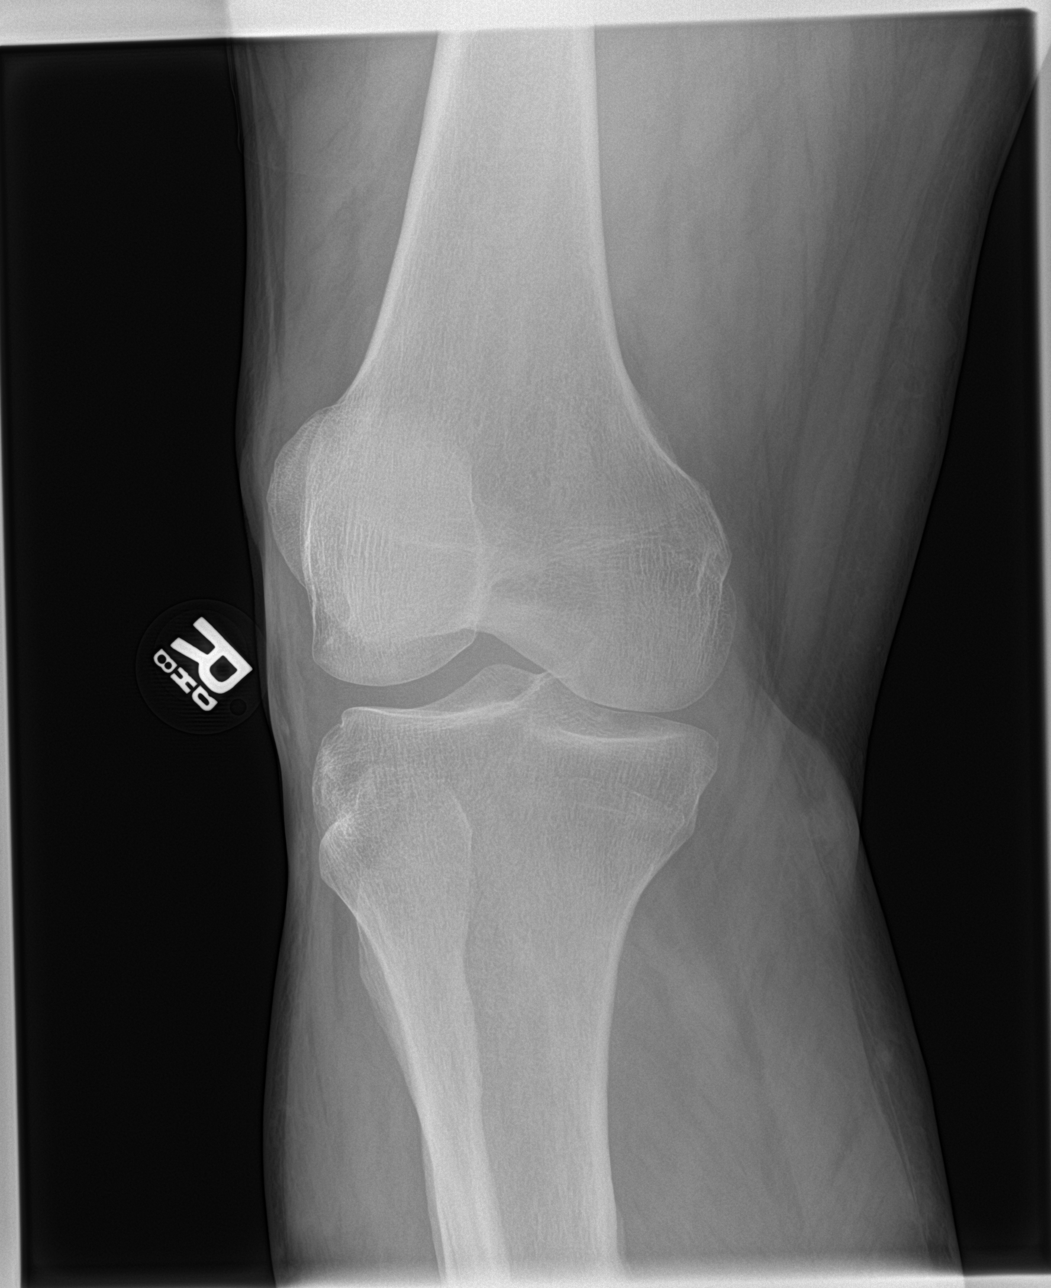

[4 of 4 positions shown; findings below may reference images not displayed]

FINDINGS: No evidence of fracture, dislocation, or joint effusion. No evidence
of arthropathy or other focal bone abnormality. Soft tissues are
unremarkable.
IMPRESSION: Negative.

## 2019-09-04 ENCOUNTER — Ambulatory Visit: Payer: Medicare Other | Attending: Internal Medicine

## 2019-09-04 DIAGNOSIS — Z23 Encounter for immunization: Secondary | ICD-10-CM

## 2019-09-04 NOTE — Progress Notes (Signed)
   [id number]  Name:  Gevorg Utrera.    MRN: [id number] DOB: Nov 13, 1945  09/04/2019  Mr. Deblock was observed post Covid-19 immunization for 15 minutes without incidence. He was provided with Vaccine Information Sheet and instruction to access the V-Safe system.   Mr. Daisy was instructed to call 911 with any severe reactions post vaccine: [name] Difficulty breathing  . Swelling of your face and throat  . A fast heartbeat  . A bad rash all over your body  . Dizziness and weakness    Immunizations Administered    Name Date Dose VIS Date Route   Moderna COVID-19 Vaccine 09/04/2019  3:47 PM 0.5 mL 06/13/2019 Intramuscular   Manufacturer: Moderna   Lot: [id number]   [id number]

## 2019-10-03 ENCOUNTER — Ambulatory Visit: Payer: Medicare Other | Attending: Internal Medicine

## 2019-10-03 ENCOUNTER — Other Ambulatory Visit: Payer: Self-pay

## 2019-10-03 DIAGNOSIS — Z23 Encounter for immunization: Secondary | ICD-10-CM

## 2019-10-03 NOTE — Progress Notes (Signed)
   [id number]  Name:  Caydence Fromm.    MRN: [id number] DOB: [birth year]  10/03/2019  Mr. Hatz was observed post Covid-19 immunization for 15 minutes without incident. He was provided with Vaccine Information Sheet and instruction to access the V-Safe system.   Mr. Laughman was instructed to call 911 with any severe reactions post vaccine: [name] Difficulty breathing  . Swelling of face and throat  . A fast heartbeat  . A bad rash all over body  . Dizziness and weakness   Immunizations Administered    Name Date Dose VIS Date Route   Moderna COVID-19 Vaccine 10/03/2019  3:42 PM 0.5 mL 06/13/2019 Intramuscular   Manufacturer: Levan Hurst   LotUT:[id number]   [id number]

## 2019-11-13 ENCOUNTER — Other Ambulatory Visit: Payer: Self-pay

## 2019-11-13 ENCOUNTER — Encounter: Payer: Self-pay | Admitting: Internal Medicine

## 2019-11-13 ENCOUNTER — Ambulatory Visit (INDEPENDENT_AMBULATORY_CARE_PROVIDER_SITE_OTHER): Payer: Medicare Other | Admitting: Internal Medicine

## 2019-11-13 VITALS — BP 124/83 | HR 93 | Ht 68.0 in | Wt 223.0 lb

## 2019-11-13 DIAGNOSIS — I10 Essential (primary) hypertension: Secondary | ICD-10-CM | POA: Diagnosis not present

## 2019-11-13 DIAGNOSIS — B029 Zoster without complications: Secondary | ICD-10-CM | POA: Diagnosis not present

## 2019-11-13 NOTE — Assessment & Plan Note (Signed)
Bp  Is stable , continue present med

## 2019-11-13 NOTE — Progress Notes (Signed)
Established Patient Office Visit  Subjective:  Patient ID: Paul Buckley., male    DOB: [birth year]  Age: 74 y.o. MRN: [id number]  CC:  Chief Complaint  Patient presents with  . Herpes Zoster    patient here for 2 week follow up after treatment for shingles     HPI Paul Buckley. presents for shingle check up , he is doing better , c/o crusting on lt side , no chest pain or, fever chills , no fogginess.  Past Medical History:  Diagnosis Date  . Colon, diverticulosis   . GERD (gastroesophageal reflux disease)   . History of colonic polyps   . Internal hemorrhoids   . Pneumonia     Past Surgical History:  Procedure Laterality Date  . APPENDECTOMY  1963  . COLONOSCOPY WITH PROPOFOL N/A 04/25/2018   Procedure: COLONOSCOPY WITH PROPOFOL;  Surgeon: Lollie Sails, MD;  Location: [facility] ENDOSCOPY;  Service: Endoscopy;  Laterality: N/A;  . EYE SURGERY  2013  . HERNIA REPAIR  1986    Family History  Problem Relation Age of Onset  . Cancer Mother   . Cancer Father   . Cancer Maternal Uncle   . Cancer Paternal Uncle   . Heart attack Paternal Grandfather     Social History   Socioeconomic History  . Marital status: Married    Spouse name: Not on file  . Number of children: Not on file  . Years of education: Not on file  . Highest education level: Not on file  Occupational History  . Not on file  Tobacco Use  . Smoking status: Former Smoker    Years: 50.00    Quit date: 07/14/1967    Years since quitting: 52.3  . Smokeless tobacco: Never Used  Substance and Sexual Activity  . Alcohol use: Not Currently  . Drug use: No  . Sexual activity: Not on file  Other Topics Concern  . Not on file  Social History Narrative  . Not on file   Social Determinants of Health   Financial Resource Strain:   . Difficulty of Paying Living Expenses:   Food Insecurity:   . Worried About Charity fundraiser in the Last Year:   . Arboriculturist in the Last Year:     Transportation Needs:   . Film/video editor (Medical):   [name] Lack of Transportation (Non-Medical):   Physical Activity:   . Days of Exercise per Week:   . Minutes of Exercise per Session:   Stress:   . Feeling of Stress :   Social Connections:   . Frequency of Communication with Friends and Family:   . Frequency of Social Gatherings with Friends and Family:   . Attends Religious Services:   . Active Member of Clubs or Organizations:   . Attends Archivist Meetings:   [name] Marital Status:   Intimate Partner Violence:   . Fear of Current or Ex-Partner:   . Emotionally Abused:   [name] Physically Abused:   . Sexually Abused:     Outpatient Medications Prior to Visit  Medication Sig Dispense Refill  . aspirin 81 MG tablet Take 81 mg by mouth once a week.     [name] atorvastatin (LIPITOR) 20 MG tablet Take 20 mg by mouth daily.  12  . lisinopril (PRINIVIL,ZESTRIL) 5 MG tablet Take 5 mg by mouth daily.    [name] losartan-hydrochlorothiazide (HYZAAR) 100-12.5 MG tablet Take 1 tablet by mouth daily.  3  . ranitidine (ZANTAC) 150 MG capsule Take 150 mg by mouth as needed.     . meloxicam (MOBIC) 15 MG tablet Take 1 tablet (15 mg total) by mouth daily. (Patient not taking: Reported on 05/16/2018) 60 tablet 3   Facility-Administered Medications Prior to Visit  Medication Dose Route Frequency Provider Last Rate Last Admin  . betamethasone acetate-betamethasone sodium phosphate (CELESTONE) injection 3 mg  3 mg Intramuscular Once Daylene Katayama M, DPM      . betamethasone acetate-betamethasone sodium phosphate (CELESTONE) injection 3 mg  3 mg Intramuscular Once Daylene Katayama M, DPM      . betamethasone acetate-betamethasone sodium phosphate (CELESTONE) injection 3 mg  3 mg Intramuscular Once Daylene Katayama M, DPM        No Known Allergies  ROS Review of Systems  Constitutional: Negative for chills and fever.  HENT: Negative for nosebleeds.   Eyes: Negative for pain.  Respiratory: Negative for  cough, choking, chest tightness, shortness of breath and wheezing.   Cardiovascular: Positive for chest pain (due to shingles).  Genitourinary: Negative.   Allergic/Immunologic: Negative for food allergies.  Hematological: Negative.       Objective:    Physical Exam  Constitutional: He is oriented to person, place, and time. He appears well-developed and well-nourished. No distress.  HENT:  Head: Normocephalic and atraumatic.  Eyes: Pupils are equal, round, and reactive to light. Right eye exhibits no discharge. No scleral icterus.  Cardiovascular: Normal rate and normal heart sounds.  No murmur heard. Pulmonary/Chest: Effort normal and breath sounds normal. No respiratory distress. He has no wheezes. He has no rales.  Abdominal: Bowel sounds are normal. He exhibits no mass. There is no abdominal tenderness.  Musculoskeletal:     Cervical back: Neck supple.  Neurological: He is alert and oriented to person, place, and time. Coordination normal.  Skin: He is not diaphoretic.  Psychiatric: He has a normal mood and affect. His behavior is normal. Judgment and thought content normal.    BP 124/83   Pulse 93   Ht 5\' 8"  (1.727 m)   Wt 223 lb (101.2 kg)   BMI 33.91 kg/m  Wt Readings from Last 3 Encounters:  11/13/19 223 lb (101.2 kg)  05/16/18 236 lb (107 kg)  04/25/18 230 lb (104.3 kg)     Health Maintenance Due  Topic Date Due  . Hepatitis C Screening  Never done  . TETANUS/TDAP  Never done  . PNA vac Low Risk Adult (1 of 2 - PCV13) Never done    There are no preventive care reminders to display for this patient.  Lab Results  Component Value Date   TSH 0.946 06/02/2016   Lab Results  Component Value Date   WBC 5.5 05/26/2016   HGB 14.3 05/26/2016   HCT 41.8 05/26/2016   MCV 94.3 05/26/2016   PLT 243 05/26/2016   Lab Results  Component Value Date   NA 140 06/02/2016   K 4.5 06/02/2016   CO2 29 06/02/2016   GLUCOSE 97 05/26/2016   BUN 20 05/26/2016    CREATININE 0.97 05/26/2016   AST 24 06/02/2016   ALBUMIN 3.8 06/02/2016   CALCIUM 8.9 06/02/2016   ANIONGAP 4 (L) 06/02/2016   Lab Results  Component Value Date   CHOL 108 05/26/2016   Lab Results  Component Value Date   HDL 42 05/26/2016   Lab Results  Component Value Date   LDLCALC 56 05/26/2016   Lab Results  Component Value Date  TRIG 49 05/26/2016   Lab Results  Component Value Date   CHOLHDL 2.6 05/26/2016   No results found for: HGBA1C    Assessment & Plan:   Problem List Items Addressed This Visit      Cardiovascular and Mediastinum   Essential hypertension    Bp  Is stable , continue present med        Other   Herpes zoster without complication - Primary    Pt looks good , finished  therapy         No orders of the defined types were placed in this encounter.   Follow-up: Return in about 6 weeks (around 12/25/2019) for follow  up.    Cletis Athens, MD

## 2019-11-13 NOTE — Assessment & Plan Note (Signed)
Pt looks good , finished  therapy

## 2019-11-14 ENCOUNTER — Ambulatory Visit: Payer: Medicare Other | Admitting: Internal Medicine

## 2019-12-01 ENCOUNTER — Encounter: Payer: Self-pay | Admitting: Internal Medicine

## 2019-12-01 ENCOUNTER — Ambulatory Visit (INDEPENDENT_AMBULATORY_CARE_PROVIDER_SITE_OTHER): Payer: Medicare Other | Admitting: Internal Medicine

## 2019-12-01 ENCOUNTER — Other Ambulatory Visit: Payer: Self-pay

## 2019-12-01 VITALS — BP 181/91 | HR 69 | Wt 225.3 lb

## 2019-12-01 DIAGNOSIS — B029 Zoster without complications: Secondary | ICD-10-CM | POA: Diagnosis not present

## 2019-12-01 DIAGNOSIS — I83893 Varicose veins of bilateral lower extremities with other complications: Secondary | ICD-10-CM

## 2019-12-01 DIAGNOSIS — J301 Allergic rhinitis due to pollen: Secondary | ICD-10-CM | POA: Diagnosis not present

## 2019-12-01 DIAGNOSIS — I10 Essential (primary) hypertension: Secondary | ICD-10-CM | POA: Diagnosis not present

## 2019-12-01 DIAGNOSIS — I872 Venous insufficiency (chronic) (peripheral): Secondary | ICD-10-CM

## 2019-12-01 NOTE — Assessment & Plan Note (Signed)
Stable

## 2019-12-01 NOTE — Assessment & Plan Note (Signed)
Shingle rash in the left side is healing well there is no blister formation there is no lymphadenopathy he has some pleuritic pain on the left side without any rales or rhonchi I told him to take Advil 1 to 2 tablets 3 times a day as needed for the pain.  He was advised to take Claritin for 10 mg p.o. daily for allergic rhinitis and itching.

## 2019-12-01 NOTE — Progress Notes (Addendum)
Established Patient Office Visit  Subjective:  Patient ID: Paul Hansley., male    DOB: March 10, 1946  Age: 74 y.o. MRN: [id number]  CC:  Chief Complaint  Patient presents with  . Herpes Zoster    Patient here for follow up on shingles    HPI  Paul Buckley. presents for checkup on short shingle he complains of itching and pain denies any history of any fever chill shortness of breath swelling in the neck or axilla.  Is about 3 weeks when he contacted shingle I told him that he can get his shingles shot in about 4 months.  Past Medical History:  Diagnosis Date  . Colon, diverticulosis   . GERD (gastroesophageal reflux disease)   . History of colonic polyps   . Internal hemorrhoids   . Pneumonia     Past Surgical History:  Procedure Laterality Date  . APPENDECTOMY  1963  . COLONOSCOPY WITH PROPOFOL N/A 04/25/2018   Procedure: COLONOSCOPY WITH PROPOFOL;  Surgeon: Lollie Sails, MD;  Location: [facility] ENDOSCOPY;  Service: Endoscopy;  Laterality: N/A;  . EYE SURGERY  2013  . HERNIA REPAIR  1986    Family History  Problem Relation Age of Onset  . Cancer Mother   . Cancer Father   . Cancer Maternal Uncle   . Cancer Paternal Uncle   . Heart attack Paternal Grandfather     Social History   Socioeconomic History  . Marital status: Married    Spouse name: Not on file  . Number of children: Not on file  . Years of education: Not on file  . Highest education level: Not on file  Occupational History  . Not on file  Tobacco Use  . Smoking status: Former Smoker    Years: 50.00    Quit date: 07/14/1967    Years since quitting: 52.4  . Smokeless tobacco: Never Used  Substance and Sexual Activity  . Alcohol use: Not Currently  . Drug use: No  . Sexual activity: Not on file  Other Topics Concern  . Not on file  Social History Narrative  . Not on file   Social Determinants of Health   Financial Resource Strain:   . Difficulty of Paying Living Expenses:   Food  Insecurity:   . Worried About Charity fundraiser in the Last Year:   . Arboriculturist in the Last Year:   Transportation Needs:   . Film/video editor (Medical):   [name] Lack of Transportation (Non-Medical):   Physical Activity:   . Days of Exercise per Week:   . Minutes of Exercise per Session:   Stress:   . Feeling of Stress :   Social Connections:   . Frequency of Communication with Friends and Family:   . Frequency of Social Gatherings with Friends and Family:   . Attends Religious Services:   . Active Member of Clubs or Organizations:   . Attends Archivist Meetings:   [name] Marital Status:   Intimate Partner Violence:   . Fear of Current or Ex-Partner:   . Emotionally Abused:   [name] Physically Abused:   . Sexually Abused:      Current Outpatient Medications:  .  aspirin 81 MG tablet, Take 81 mg by mouth once a week. , Disp: , Rfl:  .  atorvastatin (LIPITOR) 20 MG tablet, Take 20 mg by mouth daily., Disp: , Rfl: 12 .  lisinopril (PRINIVIL,ZESTRIL) 5 MG tablet, Take 5 mg  by mouth daily., Disp: , Rfl:  .  losartan-hydrochlorothiazide (HYZAAR) 100-12.5 MG tablet, Take 1 tablet by mouth daily., Disp: , Rfl: 3 .  meloxicam (MOBIC) 15 MG tablet, Take 1 tablet (15 mg total) by mouth daily. (Patient not taking: Reported on 05/16/2018), Disp: 60 tablet, Rfl: 3 .  ranitidine (ZANTAC) 150 MG capsule, Take 150 mg by mouth as needed. , Disp: , Rfl:   Current Facility-Administered Medications:  .  betamethasone acetate-betamethasone sodium phosphate (CELESTONE) injection 3 mg, 3 mg, Intramuscular, Once, Evans, Brent M, DPM .  betamethasone acetate-betamethasone sodium phosphate (CELESTONE) injection 3 mg, 3 mg, Intramuscular, Once, Evans, Brent M, DPM .  betamethasone acetate-betamethasone sodium phosphate (CELESTONE) injection 3 mg, 3 mg, Intramuscular, Once, Evans, Brent M, DPM   No Known Allergies  ROS Review of Systems  Constitutional: Negative.   HENT: Negative.   Eyes:  Negative.   Respiratory: Negative.   Cardiovascular: Negative.   Gastrointestinal: Negative.   Endocrine: Negative.   Genitourinary: Negative.   Musculoskeletal: Positive for back pain (shingle lt side of chest).  Allergic/Immunologic: Positive for environmental allergies.  Psychiatric/Behavioral: Negative.       Objective:    Physical Exam  Constitutional: He is oriented to person, place, and time. He appears well-developed and well-nourished.  HENT:  Head: Normocephalic and atraumatic.  Eyes: Pupils are equal, round, and reactive to light.  Neck: No JVD present. No tracheal deviation present. No thyromegaly present.  Cardiovascular: Normal rate.  No murmur heard. Pulmonary/Chest: Effort normal. No respiratory distress.  Healing rash of shingles on the left side of the chest  Lymphadenopathy:    He has no cervical adenopathy.  Neurological: He is alert and oriented to person, place, and time.  Skin: Rash noted.  Psychiatric: He has a normal mood and affect.    BP (!) 181/91   Pulse 69   Wt 225 lb 4.8 oz (102.2 kg)   BMI 34.26 kg/m  Wt Readings from Last 3 Encounters:  12/01/19 225 lb 4.8 oz (102.2 kg)  11/13/19 223 lb (101.2 kg)  05/16/18 236 lb (107 kg)     Health Maintenance Due  Topic Date Due  . Hepatitis C Screening  Never done  . TETANUS/TDAP  Never done  . PNA vac Low Risk Adult (1 of 2 - PCV13) Never done    There are no preventive care reminders to display for this patient.  Lab Results  Component Value Date   TSH 0.946 06/02/2016   Lab Results  Component Value Date   WBC 5.5 05/26/2016   HGB 14.3 05/26/2016   HCT 41.8 05/26/2016   MCV 94.3 05/26/2016   PLT 243 05/26/2016   Lab Results  Component Value Date   NA 140 06/02/2016   K 4.5 06/02/2016   CO2 29 06/02/2016   GLUCOSE 97 05/26/2016   BUN 20 05/26/2016   CREATININE 0.97 05/26/2016   AST 24 06/02/2016   ALBUMIN 3.8 06/02/2016   CALCIUM 8.9 06/02/2016   ANIONGAP 4 (L) 06/02/2016    Lab Results  Component Value Date   CHOL 108 05/26/2016   Lab Results  Component Value Date   HDL 42 05/26/2016   Lab Results  Component Value Date   LDLCALC 56 05/26/2016   Lab Results  Component Value Date   TRIG 49 05/26/2016   Lab Results  Component Value Date   CHOLHDL 2.6 05/26/2016   No results found for: HGBA1C    Assessment & Plan:   Problem List  Items Addressed This Visit      Cardiovascular and Mediastinum   Varicose veins of bilateral lower extremities with other complications    .      Essential hypertension    Stable.      Chronic venous insufficiency     Respiratory   Seasonal allergic rhinitis due to pollen     Other   Herpes zoster without complication - Primary    Shingle rash in the left side is healing well there is no blister formation there is no lymphadenopathy he has some pleuritic pain on the left side without any rales or rhonchi I told him to take Advil 1 to 2 tablets 3 times a day as needed for the pain.  He was advised to take Claritin for 10 mg p.o. daily for allergic rhinitis and itching.       Stable at the present time.  No orders of the defined types were placed in this encounter.     Follow-up: Return in about 3 months (around 03/02/2020).    Cletis Athens, MD

## 2019-12-25 ENCOUNTER — Ambulatory Visit (INDEPENDENT_AMBULATORY_CARE_PROVIDER_SITE_OTHER): Payer: Medicare Other | Admitting: Internal Medicine

## 2019-12-25 ENCOUNTER — Encounter: Payer: Self-pay | Admitting: Internal Medicine

## 2019-12-25 ENCOUNTER — Other Ambulatory Visit: Payer: Self-pay

## 2019-12-25 VITALS — BP 150/76 | HR 84 | Ht 72.0 in | Wt 225.6 lb

## 2019-12-25 DIAGNOSIS — B029 Zoster without complications: Secondary | ICD-10-CM

## 2019-12-25 DIAGNOSIS — I10 Essential (primary) hypertension: Secondary | ICD-10-CM | POA: Diagnosis not present

## 2019-12-25 DIAGNOSIS — I872 Venous insufficiency (chronic) (peripheral): Secondary | ICD-10-CM | POA: Diagnosis not present

## 2019-12-25 DIAGNOSIS — I83893 Varicose veins of bilateral lower extremities with other complications: Secondary | ICD-10-CM

## 2019-12-25 NOTE — Patient Instructions (Signed)
DASH Eating Plan DASH stands for "Dietary Approaches to Stop Hypertension." The DASH eating plan is a healthy eating plan that has been shown to reduce high blood pressure (hypertension). It may also reduce your risk for type 2 diabetes, heart disease, and stroke. The DASH eating plan may also help with weight loss. What are tips for following this plan?  General guidelines  Avoid eating more than 2,300 mg (milligrams) of salt (sodium) a day. If you have hypertension, you may need to reduce your sodium intake to 1,500 mg a day.  Limit alcohol intake to no more than 1 drink a day for nonpregnant women and 2 drinks a day for men. One drink equals 12 oz of beer, 5 oz of wine, or 1 oz of hard liquor.  Work with your health care provider to maintain a healthy body weight or to lose weight. Ask what an ideal weight is for you.  Get at least 30 minutes of exercise that causes your heart to beat faster (aerobic exercise) most days of the week. Activities may include walking, swimming, or biking.  Work with your health care provider or diet and nutrition specialist (dietitian) to adjust your eating plan to your individual calorie needs. Reading food labels   Check food labels for the amount of sodium per serving. Choose foods with less than 5 percent of the Daily Value of sodium. Generally, foods with less than 300 mg of sodium per serving fit into this eating plan.  To find whole grains, look for the word "whole" as the first word in the ingredient list. Shopping  Buy products labeled as "low-sodium" or "no salt added."  Buy fresh foods. Avoid canned foods and premade or frozen meals. Cooking  Avoid adding salt when cooking. Use salt-free seasonings or herbs instead of table salt or sea salt. Check with your health care provider or pharmacist before using salt substitutes.  Do not fry foods. Cook foods using healthy methods such as baking, boiling, grilling, and broiling instead.  Cook with  heart-healthy oils, such as olive, canola, soybean, or sunflower oil. Meal planning  Eat a balanced diet that includes: ? 5 or more servings of fruits and vegetables each day. At each meal, try to fill half of your plate with fruits and vegetables. ? Up to 6-8 servings of whole grains each day. ? Less than 6 oz of lean meat, poultry, or fish each day. A 3-oz serving of meat is about the same size as a deck of cards. One egg equals 1 oz. ? 2 servings of low-fat dairy each day. ? A serving of nuts, seeds, or beans 5 times each week. ? Heart-healthy fats. Healthy fats called Omega-3 fatty acids are found in foods such as flaxseeds and coldwater fish, like sardines, salmon, and mackerel.  Limit how much you eat of the following: ? Canned or prepackaged foods. ? Food that is high in trans fat, such as fried foods. ? Food that is high in saturated fat, such as fatty meat. ? Sweets, desserts, sugary drinks, and other foods with added sugar. ? Full-fat dairy products.  Do not salt foods before eating.  Try to eat at least 2 vegetarian meals each week.  Eat more home-cooked food and less restaurant, buffet, and fast food.  When eating at a restaurant, ask that your food be prepared with less salt or no salt, if possible. What foods are recommended? The items listed may not be a complete list. Talk with your dietitian about   what dietary choices are best for you. Grains Whole-grain or whole-wheat bread. Whole-grain or whole-wheat pasta. Brown rice. Oatmeal. Quinoa. Bulgur. Whole-grain and low-sodium cereals. Pita bread. Low-fat, low-sodium crackers. Whole-wheat flour tortillas. Vegetables Fresh or frozen vegetables (raw, steamed, roasted, or grilled). Low-sodium or reduced-sodium tomato and vegetable juice. Low-sodium or reduced-sodium tomato sauce and tomato paste. Low-sodium or reduced-sodium canned vegetables. Fruits All fresh, dried, or frozen fruit. Canned fruit in natural juice (without  added sugar). Meat and other protein foods Skinless chicken or turkey. Ground chicken or turkey. Pork with fat trimmed off. Fish and seafood. Egg whites. Dried beans, peas, or lentils. Unsalted nuts, nut butters, and seeds. Unsalted canned beans. Lean cuts of beef with fat trimmed off. Low-sodium, lean deli meat. Dairy Low-fat (1%) or fat-free (skim) milk. Fat-free, low-fat, or reduced-fat cheeses. Nonfat, low-sodium ricotta or cottage cheese. Low-fat or nonfat yogurt. Low-fat, low-sodium cheese. Fats and oils Soft margarine without trans fats. Vegetable oil. Low-fat, reduced-fat, or light mayonnaise and salad dressings (reduced-sodium). Canola, safflower, olive, soybean, and sunflower oils. Avocado. Seasoning and other foods Herbs. Spices. Seasoning mixes without salt. Unsalted popcorn and pretzels. Fat-free sweets. What foods are not recommended? The items listed may not be a complete list. Talk with your dietitian about what dietary choices are best for you. Grains Baked goods made with fat, such as croissants, muffins, or some breads. Dry pasta or rice meal packs. Vegetables Creamed or fried vegetables. Vegetables in a cheese sauce. Regular canned vegetables (not low-sodium or reduced-sodium). Regular canned tomato sauce and paste (not low-sodium or reduced-sodium). Regular tomato and vegetable juice (not low-sodium or reduced-sodium). Pickles. Olives. Fruits Canned fruit in a light or heavy syrup. Fried fruit. Fruit in cream or butter sauce. Meat and other protein foods Fatty cuts of meat. Ribs. Fried meat. Bacon. Sausage. Bologna and other processed lunch meats. Salami. Fatback. Hotdogs. Bratwurst. Salted nuts and seeds. Canned beans with added salt. Canned or smoked fish. Whole eggs or egg yolks. Chicken or turkey with skin. Dairy Whole or 2% milk, cream, and half-and-half. Whole or full-fat cream cheese. Whole-fat or sweetened yogurt. Full-fat cheese. Nondairy creamers. Whipped toppings.  Processed cheese and cheese spreads. Fats and oils Butter. Stick margarine. Lard. Shortening. Ghee. Bacon fat. Tropical oils, such as coconut, palm kernel, or palm oil. Seasoning and other foods Salted popcorn and pretzels. Onion salt, garlic salt, seasoned salt, table salt, and sea salt. Worcestershire sauce. Tartar sauce. Barbecue sauce. Teriyaki sauce. Soy sauce, including reduced-sodium. Steak sauce. Canned and packaged gravies. Fish sauce. Oyster sauce. Cocktail sauce. Horseradish that you find on the shelf. Ketchup. Mustard. Meat flavorings and tenderizers. Bouillon cubes. Hot sauce and Tabasco sauce. Premade or packaged marinades. Premade or packaged taco seasonings. Relishes. Regular salad dressings. Where to find more information:  National Heart, Lung, and Blood Institute: www.nhlbi.nih.gov  American Heart Association: www.heart.org Summary  The DASH eating plan is a healthy eating plan that has been shown to reduce high blood pressure (hypertension). It may also reduce your risk for type 2 diabetes, heart disease, and stroke.  With the DASH eating plan, you should limit salt (sodium) intake to 2,300 mg a day. If you have hypertension, you may need to reduce your sodium intake to 1,500 mg a day.  When on the DASH eating plan, aim to eat more fresh fruits and vegetables, whole grains, lean proteins, low-fat dairy, and heart-healthy fats.  Work with your health care provider or diet and nutrition specialist (dietitian) to adjust your eating plan to your   individual calorie needs. This information is not intended to replace advice given to you by your health care provider. Make sure you discuss any questions you have with your health care provider. Document Revised: 06/11/2017 Document Reviewed: 06/22/2016 Elsevier Patient Education  2020 Elsevier Inc.  

## 2019-12-25 NOTE — Assessment & Plan Note (Signed)
Stable

## 2019-12-25 NOTE — Assessment & Plan Note (Signed)
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

## 2019-12-25 NOTE — Assessment & Plan Note (Signed)
Patient was advised to lose weight walk daily.  He is under a lot of stress lately because of the sickness of his wife he does not want to put her in a nursing facility.

## 2019-12-25 NOTE — Progress Notes (Signed)
Established Patient Office Visit  SUBJECTIVE:  Patient ID: Paul Levee., male    DOB: 08-Jul-1946  Age: 74 y.o. MRN: [id number]  CC: Left-sided chest pain  HPI Paul Buckley. presents for shingle neuralgia on the left side.  He says he was on the more on the front of the chest and in the back. currently taking advil to relieve it.  Past Medical History:  Diagnosis Date  . Colon, diverticulosis   . GERD (gastroesophageal reflux disease)   . History of colonic polyps   . Internal hemorrhoids   . Pneumonia     Past Surgical History:  Procedure Laterality Date  . APPENDECTOMY  1963  . COLONOSCOPY WITH PROPOFOL N/A 04/25/2018   Procedure: COLONOSCOPY WITH PROPOFOL;  Surgeon: Lollie Sails, MD;  Location: [facility] ENDOSCOPY;  Service: Endoscopy;  Laterality: N/A;  . EYE SURGERY  2013  . HERNIA REPAIR  1986    Family History  Problem Relation Age of Onset  . Cancer Mother   . Cancer Father   . Cancer Maternal Uncle   . Cancer Paternal Uncle   . Heart attack Paternal Grandfather     Social History   Socioeconomic History  . Marital status: Married    Spouse name: Not on file  . Number of children: Not on file  . Years of education: Not on file  . Highest education level: Not on file  Occupational History  . Not on file  Tobacco Use  . Smoking status: Former Smoker    Years: 50.00    Quit date: 07/14/1967    Years since quitting: 52.4  . Smokeless tobacco: Never Used  Vaping Use  . Vaping Use: Never used  Substance and Sexual Activity  . Alcohol use: Not Currently  . Drug use: No  . Sexual activity: Not on file  Other Topics Concern  . Not on file  Social History Narrative  . Not on file   Social Determinants of Health   Financial Resource Strain:   . Difficulty of Paying Living Expenses:   Food Insecurity:   . Worried About Charity fundraiser in the Last Year:   . Arboriculturist in the Last Year:   Transportation Needs:   . Lexicographer (Medical):   [name] Lack of Transportation (Non-Medical):   Physical Activity:   . Days of Exercise per Week:   . Minutes of Exercise per Session:   Stress:   . Feeling of Stress :   Social Connections:   . Frequency of Communication with Friends and Family:   . Frequency of Social Gatherings with Friends and Family:   . Attends Religious Services:   . Active Member of Clubs or Organizations:   . Attends Archivist Meetings:   [name] Marital Status:   Intimate Partner Violence:   . Fear of Current or Ex-Partner:   . Emotionally Abused:   [name] Physically Abused:   . Sexually Abused:      Current Outpatient Medications:  .  aspirin 81 MG tablet, Take 81 mg by mouth once a week. , Disp: , Rfl:  .  atorvastatin (LIPITOR) 20 MG tablet, Take 20 mg by mouth daily., Disp: , Rfl: 12 .  lisinopril (PRINIVIL,ZESTRIL) 5 MG tablet, Take 5 mg by mouth daily., Disp: , Rfl:  .  losartan-hydrochlorothiazide (HYZAAR) 100-12.5 MG tablet, Take 1 tablet by mouth daily., Disp: , Rfl: 3 .  meloxicam (MOBIC) 15 MG tablet,  Take 1 tablet (15 mg total) by mouth daily., Disp: 60 tablet, Rfl: 3 .  ranitidine (ZANTAC) 150 MG capsule, Take 150 mg by mouth as needed. , Disp: , Rfl:   Current Facility-Administered Medications:  .  betamethasone acetate-betamethasone sodium phosphate (CELESTONE) injection 3 mg, 3 mg, Intramuscular, Once, Paul, Brent M, DPM .  betamethasone acetate-betamethasone sodium phosphate (CELESTONE) injection 3 mg, 3 mg, Intramuscular, Once, Paul, Brent M, DPM .  betamethasone acetate-betamethasone sodium phosphate (CELESTONE) injection 3 mg, 3 mg, Intramuscular, Once, Paul, Brent M, DPM   No Known Allergies  ROS Review of Systems  Constitutional: Negative for activity change and appetite change.  HENT: Negative.   Eyes: Negative.   Cardiovascular: Negative.   Gastrointestinal: Negative.   Endocrine: Negative.   Genitourinary: Positive for difficulty urinating.    Musculoskeletal: Negative.   Neurological: Negative.  Negative for tremors.  Psychiatric/Behavioral: Negative for agitation and dysphoric mood.      OBJECTIVE:    Constitutional: The patient is oriented to person, place, and time. Pt appears well-developed and well-nourished.  Head: Normocephalic and atraumatic.  Eyes: Pupils are equal, round, and reactive to light.  Neck: No JVD present. No tracheal deviation present. No thyromegaly present.  Cardiovascular: Regular rate and rhythm. No gallop. Pulmonary/Chest: Normal breath sounds. Lungs clear to auscultation. Abdominal: No abdominal tenderness. No guarding or rebound tenderness. No hepatosplenomegaly. Musculoskeletal: Normal range of motion.  Lymphatic: No cervical adenopathy.  Neurological: No cranial nerve deficit.  Skin: Skin has rash  Lt side of chest Psychiatric: The patient has a normal mood and affect.   BP (!) 150/76   Pulse 84   Ht 6' (1.829 m)   Wt 225 lb 9.6 oz (102.3 kg)   BMI 30.60 kg/m  Wt Readings from Last 3 Encounters:  12/25/19 225 lb 9.6 oz (102.3 kg)  12/01/19 225 lb 4.8 oz (102.2 kg)  11/13/19 223 lb (101.2 kg)    Health Maintenance Due  Topic Date Due  . Hepatitis C Screening  Never done  . TETANUS/TDAP  Never done  . PNA vac Low Risk Adult (1 of 2 - PCV13) Never done    There are no preventive care reminders to display for this patient.  CBC Latest Ref Rng & Units 05/26/2016 07/04/2014  WBC 3.8 - 10.6 K/uL 5.5 6.8  Hemoglobin 13.0 - 18.0 g/dL 14.3 12.7(L)  Hematocrit 40 - 52 % 41.8 38.2(L)  Platelets 150 - 440 K/uL 243 247   CMP Latest Ref Rng & Units 06/02/2016 05/26/2016 07/04/2014  Glucose 65 - 99 mg/dL - 97 169(H)  BUN 6 - 20 mg/dL - 20 20(H)  Creatinine 0.61 - 1.24 mg/dL - 0.97 1.50(H)  Sodium 135 - 145 mmol/L 140 138 135(L)  Potassium 3.5 - 5.1 mmol/L 4.5 4.2 3.7  Chloride 101 - 111 mmol/L 107 105 102  CO2 22 - 32 mmol/L 29 29 26   Calcium 8.9 - 10.3 mg/dL 8.9 8.4(L) 8.6  AST  15 - 41 U/L 24 - -    Lab Results  Component Value Date   TSH 0.946 06/02/2016   Lab Results  Component Value Date   ALBUMIN 3.8 06/02/2016   ANIONGAP 4 (L) 06/02/2016   Lab Results  Component Value Date   CHOL 108 05/26/2016   HDL 42 05/26/2016   LDLCALC 56 05/26/2016   CHOLHDL 2.6 05/26/2016   Lab Results  Component Value Date   TRIG 49 05/26/2016   No results found for: HGBA1C    ASSESSMENT &  PLAN:   Problem List Items Addressed This Visit      Cardiovascular and Mediastinum   Varicose veins of bilateral lower extremities with other complications    Stable      Essential hypertension - Primary    Patient was advised to lose weight walk daily.  He is under a lot of stress lately because of the sickness of his wife he does not want to put her in a nursing facility.      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  1. Essential hypertension Was started on DASH diet.  2. Chronic venous insufficiency .  3. Varicose veins of bilateral lower extremities with other complications ) Are stable at the present time.  4. Herpes zoster without complication Patient has a scar on thechest on the left side ,no blistering. [name] He complains of pain in the anterior chest that he can take some Benadryl at night if it does not bother his prostate and he can take combination of Advil 2 tablet and one Tylenol to relieve the pain  if the pain is still persistent and keep coming back then he can call me and we will try gabapentin Follow-up: No follow-ups on file.    Dr. Jane Canary [facility] [address], [place], [place] [address]   By signing my name below, I, [employer], attest that this documentation has been prepared under the direction of Cletis Athens, MD. Electronically Signed: Cletis Athens, MD 12/25/19, 9:46 AM   I personally performed the services described in this documentation, which was SCRIBED in my presence. The recorded information has been reviewed and considered accurate. It has been edited as necessary during review. Cletis Athens, MD

## 2020-01-05 ENCOUNTER — Encounter: Payer: Self-pay | Admitting: Podiatry

## 2020-01-05 ENCOUNTER — Other Ambulatory Visit: Payer: Self-pay

## 2020-01-05 ENCOUNTER — Ambulatory Visit (INDEPENDENT_AMBULATORY_CARE_PROVIDER_SITE_OTHER): Payer: Medicare Other | Admitting: Podiatry

## 2020-01-05 DIAGNOSIS — B351 Tinea unguium: Secondary | ICD-10-CM

## 2020-01-05 DIAGNOSIS — M79674 Pain in right toe(s): Secondary | ICD-10-CM | POA: Diagnosis not present

## 2020-01-05 DIAGNOSIS — L989 Disorder of the skin and subcutaneous tissue, unspecified: Secondary | ICD-10-CM | POA: Diagnosis not present

## 2020-01-05 DIAGNOSIS — M79675 Pain in left toe(s): Secondary | ICD-10-CM | POA: Diagnosis not present

## 2020-01-05 NOTE — Progress Notes (Signed)
   SUBJECTIVE Patient presents to office today complaining of elongated, thickened nails that cause pain while ambulating in shoes.  He is unable to trim his own nails.  Patient also has a symptomatic callus lesion overlying the fifth toe of the right foot.  Patient is here for further evaluation and treatment.  Past Medical History:  Diagnosis Date  . Colon, diverticulosis   . GERD (gastroesophageal reflux disease)   . History of colonic polyps   . Internal hemorrhoids   . Pneumonia     OBJECTIVE General Patient is awake, alert, and oriented x 3 and in no acute distress. Derm Skin is dry and supple bilateral. Negative open lesions or macerations. Remaining integument unremarkable. Nails are tender, long, thickened and dystrophic with subungual debris, consistent with onychomycosis, 1-5 bilateral. No signs of infection noted.  There is a hyperkeratotic preulcerative callus lesion noted overlying the PIPJ of the fifth digit right foot Vasc  DP and PT pedal pulses palpable bilaterally. Temperature gradient within normal limits.  Neuro Epicritic and protective threshold sensation grossly intact bilaterally.  Musculoskeletal Exam No symptomatic pedal deformities noted bilateral. Muscular strength within normal limits.  ASSESSMENT 1. Onychodystrophic nails 1-5 bilateral with hyperkeratosis of nails.  2. Onychomycosis of nail due to dermatophyte bilateral 3.  Preulcerative callus lesion/corn fifth digit right foot pain in foot bilateral  PLAN OF CARE 1. Patient evaluated today.  2. Instructed to maintain good pedal hygiene and foot care.  3. Mechanical debridement of nails 1-5 bilaterally performed using a nail nipper. Filed with dremel without incident.  4.  Excisional debridement of the callus lesion to the fifth digit right foot was performed using a tissue nipper without incident or bleeding  5.  Return to clinic in 3 mos.    Edrick Kins, DPM [facility]  Dr. Edrick Kins, [address]                                        [place], [place] [address]                Office [phone]  Fax [phone]

## 2020-03-04 ENCOUNTER — Encounter: Payer: Self-pay | Admitting: Internal Medicine

## 2020-03-04 ENCOUNTER — Other Ambulatory Visit: Payer: Self-pay

## 2020-03-04 ENCOUNTER — Ambulatory Visit (INDEPENDENT_AMBULATORY_CARE_PROVIDER_SITE_OTHER): Payer: Medicare Other | Admitting: Internal Medicine

## 2020-03-04 DIAGNOSIS — E785 Hyperlipidemia, unspecified: Secondary | ICD-10-CM | POA: Diagnosis not present

## 2020-03-04 DIAGNOSIS — I10 Essential (primary) hypertension: Secondary | ICD-10-CM

## 2020-03-04 DIAGNOSIS — B029 Zoster without complications: Secondary | ICD-10-CM

## 2020-03-04 DIAGNOSIS — I83893 Varicose veins of bilateral lower extremities with other complications: Secondary | ICD-10-CM

## 2020-03-04 NOTE — Assessment & Plan Note (Signed)
Pt can  Take  [employer]  now

## 2020-03-04 NOTE — Assessment & Plan Note (Signed)
No DVT

## 2020-03-04 NOTE — Assessment & Plan Note (Signed)
-   The patient's hyperlipidemia is stable on lipitor. - The patient will continue the current treatment regimen.  - I encouraged the patient to eat more vegetables and whole wheat, and to avoid fatty foods like whole milk, hard cheese, egg yolks, margarine, baked sweets, and fried foods.  - I encouraged the patient to live an active lifestyle and complete activities for 40 minutes at least three times per week.  - I instructed the patient to go to the ER if they begin having chest pain.   

## 2020-03-04 NOTE — Progress Notes (Signed)
Established Patient Office Visit  Subjective:  Patient ID: Paul Buckley., male    DOB: 01-13-1946  Age: 74 y.o. MRN: [id number]  CC:  Chief Complaint  Patient presents with  . Hypertension    HPI  Paul Buckley. presents for a follow up regarding hypertension. He has been doing okay. He still has some pain across the left side of his chest due to his shingles infection 3 months ago. He has right knee pain and is planning to see an orthopedic doctor. His reflux is "fine." He does not smoke or chew tobacco. He received both doses of the COVID-19 vaccine.  The patient's wife was recently in the ER for 3 days after a couple of falls.  Past Medical History:  Diagnosis Date  . Colon, diverticulosis   . GERD (gastroesophageal reflux disease)   . History of colonic polyps   . Internal hemorrhoids   . Pneumonia     Past Surgical History:  Procedure Laterality Date  . APPENDECTOMY  1963  . COLONOSCOPY WITH PROPOFOL N/A 04/25/2018   Procedure: COLONOSCOPY WITH PROPOFOL;  Surgeon: Lollie Sails, MD;  Location: [facility] ENDOSCOPY;  Service: Endoscopy;  Laterality: N/A;  . EYE SURGERY  2013  . HERNIA REPAIR  1986    Family History  Problem Relation Age of Onset  . Cancer Mother   . Cancer Father   . Cancer Maternal Uncle   . Cancer Paternal Uncle   . Heart attack Paternal Grandfather     Social History   Socioeconomic History  . Marital status: Married    Spouse name: Not on file  . Number of children: Not on file  . Years of education: Not on file  . Highest education level: Not on file  Occupational History  . Not on file  Tobacco Use  . Smoking status: Former Smoker    Years: 50.00    Quit date: 07/14/1967    Years since quitting: 52.6  . Smokeless tobacco: Never Used  Vaping Use  . Vaping Use: Never used  Substance and Sexual Activity  . Alcohol use: Not Currently  . Drug use: No  . Sexual activity: Not on file  Other Topics Concern  . Not on file    Social History Narrative  . Not on file   Social Determinants of Health   Financial Resource Strain:   . Difficulty of Paying Living Expenses: Not on file  Food Insecurity:   . Worried About Charity fundraiser in the Last Year: Not on file  . Ran Out of Food in the Last Year: Not on file  Transportation Needs:   . Lack of Transportation (Medical): Not on file  . Lack of Transportation (Non-Medical): Not on file  Physical Activity:   . Days of Exercise per Week: Not on file  . Minutes of Exercise per Session: Not on file  Stress:   . Feeling of Stress : Not on file  Social Connections:   . Frequency of Communication with Friends and Family: Not on file  . Frequency of Social Gatherings with Friends and Family: Not on file  . Attends Religious Services: Not on file  . Active Member of Clubs or Organizations: Not on file  . Attends Archivist Meetings: Not on file  . Marital Status: Not on file  Intimate Partner Violence:   . Fear of Current or Ex-Partner: Not on file  . Emotionally Abused: Not on file  . Physically  Abused: Not on file  . Sexually Abused: Not on file     Current Outpatient Medications:  .  aspirin 81 MG tablet, Take 81 mg by mouth once a week. , Disp: , Rfl:  .  atorvastatin (LIPITOR) 20 MG tablet, Take 20 mg by mouth daily., Disp: , Rfl: 12 .  lisinopril (PRINIVIL,ZESTRIL) 5 MG tablet, Take 5 mg by mouth daily., Disp: , Rfl:  .  losartan-hydrochlorothiazide (HYZAAR) 100-12.5 MG tablet, Take 1 tablet by mouth daily., Disp: , Rfl: 3 .  meloxicam (MOBIC) 15 MG tablet, Take 1 tablet (15 mg total) by mouth daily., Disp: 60 tablet, Rfl: 3 .  ranitidine (ZANTAC) 150 MG capsule, Take 150 mg by mouth as needed. , Disp: , Rfl:   Current Facility-Administered Medications:  .  betamethasone acetate-betamethasone sodium phosphate (CELESTONE) injection 3 mg, 3 mg, Intramuscular, Once, Evans, Brent M, DPM .  betamethasone acetate-betamethasone sodium phosphate  (CELESTONE) injection 3 mg, 3 mg, Intramuscular, Once, Evans, Brent M, DPM .  betamethasone acetate-betamethasone sodium phosphate (CELESTONE) injection 3 mg, 3 mg, Intramuscular, Once, Evans, Brent M, DPM   No Known Allergies  ROS Review of Systems  Gastrointestinal:       Reflux  Musculoskeletal: Positive for arthralgias (right knee, occasional).  Skin:       Pain on left side of chest due to shingles infections 3 months ago.  Allergic/Immunologic: Negative for environmental allergies.  All other systems reviewed and are negative.     Objective:    Physical Exam Vitals and nursing note reviewed.  Constitutional:      General: He is not in acute distress.    Appearance: He is not diaphoretic.  HENT:     Head: Normocephalic and atraumatic.  Cardiovascular:     Rate and Rhythm: Normal rate and regular rhythm.     Heart sounds: Normal heart sounds. No murmur heard.   Pulmonary:     Effort: Pulmonary effort is normal.     Breath sounds: Normal breath sounds.  Abdominal:     General: Bowel sounds are normal.     Palpations: Abdomen is soft.     Tenderness: There is no abdominal tenderness.  Musculoskeletal:        General: Tenderness (left side of chest due to recent shingles infection) present.     Cervical back: Normal range of motion and neck supple.  Skin:    Comments: Scarring on left side of chest due to recent shingles infection. Varicose veins on both legs without DVT or tenderness.  Neurological:     Mental Status: He is alert and oriented to person, place, and time.  Psychiatric:        Behavior: Behavior normal.        Thought Content: Thought content normal.        Judgment: Judgment normal.     BP 132/82   Pulse 75   Ht 6' (1.829 m)   Wt 222 lb 9.6 oz (101 kg)   BMI 30.19 kg/m  Wt Readings from Last 3 Encounters:  03/04/20 222 lb 9.6 oz (101 kg)  12/25/19 225 lb 9.6 oz (102.3 kg)  12/01/19 225 lb 4.8 oz (102.2 kg)     Health Maintenance Due    Topic Date Due  . Hepatitis C Screening  Never done  . TETANUS/TDAP  Never done  . PNA vac Low Risk Adult (1 of 2 - PCV13) Never done  . INFLUENZA VACCINE  02/11/2020    There are no  preventive care reminders to display for this patient.  Lab Results  Component Value Date   TSH 0.946 06/02/2016   Lab Results  Component Value Date   WBC 5.5 05/26/2016   HGB 14.3 05/26/2016   HCT 41.8 05/26/2016   MCV 94.3 05/26/2016   PLT 243 05/26/2016   Lab Results  Component Value Date   NA 140 06/02/2016   K 4.5 06/02/2016   CO2 29 06/02/2016   GLUCOSE 97 05/26/2016   BUN 20 05/26/2016   CREATININE 0.97 05/26/2016   AST 24 06/02/2016   ALBUMIN 3.8 06/02/2016   CALCIUM 8.9 06/02/2016   ANIONGAP 4 (L) 06/02/2016   Lab Results  Component Value Date   CHOL 108 05/26/2016   Lab Results  Component Value Date   HDL 42 05/26/2016   Lab Results  Component Value Date   LDLCALC 56 05/26/2016   Lab Results  Component Value Date   TRIG 49 05/26/2016   Lab Results  Component Value Date   CHOLHDL 2.6 05/26/2016   No results found for: HGBA1C    Assessment & Plan:   Problem List Items Addressed This Visit      Cardiovascular and Mediastinum   Varicose veins of bilateral lower extremities with other complications    No  DVT      Essential hypertension    Stop lisinopril        Other   Hyperlipidemia    - The patient's hyperlipidemia is stable on lipitor. - The patient will continue the current treatment regimen.  - I encouraged the patient to eat more vegetables and whole wheat, and to avoid fatty foods like whole milk, hard cheese, egg yolks, margarine, baked sweets, and fried foods.  - I encouraged the patient to live an active lifestyle and complete activities for 40 minutes at least three times per week.  - I instructed the patient to go to the ER if they begin having chest pain.       Herpes zoster without complication    Pt can  Take  Shingle  Shot  now          No orders of the defined types were placed in this encounter.   Follow-up: No follow-ups on file.    By signing my name below, I, [employer], attest that this documentation has been prepared under the direction and in the presence of Cletis Athens, MD. Electronically Signed: De Burrs, Medical Scribe. 03/04/20. 10:23 AM.  I personally performed the services described in this documentation, which was SCRIBED in my presence. The recorded information has been reviewed and considered accurate. It has been edited as necessary during review. Cletis Athens, MD

## 2020-03-04 NOTE — Assessment & Plan Note (Signed)
Stop lisinopril.

## 2020-04-08 ENCOUNTER — Other Ambulatory Visit: Payer: Self-pay

## 2020-04-08 ENCOUNTER — Ambulatory Visit (INDEPENDENT_AMBULATORY_CARE_PROVIDER_SITE_OTHER): Payer: Medicare Other | Admitting: Podiatry

## 2020-04-08 ENCOUNTER — Encounter: Payer: Self-pay | Admitting: Podiatry

## 2020-04-08 DIAGNOSIS — M79675 Pain in left toe(s): Secondary | ICD-10-CM | POA: Diagnosis not present

## 2020-04-08 DIAGNOSIS — B351 Tinea unguium: Secondary | ICD-10-CM

## 2020-04-08 DIAGNOSIS — M79674 Pain in right toe(s): Secondary | ICD-10-CM

## 2020-04-08 DIAGNOSIS — I872 Venous insufficiency (chronic) (peripheral): Secondary | ICD-10-CM | POA: Diagnosis not present

## 2020-04-08 NOTE — Progress Notes (Signed)
This patient returns to my office for at risk foot care.  This patient requires this care by a professional since this patient will be at risk due to having venous insufficiency.  This patient is unable to cut nails easily  himself since the patient cannot reach his nails.These nails are painful walking and wearing shoes.  This patient presents for at risk foot care today.  General Appearance  Alert, conversant and in no acute stress.  Vascular  Dorsalis pedis and posterior tibial  pulses are palpable  bilaterally.  Capillary return is within normal limits  bilaterally. Temperature is within normal limits  bilaterally.  Neurologic  Senn-Weinstein monofilament wire test within normal limits  bilaterally. Muscle power within normal limits bilaterally.  Nails Thick disfigured discolored nails with subungual debris  from hallux to fifth toes bilaterally. No evidence of bacterial infection or drainage bilaterally.  Orthopedic  No limitations of motion  feet .  No crepitus or effusions noted.  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    Skin  normotropic skin with no porokeratosis noted bilaterally.  No signs of infections or ulcers noted.     Onychomycosis  Pain in right toes  Pain in left toes  Consent was obtained for treatment procedures.   Mechanical debridement of nails 1-5  bilaterally performed with a nail nipper.  Filed with dremel without incident.    Return office visit   4 months.                  Told patient to return for periodic foot care and evaluation due to potential at risk complications.  Dr Amalia Hailey recommended he purchase his own nail nipper for nail care between office visits.   Gardiner Barefoot DPM

## 2020-04-15 ENCOUNTER — Other Ambulatory Visit: Payer: Self-pay | Admitting: *Deleted

## 2020-04-15 MED ORDER — LOSARTAN POTASSIUM 100 MG PO TABS: 100.0000 mg | ORAL_TABLET | Freq: Every day | ORAL | 3 refills | Status: DC

## 2020-04-30 ENCOUNTER — Ambulatory Visit: Payer: Medicare Other | Admitting: Internal Medicine

## 2020-05-10 ENCOUNTER — Ambulatory Visit: Payer: Medicare Other | Admitting: Family Medicine

## 2020-05-14 ENCOUNTER — Other Ambulatory Visit: Payer: Self-pay

## 2020-05-14 ENCOUNTER — Ambulatory Visit (INDEPENDENT_AMBULATORY_CARE_PROVIDER_SITE_OTHER): Payer: Medicare Other | Admitting: Internal Medicine

## 2020-05-14 DIAGNOSIS — I10 Essential (primary) hypertension: Secondary | ICD-10-CM

## 2020-05-14 DIAGNOSIS — E785 Hyperlipidemia, unspecified: Secondary | ICD-10-CM | POA: Diagnosis not present

## 2020-05-14 DIAGNOSIS — Z125 Encounter for screening for malignant neoplasm of prostate: Secondary | ICD-10-CM

## 2020-05-14 DIAGNOSIS — R972 Elevated prostate specific antigen [PSA]: Secondary | ICD-10-CM

## 2020-05-14 DIAGNOSIS — I83893 Varicose veins of bilateral lower extremities with other complications: Secondary | ICD-10-CM

## 2020-05-15 LAB — CBC WITH DIFFERENTIAL/PLATELET
Absolute Monocytes: 525 cells/uL (ref 200–950)
Basophils Absolute: 21 cells/uL (ref 0–200)
Basophils Relative: 0.3 %
Eosinophils Absolute: 238 cells/uL (ref 15–500)
Eosinophils Relative: 3.4 %
HCT: 41.1 % (ref 38.5–50.0)
Hemoglobin: 13.7 g/dL (ref 13.2–17.1)
Lymphs Abs: 1092 cells/uL (ref 850–3900)
MCH: 32.5 pg (ref 27.0–33.0)
MCHC: 33.3 g/dL (ref 32.0–36.0)
MCV: 97.6 fL (ref 80.0–100.0)
MPV: 10.1 fL (ref 7.5–12.5)
Monocytes Relative: 7.5 %
Neutro Abs: 5124 cells/uL (ref 1500–7800)
Neutrophils Relative %: 73.2 %
Platelets: 278 10*3/uL (ref 140–400)
RBC: 4.21 10*6/uL (ref 4.20–5.80)
RDW: 11.8 % (ref 11.0–15.0)
Total Lymphocyte: 15.6 %
WBC: 7 10*3/uL (ref 3.8–10.8)

## 2020-05-15 LAB — LIPID PANEL
Cholesterol: 111 mg/dL (ref <200)
HDL: 47 mg/dL (ref >=40)
LDL Cholesterol (Calc): 53 mg/dL (calc)
Non-HDL Cholesterol (Calc): 64 mg/dL (calc) (ref <130)
Total CHOL/HDL Ratio: 2.4 (calc) (ref <5.0)
Triglycerides: 38 mg/dL (ref <150)

## 2020-05-15 LAB — COMPLETE METABOLIC PANEL WITH GFR
AG Ratio: 1.7 (calc) (ref 1.0–2.5)
ALT: 11 U/L (ref 9–46)
AST: 17 U/L (ref 10–35)
Albumin: 3.9 g/dL (ref 3.6–5.1)
Alkaline phosphatase (APISO): 61 U/L (ref 35–144)
BUN: 19 mg/dL (ref 7–25)
CO2: 27 mmol/L (ref 20–32)
Calcium: 8.8 mg/dL (ref 8.6–10.3)
Chloride: 105 mmol/L (ref 98–110)
Creat: 1.03 mg/dL (ref 0.70–1.18)
GFR, Est African American: 82 mL/min/{1.73_m2} (ref >=60)
GFR, Est Non African American: 71 mL/min/{1.73_m2} (ref >=60)
Globulin: 2.3 g/dL (calc) (ref 1.9–3.7)
Glucose, Bld: 95 mg/dL (ref 65–99)
Potassium: 4.1 mmol/L (ref 3.5–5.3)
Sodium: 140 mmol/L (ref 135–146)
Total Bilirubin: 0.4 mg/dL (ref 0.2–1.2)
Total Protein: 6.2 g/dL (ref 6.1–8.1)

## 2020-05-15 LAB — TSH: TSH: 1.63 mIU/L (ref 0.40–4.50)

## 2020-05-15 LAB — PSA: PSA: 0.54 ng/mL (ref <=4.0)

## 2020-05-17 ENCOUNTER — Encounter: Payer: Medicare Other | Admitting: Family Medicine

## 2020-05-22 ENCOUNTER — Other Ambulatory Visit: Payer: Self-pay

## 2020-05-22 ENCOUNTER — Ambulatory Visit (INDEPENDENT_AMBULATORY_CARE_PROVIDER_SITE_OTHER): Payer: Medicare Other | Admitting: Family Medicine

## 2020-05-22 ENCOUNTER — Encounter: Payer: Self-pay | Admitting: Family Medicine

## 2020-05-22 VITALS — BP 153/79 | HR 80 | Ht 72.0 in | Wt 228.1 lb

## 2020-05-22 DIAGNOSIS — I10 Essential (primary) hypertension: Secondary | ICD-10-CM | POA: Diagnosis not present

## 2020-05-22 DIAGNOSIS — S60511A Abrasion of right hand, initial encounter: Secondary | ICD-10-CM | POA: Diagnosis not present

## 2020-05-22 DIAGNOSIS — Z0000 Encounter for general adult medical examination without abnormal findings: Secondary | ICD-10-CM | POA: Diagnosis not present

## 2020-05-22 DIAGNOSIS — Z23 Encounter for immunization: Secondary | ICD-10-CM

## 2020-05-22 NOTE — Assessment & Plan Note (Signed)
Colon- Had colonoscopy 2019 due in 2024. TDAp- Has not had in over 10 years he says, will give today due to multiple recent abrasions.  Eye exam- 2021 PSA- 2021 WNL

## 2020-05-22 NOTE — Assessment & Plan Note (Signed)
BP not quite at goal today, says that it has been wnl but he was rushing today, will f/u in 3 months.

## 2020-05-22 NOTE — Progress Notes (Signed)
Established Patient Office Visit  SUBJECTIVE:  Subjective  Patient ID: Paul Simkins., male    DOB: 1946/01/23  Age: 74 y.o. MRN: [id number]  CC:  Chief Complaint  Patient presents with  . Annual Exam    HPI Paul Quintela. is a 74 y.o. male presenting today for Annual PE, no acute problems today.   Past Medical History:  Diagnosis Date  . Colon, diverticulosis   . GERD (gastroesophageal reflux disease)   . History of colonic polyps   . Internal hemorrhoids   . Pneumonia     Past Surgical History:  Procedure Laterality Date  . APPENDECTOMY  1963  . COLONOSCOPY WITH PROPOFOL N/A 04/25/2018   Procedure: COLONOSCOPY WITH PROPOFOL;  Surgeon: Lollie Sails, MD;  Location: [facility] ENDOSCOPY;  Service: Endoscopy;  Laterality: N/A;  . EYE SURGERY  2013  . HERNIA REPAIR  1986    Family History  Problem Relation Age of Onset  . Cancer Mother   . Cancer Father   . Cancer Maternal Uncle   . Cancer Paternal Uncle   . Heart attack Paternal Grandfather     Social History   Socioeconomic History  . Marital status: Married    Spouse name: Not on file  . Number of children: Not on file  . Years of education: Not on file  . Highest education level: Not on file  Occupational History  . Not on file  Tobacco Use  . Smoking status: Former Smoker    Years: 50.00    Quit date: 07/14/1967    Years since quitting: 52.8  . Smokeless tobacco: Never Used  Vaping Use  . Vaping Use: Never used  Substance and Sexual Activity  . Alcohol use: Not Currently  . Drug use: No  . Sexual activity: Not on file  Other Topics Concern  . Not on file  Social History Narrative  . Not on file   Social Determinants of Health   Financial Resource Strain:   . Difficulty of Paying Living Expenses: Not on file  Food Insecurity:   . Worried About Charity fundraiser in the Last Year: Not on file  . Ran Out of Food in the Last Year: Not on file  Transportation Needs:   . Lack of  Transportation (Medical): Not on file  . Lack of Transportation (Non-Medical): Not on file  Physical Activity:   . Days of Exercise per Week: Not on file  . Minutes of Exercise per Session: Not on file  Stress:   . Feeling of Stress : Not on file  Social Connections:   . Frequency of Communication with Friends and Family: Not on file  . Frequency of Social Gatherings with Friends and Family: Not on file  . Attends Religious Services: Not on file  . Active Member of Clubs or Organizations: Not on file  . Attends Archivist Meetings: Not on file  . Marital Status: Not on file  Intimate Partner Violence:   . Fear of Current or Ex-Partner: Not on file  . Emotionally Abused: Not on file  . Physically Abused: Not on file  . Sexually Abused: Not on file     Current Outpatient Medications:  .  aspirin 81 MG tablet, Take 81 mg by mouth once a week. , Disp: , Rfl:  .  atorvastatin (LIPITOR) 20 MG tablet, Take 20 mg by mouth daily., Disp: , Rfl: 12 .  lisinopril (PRINIVIL,ZESTRIL) 5 MG tablet, Take 5 mg  by mouth daily., Disp: , Rfl:  .  ranitidine (ZANTAC) 150 MG capsule, Take 150 mg by mouth as needed. , Disp: , Rfl:   Current Facility-Administered Medications:  .  betamethasone acetate-betamethasone sodium phosphate (CELESTONE) injection 3 mg, 3 mg, Intramuscular, Once, Evans, Brent M, DPM .  betamethasone acetate-betamethasone sodium phosphate (CELESTONE) injection 3 mg, 3 mg, Intramuscular, Once, Evans, Brent M, DPM .  betamethasone acetate-betamethasone sodium phosphate (CELESTONE) injection 3 mg, 3 mg, Intramuscular, Once, Evans, Brent M, DPM   No Known Allergies  ROS Review of Systems  Constitutional: Negative.   HENT: Negative.   Respiratory: Negative.   Cardiovascular: Negative.   Musculoskeletal: Negative.   Skin: Negative.   Neurological: Negative.   Psychiatric/Behavioral: Negative.   All other systems reviewed and are negative.    OBJECTIVE:    Physical  Exam Vitals and nursing note reviewed.  Constitutional:      Appearance: Normal appearance.  HENT:     Mouth/Throat:     Mouth: Mucous membranes are moist.  Eyes:     Pupils: Pupils are equal, round, and reactive to light.  Cardiovascular:     Rate and Rhythm: Normal rate.     Pulses: Normal pulses.  Pulmonary:     Effort: Pulmonary effort is normal.  Musculoskeletal:     Cervical back: Normal range of motion.  Skin:    General: Skin is warm.  Neurological:     Mental Status: He is alert.  Psychiatric:        Mood and Affect: Mood normal.     BP (!) 153/79   Pulse 80   Ht 6' (1.829 m)   Wt 228 lb 1.6 oz (103.5 kg)   BMI 30.94 kg/m  Wt Readings from Last 3 Encounters:  05/22/20 228 lb 1.6 oz (103.5 kg)  03/04/20 222 lb 9.6 oz (101 kg)  12/25/19 225 lb 9.6 oz (102.3 kg)    Health Maintenance Due  Topic Date Due  . Hepatitis C Screening  Never done  . PNA vac Low Risk Adult (1 of 2 - PCV13) Never done    There are no preventive care reminders to display for this patient.  CBC Latest Ref Rng & Units 05/14/2020 05/26/2016 07/04/2014  WBC 3.8 - 10.8 Thousand/uL 7.0 5.5 6.8  Hemoglobin 13.2 - 17.1 g/dL 13.7 14.3 12.7(L)  Hematocrit 38 - 50 % 41.1 41.8 38.2(L)  Platelets 140 - 400 Thousand/uL 278 243 247   CMP Latest Ref Rng & Units 05/14/2020 06/02/2016 05/26/2016  Glucose 65 - 99 mg/dL 95 - 97  BUN 7 - 25 mg/dL 19 - 20  Creatinine 0.70 - 1.18 mg/dL 1.03 - 0.97  Sodium 135 - 146 mmol/L 140 140 138  Potassium 3.5 - 5.3 mmol/L 4.1 4.5 4.2  Chloride 98 - 110 mmol/L 105 107 105  CO2 20 - 32 mmol/L 27 29 29   Calcium 8.6 - 10.3 mg/dL 8.8 8.9 8.4(L)  Total Protein 6.1 - 8.1 g/dL 6.2 - -  Total Bilirubin 0.2 - 1.2 mg/dL 0.4 - -  AST 10 - 35 U/L 17 24 -  ALT 9 - 46 U/L 11 - -    Lab Results  Component Value Date   TSH 1.63 05/14/2020   Lab Results  Component Value Date   ALBUMIN 3.8 06/02/2016   ANIONGAP 4 (L) 06/02/2016   Lab Results  Component Value Date    CHOL 111 05/14/2020   CHOL 108 05/26/2016   HDL 47 05/14/2020  HDL 42 05/26/2016   LDLCALC 53 05/14/2020   LDLCALC 56 05/26/2016   CHOLHDL 2.4 05/14/2020   CHOLHDL 2.6 05/26/2016   Lab Results  Component Value Date   TRIG 38 05/14/2020   No results found for: HGBA1C    ASSESSMENT & PLAN:   Problem List Items Addressed This Visit      Cardiovascular and Mediastinum   Essential hypertension    BP not quite at goal today, says that it has been wnl but he was rushing today, will f/u in 3 months.         Musculoskeletal and Integument   Abrasion of right hand    Multiple abrasions reported recently, no sign of infection, will update TDAP today.         Other   Annual physical exam - Primary    Colon- Had colonoscopy 2019 due in 2024. TDAp- Has not had in over 10 years he says, will give today due to multiple recent abrasions.  Eye exam- 2021 PSA- 2021 WNL      Relevant Orders   EKG 12-Lead    Other Visit Diagnoses    Need for Tdap vaccination       Relevant Orders   Tdap vaccine greater than or equal to 7yo IM (Completed)      No orders of the defined types were placed in this encounter.     Follow-up: No follow-ups on file.    Beckie Salts, [place] [address] [place], [place] [address]

## 2020-05-22 NOTE — Assessment & Plan Note (Signed)
Multiple abrasions reported recently, no sign of infection, will update TDAP today.

## 2020-06-17 ENCOUNTER — Ambulatory Visit: Payer: Medicare Other | Attending: Internal Medicine

## 2020-06-17 DIAGNOSIS — Z23 Encounter for immunization: Secondary | ICD-10-CM

## 2020-06-17 NOTE — Progress Notes (Signed)
   [id number]  Name:  Paul Buckley.    MRN: [id number] DOB: June 25, 1946  06/17/2020  [name] was observed post Covid-19 immunization for 15 minutes without incident. He was provided with Vaccine Information Sheet and instruction to access the V-Safe system.   [name] was instructed to call 911 with any severe reactions post vaccine: [name] Difficulty breathing  . Swelling of face and throat  . A fast heartbeat  . A bad rash all over body  . Dizziness and weakness   Immunizations Administered    No immunizations on file.

## 2020-07-15 DIAGNOSIS — M9903 Segmental and somatic dysfunction of lumbar region: Secondary | ICD-10-CM | POA: Diagnosis not present

## 2020-07-15 DIAGNOSIS — M9905 Segmental and somatic dysfunction of pelvic region: Secondary | ICD-10-CM | POA: Diagnosis not present

## 2020-07-15 DIAGNOSIS — M5136 Other intervertebral disc degeneration, lumbar region: Secondary | ICD-10-CM | POA: Diagnosis not present

## 2020-07-15 DIAGNOSIS — M955 Acquired deformity of pelvis: Secondary | ICD-10-CM | POA: Diagnosis not present

## 2020-07-18 DIAGNOSIS — M9905 Segmental and somatic dysfunction of pelvic region: Secondary | ICD-10-CM | POA: Diagnosis not present

## 2020-07-18 DIAGNOSIS — M9903 Segmental and somatic dysfunction of lumbar region: Secondary | ICD-10-CM | POA: Diagnosis not present

## 2020-07-18 DIAGNOSIS — M5136 Other intervertebral disc degeneration, lumbar region: Secondary | ICD-10-CM | POA: Diagnosis not present

## 2020-07-18 DIAGNOSIS — M955 Acquired deformity of pelvis: Secondary | ICD-10-CM | POA: Diagnosis not present

## 2020-07-22 DIAGNOSIS — M955 Acquired deformity of pelvis: Secondary | ICD-10-CM | POA: Diagnosis not present

## 2020-07-22 DIAGNOSIS — M5136 Other intervertebral disc degeneration, lumbar region: Secondary | ICD-10-CM | POA: Diagnosis not present

## 2020-07-22 DIAGNOSIS — M9905 Segmental and somatic dysfunction of pelvic region: Secondary | ICD-10-CM | POA: Diagnosis not present

## 2020-07-22 DIAGNOSIS — M9903 Segmental and somatic dysfunction of lumbar region: Secondary | ICD-10-CM | POA: Diagnosis not present

## 2020-07-26 DIAGNOSIS — M9905 Segmental and somatic dysfunction of pelvic region: Secondary | ICD-10-CM | POA: Diagnosis not present

## 2020-07-26 DIAGNOSIS — M955 Acquired deformity of pelvis: Secondary | ICD-10-CM | POA: Diagnosis not present

## 2020-07-26 DIAGNOSIS — M5136 Other intervertebral disc degeneration, lumbar region: Secondary | ICD-10-CM | POA: Diagnosis not present

## 2020-07-26 DIAGNOSIS — M9903 Segmental and somatic dysfunction of lumbar region: Secondary | ICD-10-CM | POA: Diagnosis not present

## 2020-07-31 ENCOUNTER — Encounter: Payer: Self-pay | Admitting: Internal Medicine

## 2020-07-31 ENCOUNTER — Other Ambulatory Visit: Payer: Self-pay

## 2020-07-31 ENCOUNTER — Ambulatory Visit: Payer: Medicare Other | Admitting: Internal Medicine

## 2020-07-31 VITALS — BP 138/78 | HR 78 | Ht 72.0 in | Wt 225.8 lb

## 2020-07-31 DIAGNOSIS — M955 Acquired deformity of pelvis: Secondary | ICD-10-CM | POA: Diagnosis not present

## 2020-07-31 DIAGNOSIS — M9903 Segmental and somatic dysfunction of lumbar region: Secondary | ICD-10-CM | POA: Diagnosis not present

## 2020-07-31 DIAGNOSIS — M9905 Segmental and somatic dysfunction of pelvic region: Secondary | ICD-10-CM | POA: Diagnosis not present

## 2020-07-31 DIAGNOSIS — M5136 Other intervertebral disc degeneration, lumbar region: Secondary | ICD-10-CM | POA: Diagnosis not present

## 2020-07-31 NOTE — Progress Notes (Signed)
Established Patient Office Visit  Subjective:  Patient ID: Paul Cina., male    DOB: 1945-08-12  Age: 75 y.o. MRN: [id number]  CC:  Chief Complaint  Patient presents with  . Advice Only    Patient here for advised for his wife's care.     HPI  Paul Buckley. presents for report of his wife situation.  Patient wife in the hospice care has shifted to palliative care.  He requires 24-hour care.  He has been taken to the emergency room several times.  Also  Has been seen by a neurologist.  Patient has been was advised that he should get a part-time help to take care of his wife.  He also understands that his wife condition is terminal..  Past Medical History:  Diagnosis Date  . Colon, diverticulosis   . GERD (gastroesophageal reflux disease)   . History of colonic polyps   . Internal hemorrhoids   . Pneumonia     Past Surgical History:  Procedure Laterality Date  . APPENDECTOMY  1963  . COLONOSCOPY WITH PROPOFOL N/A 04/25/2018   Procedure: COLONOSCOPY WITH PROPOFOL;  Surgeon: Lollie Sails, MD;  Location: [facility] ENDOSCOPY;  Service: Endoscopy;  Laterality: N/A;  . EYE SURGERY  2013  . HERNIA REPAIR  1986    Family History  Problem Relation Age of Onset  . Cancer Mother   . Cancer Father   . Cancer Maternal Uncle   . Cancer Paternal Uncle   . Heart attack Paternal Grandfather     Social History   Socioeconomic History  . Marital status: Married    Spouse name: Not on file  . Number of children: Not on file  . Years of education: Not on file  . Highest education level: Not on file  Occupational History  . Not on file  Tobacco Use  . Smoking status: Former Smoker    Years: 50.00    Quit date: 07/14/1967    Years since quitting: 53.0  . Smokeless tobacco: Never Used  Vaping Use  . Vaping Use: Never used  Substance and Sexual Activity  . Alcohol use: Not Currently  . Drug use: No  . Sexual activity: Not on file  Other Topics Concern  . Not on  file  Social History Narrative  . Not on file   Social Determinants of Health   Financial Resource Strain: Not on file  Food Insecurity: Not on file  Transportation Needs: Not on file  Physical Activity: Not on file  Stress: Not on file  Social Connections: Not on file  Intimate Partner Violence: Not on file     Current Outpatient Medications:  .  aspirin 81 MG tablet, Take 81 mg by mouth once a week. , Disp: , Rfl:  .  atorvastatin (LIPITOR) 20 MG tablet, Take 20 mg by mouth daily., Disp: , Rfl: 12 .  lisinopril (PRINIVIL,ZESTRIL) 5 MG tablet, Take 5 mg by mouth daily., Disp: , Rfl:  .  ranitidine (ZANTAC) 150 MG capsule, Take 150 mg by mouth as needed. , Disp: , Rfl:   Current Facility-Administered Medications:  .  betamethasone acetate-betamethasone sodium phosphate (CELESTONE) injection 3 mg, 3 mg, Intramuscular, Once, Evans, Brent M, DPM .  betamethasone acetate-betamethasone sodium phosphate (CELESTONE) injection 3 mg, 3 mg, Intramuscular, Once, Evans, Brent M, DPM .  betamethasone acetate-betamethasone sodium phosphate (CELESTONE) injection 3 mg, 3 mg, Intramuscular, Once, Evans, Brent M, DPM   No Known Allergies  ROS Review of  Systems  Psychiatric/Behavioral: Positive for agitation, behavioral problems, confusion, decreased concentration and dysphoric mood. The patient is hyperactive.       Objective:    Physical Exam physical exam was not performed  BP 138/78   Pulse 78   Ht 6' (1.829 m)   Wt 225 lb 12.8 oz (102.4 kg)   BMI 30.62 kg/m  Wt Readings from Last 3 Encounters:  07/31/20 225 lb 12.8 oz (102.4 kg)  05/22/20 228 lb 1.6 oz (103.5 kg)  03/04/20 222 lb 9.6 oz (101 kg)     Health Maintenance Due  Topic Date Due  . Hepatitis C Screening  Never done  . PNA vac Low Risk Adult (1 of 2 - PCV13) Never done    There are no preventive care reminders to display for this patient.  Lab Results  Component Value Date   TSH 1.63 05/14/2020   Lab Results   Component Value Date   WBC 7.0 05/14/2020   HGB 13.7 05/14/2020   HCT 41.1 05/14/2020   MCV 97.6 05/14/2020   PLT 278 05/14/2020   Lab Results  Component Value Date   NA 140 05/14/2020   K 4.1 05/14/2020   CO2 27 05/14/2020   GLUCOSE 95 05/14/2020   BUN 19 05/14/2020   CREATININE 1.03 05/14/2020   BILITOT 0.4 05/14/2020   AST 17 05/14/2020   ALT 11 05/14/2020   PROT 6.2 05/14/2020   ALBUMIN 3.8 06/02/2016   CALCIUM 8.8 05/14/2020   ANIONGAP 4 (L) 06/02/2016   Lab Results  Component Value Date   CHOL 111 05/14/2020   Lab Results  Component Value Date   HDL 47 05/14/2020   Lab Results  Component Value Date   LDLCALC 53 05/14/2020   Lab Results  Component Value Date   TRIG 38 05/14/2020   Lab Results  Component Value Date   CHOLHDL 2.4 05/14/2020   No results found for: HGBA1C    Assessment & Plan:   Problem List Items Addressed This Visit   None     No orders of the defined types were placed in this encounter.   Follow-up: No follow-ups on file.    Paul Athens, MD

## 2020-08-05 DIAGNOSIS — M955 Acquired deformity of pelvis: Secondary | ICD-10-CM | POA: Diagnosis not present

## 2020-08-05 DIAGNOSIS — M9903 Segmental and somatic dysfunction of lumbar region: Secondary | ICD-10-CM | POA: Diagnosis not present

## 2020-08-05 DIAGNOSIS — M9905 Segmental and somatic dysfunction of pelvic region: Secondary | ICD-10-CM | POA: Diagnosis not present

## 2020-08-05 DIAGNOSIS — M5136 Other intervertebral disc degeneration, lumbar region: Secondary | ICD-10-CM | POA: Diagnosis not present

## 2020-08-08 ENCOUNTER — Ambulatory Visit: Payer: Medicare Other | Admitting: Podiatry

## 2020-08-12 ENCOUNTER — Encounter: Payer: Self-pay | Admitting: Podiatry

## 2020-08-12 ENCOUNTER — Other Ambulatory Visit: Payer: Self-pay

## 2020-08-12 ENCOUNTER — Ambulatory Visit (INDEPENDENT_AMBULATORY_CARE_PROVIDER_SITE_OTHER): Payer: Medicare Other | Admitting: Podiatry

## 2020-08-12 DIAGNOSIS — M79675 Pain in left toe(s): Secondary | ICD-10-CM | POA: Diagnosis not present

## 2020-08-12 DIAGNOSIS — M79674 Pain in right toe(s): Secondary | ICD-10-CM

## 2020-08-12 DIAGNOSIS — B351 Tinea unguium: Secondary | ICD-10-CM | POA: Diagnosis not present

## 2020-08-12 DIAGNOSIS — I872 Venous insufficiency (chronic) (peripheral): Secondary | ICD-10-CM | POA: Diagnosis not present

## 2020-08-12 NOTE — Progress Notes (Signed)
This patient returns to my office for at risk foot care.  This patient requires this care by a professional since this patient will be at risk due to having venous insufficiency.  This patient is unable to cut nails easily  himself since the patient cannot reach his nails.These nails are painful walking and wearing shoes.  This patient presents for at risk foot care today.  General Appearance  Alert, conversant and in no acute stress.  Vascular  Dorsalis pedis and posterior tibial  pulses are palpable  bilaterally.  Capillary return is within normal limits  bilaterally. Temperature is within normal limits  bilaterally.  Neurologic  Senn-Weinstein monofilament wire test within normal limits  bilaterally. Muscle power within normal limits bilaterally.  Nails Thick disfigured discolored nails with subungual debris  from hallux to fifth toes bilaterally. No evidence of bacterial infection or drainage bilaterally.  Orthopedic  No limitations of motion  feet .  No crepitus or effusions noted.  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    Skin  normotropic skin with no porokeratosis noted bilaterally.  No signs of infections or ulcers noted.     Onychomycosis  Pain in right toes  Pain in left toes  Consent was obtained for treatment procedures.   Mechanical debridement of nails 1-5  bilaterally performed with a nail nipper.  Filed with dremel without incident.    Return office visit   4 months.                  Told patient to return for periodic foot care and evaluation due to potential at risk complications.     Gardiner Barefoot DPM

## 2020-08-13 DIAGNOSIS — M25661 Stiffness of right knee, not elsewhere classified: Secondary | ICD-10-CM | POA: Diagnosis not present

## 2020-08-13 DIAGNOSIS — M25561 Pain in right knee: Secondary | ICD-10-CM | POA: Diagnosis not present

## 2020-08-14 DIAGNOSIS — M9905 Segmental and somatic dysfunction of pelvic region: Secondary | ICD-10-CM | POA: Diagnosis not present

## 2020-08-14 DIAGNOSIS — M5136 Other intervertebral disc degeneration, lumbar region: Secondary | ICD-10-CM | POA: Diagnosis not present

## 2020-08-14 DIAGNOSIS — M9903 Segmental and somatic dysfunction of lumbar region: Secondary | ICD-10-CM | POA: Diagnosis not present

## 2020-08-14 DIAGNOSIS — M955 Acquired deformity of pelvis: Secondary | ICD-10-CM | POA: Diagnosis not present

## 2020-08-20 DIAGNOSIS — M9905 Segmental and somatic dysfunction of pelvic region: Secondary | ICD-10-CM | POA: Diagnosis not present

## 2020-08-20 DIAGNOSIS — M5136 Other intervertebral disc degeneration, lumbar region: Secondary | ICD-10-CM | POA: Diagnosis not present

## 2020-08-20 DIAGNOSIS — M955 Acquired deformity of pelvis: Secondary | ICD-10-CM | POA: Diagnosis not present

## 2020-08-20 DIAGNOSIS — M9903 Segmental and somatic dysfunction of lumbar region: Secondary | ICD-10-CM | POA: Diagnosis not present

## 2020-08-21 ENCOUNTER — Ambulatory Visit: Payer: Medicare Other | Admitting: Internal Medicine

## 2020-08-23 DIAGNOSIS — M9905 Segmental and somatic dysfunction of pelvic region: Secondary | ICD-10-CM | POA: Diagnosis not present

## 2020-08-23 DIAGNOSIS — M5136 Other intervertebral disc degeneration, lumbar region: Secondary | ICD-10-CM | POA: Diagnosis not present

## 2020-08-23 DIAGNOSIS — M9903 Segmental and somatic dysfunction of lumbar region: Secondary | ICD-10-CM | POA: Diagnosis not present

## 2020-08-23 DIAGNOSIS — M955 Acquired deformity of pelvis: Secondary | ICD-10-CM | POA: Diagnosis not present

## 2020-08-26 ENCOUNTER — Ambulatory Visit (INDEPENDENT_AMBULATORY_CARE_PROVIDER_SITE_OTHER): Payer: Medicare Other | Admitting: Internal Medicine

## 2020-08-26 ENCOUNTER — Other Ambulatory Visit: Payer: Self-pay

## 2020-08-26 ENCOUNTER — Encounter: Payer: Self-pay | Admitting: Internal Medicine

## 2020-08-26 VITALS — BP 146/81 | HR 75 | Ht 72.0 in | Wt 226.5 lb

## 2020-08-26 DIAGNOSIS — R972 Elevated prostate specific antigen [PSA]: Secondary | ICD-10-CM | POA: Diagnosis not present

## 2020-08-26 DIAGNOSIS — I872 Venous insufficiency (chronic) (peripheral): Secondary | ICD-10-CM | POA: Diagnosis not present

## 2020-08-26 DIAGNOSIS — J301 Allergic rhinitis due to pollen: Secondary | ICD-10-CM

## 2020-08-26 DIAGNOSIS — I10 Essential (primary) hypertension: Secondary | ICD-10-CM | POA: Diagnosis not present

## 2020-08-26 NOTE — Progress Notes (Signed)
Established Patient Office Visit  Subjective:  Patient ID: Paul Flaum., male    DOB: 11/25/1945  Age: 75 y.o. MRN: [id number]  CC:  Chief Complaint  Patient presents with  . Hypertension    HPI  Paul Buckley. presents for follow-up of blood pressure checkup.  He has been taking losartan regularly.  He denies any chest pain or shortness of breath.  He has a lot of anxiety because of his wife's sickness  Past Medical History:  Diagnosis Date  . Colon, diverticulosis   . GERD (gastroesophageal reflux disease)   . History of colonic polyps   . Internal hemorrhoids   . Pneumonia     Past Surgical History:  Procedure Laterality Date  . APPENDECTOMY  1963  . COLONOSCOPY WITH PROPOFOL N/A 04/25/2018   Procedure: COLONOSCOPY WITH PROPOFOL;  Surgeon: Lollie Sails, MD;  Location: [facility] ENDOSCOPY;  Service: Endoscopy;  Laterality: N/A;  . EYE SURGERY  2013  . HERNIA REPAIR  1986    Family History  Problem Relation Age of Onset  . Cancer Mother   . Cancer Father   . Cancer Maternal Uncle   . Cancer Paternal Uncle   . Heart attack Paternal Grandfather     Social History   Socioeconomic History  . Marital status: Married    Spouse name: Not on file  . Number of children: Not on file  . Years of education: Not on file  . Highest education level: Not on file  Occupational History  . Not on file  Tobacco Use  . Smoking status: Former Smoker    Years: 50.00    Quit date: 07/14/1967    Years since quitting: 53.1  . Smokeless tobacco: Never Used  Vaping Use  . Vaping Use: Never used  Substance and Sexual Activity  . Alcohol use: Not Currently  . Drug use: No  . Sexual activity: Not on file  Other Topics Concern  . Not on file  Social History Narrative  . Not on file   Social Determinants of Health   Financial Resource Strain: Not on file  Food Insecurity: Not on file  Transportation Needs: Not on file  Physical Activity: Not on file  Stress: Not on  file  Social Connections: Not on file  Intimate Partner Violence: Not on file     Current Outpatient Medications:  .  aspirin 81 MG tablet, Take 81 mg by mouth once a week. , Disp: , Rfl:  .  atorvastatin (LIPITOR) 20 MG tablet, Take 20 mg by mouth daily., Disp: , Rfl: 12 .  lisinopril (PRINIVIL,ZESTRIL) 5 MG tablet, Take 5 mg by mouth daily., Disp: , Rfl:  .  ranitidine (ZANTAC) 150 MG capsule, Take 150 mg by mouth as needed. , Disp: , Rfl:   Current Facility-Administered Medications:  .  betamethasone acetate-betamethasone sodium phosphate (CELESTONE) injection 3 mg, 3 mg, Intramuscular, Once, Evans, Brent M, DPM .  betamethasone acetate-betamethasone sodium phosphate (CELESTONE) injection 3 mg, 3 mg, Intramuscular, Once, Evans, Brent M, DPM .  betamethasone acetate-betamethasone sodium phosphate (CELESTONE) injection 3 mg, 3 mg, Intramuscular, Once, Evans, Brent M, DPM   No Known Allergies  ROS Review of Systems  Constitutional: Negative.   HENT: Negative.   Eyes: Negative.   Respiratory: Negative.   Cardiovascular: Negative.   Gastrointestinal: Negative.   Endocrine: Negative.   Genitourinary: Negative.   Musculoskeletal: Negative.   Skin: Negative.   Allergic/Immunologic: Negative.   Neurological: Negative.  Hematological: Negative.   Psychiatric/Behavioral: Negative.   All other systems reviewed and are negative.     Objective:    Physical Exam Vitals reviewed.  Constitutional:      Appearance: Normal appearance.  HENT:     Mouth/Throat:     Mouth: Mucous membranes are moist.  Eyes:     Pupils: Pupils are equal, round, and reactive to light.  Neck:     Vascular: No carotid bruit.  Cardiovascular:     Rate and Rhythm: Normal rate and regular rhythm.     Pulses: Normal pulses.     Heart sounds: Normal heart sounds.  Pulmonary:     Effort: Pulmonary effort is normal.     Breath sounds: Normal breath sounds.  Abdominal:     General: Bowel sounds are  normal.     Palpations: Abdomen is soft. There is no hepatomegaly, splenomegaly or mass.     Tenderness: There is no abdominal tenderness.     Hernia: No hernia is present.  Musculoskeletal:     Cervical back: Neck supple.     Right lower leg: No edema.     Left lower leg: No edema.  Skin:    Findings: No rash.  Neurological:     Mental Status: He is alert and oriented to person, place, and time.     Motor: No weakness.  Psychiatric:        Mood and Affect: Mood normal.        Behavior: Behavior normal.     BP (!) 146/81   Pulse 75   Ht 6' (1.829 m)   Wt 226 lb 8 oz (102.7 kg)   BMI 30.72 kg/m  Wt Readings from Last 3 Encounters:  08/26/20 226 lb 8 oz (102.7 kg)  07/31/20 225 lb 12.8 oz (102.4 kg)  05/22/20 228 lb 1.6 oz (103.5 kg)     Health Maintenance Due  Topic Date Due  . Hepatitis C Screening  Never done  . PNA vac Low Risk Adult (1 of 2 - PCV13) Never done    There are no preventive care reminders to display for this patient.  Lab Results  Component Value Date   TSH 1.63 05/14/2020   Lab Results  Component Value Date   WBC 7.0 05/14/2020   HGB 13.7 05/14/2020   HCT 41.1 05/14/2020   MCV 97.6 05/14/2020   PLT 278 05/14/2020   Lab Results  Component Value Date   NA 140 05/14/2020   K 4.1 05/14/2020   CO2 27 05/14/2020   GLUCOSE 95 05/14/2020   BUN 19 05/14/2020   CREATININE 1.03 05/14/2020   BILITOT 0.4 05/14/2020   AST 17 05/14/2020   ALT 11 05/14/2020   PROT 6.2 05/14/2020   ALBUMIN 3.8 06/02/2016   CALCIUM 8.8 05/14/2020   ANIONGAP 4 (L) 06/02/2016   Lab Results  Component Value Date   CHOL 111 05/14/2020   Lab Results  Component Value Date   HDL 47 05/14/2020   Lab Results  Component Value Date   LDLCALC 53 05/14/2020   Lab Results  Component Value Date   TRIG 38 05/14/2020   Lab Results  Component Value Date   CHOLHDL 2.4 05/14/2020   No results found for: HGBA1C    Assessment & Plan:   Problem List Items Addressed  This Visit      Cardiovascular and Mediastinum   Essential hypertension    Blood pressure is stable on the present medication.  Chronic venous insufficiency - Primary    Patient was advised to walk on a daily basis and his use stockings        Respiratory   Seasonal allergic rhinitis due to pollen    Patient was advised to take Claritin 5 mg p.o. daily.        Other   Elevated PSA    Suggest to have follow-up with urologist.         No orders of the defined types were placed in this encounter.   Follow-up: No follow-ups on file.  Patient blood pressure stable he will come back for follow-up in about 6 weeks.  His lab tests are normal.  He was advised to walk on a daily basis.   Cletis Athens, MD

## 2020-08-27 DIAGNOSIS — M955 Acquired deformity of pelvis: Secondary | ICD-10-CM | POA: Diagnosis not present

## 2020-08-27 DIAGNOSIS — M9903 Segmental and somatic dysfunction of lumbar region: Secondary | ICD-10-CM | POA: Diagnosis not present

## 2020-08-27 DIAGNOSIS — M9905 Segmental and somatic dysfunction of pelvic region: Secondary | ICD-10-CM | POA: Diagnosis not present

## 2020-08-27 DIAGNOSIS — M5136 Other intervertebral disc degeneration, lumbar region: Secondary | ICD-10-CM | POA: Diagnosis not present

## 2020-08-29 ENCOUNTER — Encounter: Payer: Self-pay | Admitting: Internal Medicine

## 2020-08-29 DIAGNOSIS — R972 Elevated prostate specific antigen [PSA]: Secondary | ICD-10-CM | POA: Insufficient documentation

## 2020-08-29 NOTE — Assessment & Plan Note (Signed)
Suggest to have follow-up with urologist.

## 2020-08-29 NOTE — Assessment & Plan Note (Signed)
Blood pressure is stable on the present medication 

## 2020-08-29 NOTE — Assessment & Plan Note (Signed)
Patient was advised to take Claritin 5 mg p.o. daily ?

## 2020-08-29 NOTE — Assessment & Plan Note (Signed)
Patient was advised to walk on a daily basis and his use stockings

## 2020-08-30 DIAGNOSIS — M955 Acquired deformity of pelvis: Secondary | ICD-10-CM | POA: Diagnosis not present

## 2020-08-30 DIAGNOSIS — M5136 Other intervertebral disc degeneration, lumbar region: Secondary | ICD-10-CM | POA: Diagnosis not present

## 2020-08-30 DIAGNOSIS — M9905 Segmental and somatic dysfunction of pelvic region: Secondary | ICD-10-CM | POA: Diagnosis not present

## 2020-08-30 DIAGNOSIS — M9903 Segmental and somatic dysfunction of lumbar region: Secondary | ICD-10-CM | POA: Diagnosis not present

## 2020-09-06 DIAGNOSIS — M5136 Other intervertebral disc degeneration, lumbar region: Secondary | ICD-10-CM | POA: Diagnosis not present

## 2020-09-06 DIAGNOSIS — M9905 Segmental and somatic dysfunction of pelvic region: Secondary | ICD-10-CM | POA: Diagnosis not present

## 2020-09-06 DIAGNOSIS — M955 Acquired deformity of pelvis: Secondary | ICD-10-CM | POA: Diagnosis not present

## 2020-09-06 DIAGNOSIS — M9903 Segmental and somatic dysfunction of lumbar region: Secondary | ICD-10-CM | POA: Diagnosis not present

## 2020-09-20 DIAGNOSIS — M9903 Segmental and somatic dysfunction of lumbar region: Secondary | ICD-10-CM | POA: Diagnosis not present

## 2020-09-20 DIAGNOSIS — M9905 Segmental and somatic dysfunction of pelvic region: Secondary | ICD-10-CM | POA: Diagnosis not present

## 2020-09-20 DIAGNOSIS — M955 Acquired deformity of pelvis: Secondary | ICD-10-CM | POA: Diagnosis not present

## 2020-09-20 DIAGNOSIS — M5136 Other intervertebral disc degeneration, lumbar region: Secondary | ICD-10-CM | POA: Diagnosis not present

## 2020-09-24 DIAGNOSIS — S83281A Other tear of lateral meniscus, current injury, right knee, initial encounter: Secondary | ICD-10-CM | POA: Diagnosis not present

## 2020-09-24 DIAGNOSIS — S83241A Other tear of medial meniscus, current injury, right knee, initial encounter: Secondary | ICD-10-CM | POA: Diagnosis not present

## 2020-10-04 DIAGNOSIS — M955 Acquired deformity of pelvis: Secondary | ICD-10-CM | POA: Diagnosis not present

## 2020-10-04 DIAGNOSIS — M9905 Segmental and somatic dysfunction of pelvic region: Secondary | ICD-10-CM | POA: Diagnosis not present

## 2020-10-04 DIAGNOSIS — M5136 Other intervertebral disc degeneration, lumbar region: Secondary | ICD-10-CM | POA: Diagnosis not present

## 2020-10-04 DIAGNOSIS — M9903 Segmental and somatic dysfunction of lumbar region: Secondary | ICD-10-CM | POA: Diagnosis not present

## 2020-10-07 ENCOUNTER — Encounter: Payer: Self-pay | Admitting: Internal Medicine

## 2020-10-07 ENCOUNTER — Other Ambulatory Visit: Payer: Self-pay

## 2020-10-07 ENCOUNTER — Ambulatory Visit (INDEPENDENT_AMBULATORY_CARE_PROVIDER_SITE_OTHER): Payer: Medicare Other | Admitting: Internal Medicine

## 2020-10-07 VITALS — BP 140/80 | HR 76 | Ht 72.0 in | Wt 234.1 lb

## 2020-10-07 DIAGNOSIS — I83893 Varicose veins of bilateral lower extremities with other complications: Secondary | ICD-10-CM | POA: Diagnosis not present

## 2020-10-07 DIAGNOSIS — M25561 Pain in right knee: Secondary | ICD-10-CM | POA: Diagnosis not present

## 2020-10-07 DIAGNOSIS — I10 Essential (primary) hypertension: Secondary | ICD-10-CM

## 2020-10-07 DIAGNOSIS — J301 Allergic rhinitis due to pollen: Secondary | ICD-10-CM | POA: Diagnosis not present

## 2020-10-07 DIAGNOSIS — G8929 Other chronic pain: Secondary | ICD-10-CM

## 2020-10-07 DIAGNOSIS — M720 Palmar fascial fibromatosis [Dupuytren]: Secondary | ICD-10-CM

## 2020-10-07 NOTE — Assessment & Plan Note (Signed)
Take Claritin 10 mg p.o. daily 

## 2020-10-07 NOTE — Progress Notes (Signed)
Established Patient Office Visit  Subjective:  Patient ID: Paul Hale., male    DOB: 05/12/1946  Age: 75 y.o. MRN: [id number]  CC:  Chief Complaint  Patient presents with  . Follow-up    HPI  Paul Gee. presents for general checkupPatient is known to have hyperlipidemia leg.  Varicose veins arthritis of the knee.  He is known to have hypertension and takes statin for lipids.  Also take Prilosec for her reflux.  He used to smoke ,does not smoke anymore.  He was advised to follow DASH diet  Past Medical History:  Diagnosis Date  . Colon, diverticulosis   . GERD (gastroesophageal reflux disease)   . History of colonic polyps   . Internal hemorrhoids   . Pneumonia     Past Surgical History:  Procedure Laterality Date  . APPENDECTOMY  1963  . COLONOSCOPY WITH PROPOFOL N/A 04/25/2018   Procedure: COLONOSCOPY WITH PROPOFOL;  Surgeon: Lollie Sails, MD;  Location: [facility] ENDOSCOPY;  Service: Endoscopy;  Laterality: N/A;  . EYE SURGERY  2013  . HERNIA REPAIR  1986    Family History  Problem Relation Age of Onset  . Cancer Mother   . Cancer Father   . Cancer Maternal Uncle   . Cancer Paternal Uncle   . Heart attack Paternal Grandfather     Social History   Socioeconomic History  . Marital status: Married    Spouse name: Not on file  . Number of children: Not on file  . Years of education: Not on file  . Highest education level: Not on file  Occupational History  . Not on file  Tobacco Use  . Smoking status: Former Smoker    Years: 50.00    Quit date: 07/14/1967    Years since quitting: 53.2  . Smokeless tobacco: Never Used  Vaping Use  . Vaping Use: Never used  Substance and Sexual Activity  . Alcohol use: Not Currently  . Drug use: No  . Sexual activity: Not on file  Other Topics Concern  . Not on file  Social History Narrative  . Not on file   Social Determinants of Health   Financial Resource Strain: Not on file  Food Insecurity: Not on  file  Transportation Needs: Not on file  Physical Activity: Not on file  Stress: Not on file  Social Connections: Not on file  Intimate Partner Violence: Not on file     Current Outpatient Medications:  .  aspirin 81 MG tablet, Take 81 mg by mouth once a week. , Disp: , Rfl:  .  atorvastatin (LIPITOR) 20 MG tablet, Take 20 mg by mouth daily., Disp: , Rfl: 12 .  lisinopril (PRINIVIL,ZESTRIL) 5 MG tablet, Take 5 mg by mouth daily., Disp: , Rfl:  .  ranitidine (ZANTAC) 150 MG capsule, Take 150 mg by mouth as needed. , Disp: , Rfl:   Current Facility-Administered Medications:  .  betamethasone acetate-betamethasone sodium phosphate (CELESTONE) injection 3 mg, 3 mg, Intramuscular, Once, Paul Buckley, Paul Buckley, DPM .  betamethasone acetate-betamethasone sodium phosphate (CELESTONE) injection 3 mg, 3 mg, Intramuscular, Once, Paul Buckley, Paul Buckley, DPM .  betamethasone acetate-betamethasone sodium phosphate (CELESTONE) injection 3 mg, 3 mg, Intramuscular, Once, Paul Buckley, Paul Buckley, DPM   No Known Allergies  ROS Review of Systems  Constitutional: Negative.   HENT: Negative.   Eyes: Negative.   Respiratory: Negative.   Cardiovascular: Negative.   Gastrointestinal: Negative.   Endocrine: Negative.   Genitourinary: Negative.  Musculoskeletal: Negative.   Skin: Negative.   Allergic/Immunologic: Negative.   Neurological: Negative.   Hematological: Negative.   Psychiatric/Behavioral: Negative.   All other systems reviewed and are negative.     Objective:    Physical Exam Vitals reviewed.  Constitutional:      Appearance: Normal appearance.  HENT:     Mouth/Throat:     Mouth: Mucous membranes are moist.  Eyes:     Pupils: Pupils are equal, round, and reactive to light.  Neck:     Vascular: No carotid bruit.  Cardiovascular:     Rate and Rhythm: Normal rate and regular rhythm.     Pulses: Normal pulses.     Heart sounds: Normal heart sounds.  Pulmonary:     Effort: Pulmonary effort is normal.      Breath sounds: Normal breath sounds.  Abdominal:     General: Bowel sounds are normal.     Palpations: Abdomen is soft. There is no hepatomegaly, splenomegaly or mass.     Tenderness: There is no abdominal tenderness.     Hernia: No hernia is present.  Musculoskeletal:     Cervical back: Neck supple.     Right lower leg: No edema.     Left lower leg: No edema.  Skin:    Findings: No rash.  Neurological:     Mental Status: He is alert and oriented to person, place, and time.     Motor: No weakness.  Psychiatric:        Mood and Affect: Mood normal.        Behavior: Behavior normal.     BP 140/80   Pulse 76   Ht 6' (1.829 Buckley)   Wt 234 lb 1.6 oz (106.2 kg)   BMI 31.75 kg/Buckley  Wt Readings from Last 3 Encounters:  10/07/20 234 lb 1.6 oz (106.2 kg)  08/26/20 226 lb 8 oz (102.7 kg)  07/31/20 225 lb 12.8 oz (102.4 kg)     Health Maintenance Due  Topic Date Due  . Hepatitis C Screening  Never done  . PNA vac Low Risk Adult (1 of 2 - PCV13) Never done    There are no preventive care reminders to display for this patient.  Lab Results  Component Value Date   TSH 1.63 05/14/2020   Lab Results  Component Value Date   WBC 7.0 05/14/2020   HGB 13.7 05/14/2020   HCT 41.1 05/14/2020   MCV 97.6 05/14/2020   PLT 278 05/14/2020   Lab Results  Component Value Date   NA 140 05/14/2020   K 4.1 05/14/2020   CO2 27 05/14/2020   GLUCOSE 95 05/14/2020   BUN 19 05/14/2020   CREATININE 1.03 05/14/2020   BILITOT 0.4 05/14/2020   AST 17 05/14/2020   ALT 11 05/14/2020   PROT 6.2 05/14/2020   ALBUMIN 3.8 06/02/2016   CALCIUM 8.8 05/14/2020   ANIONGAP 4 (L) 06/02/2016   Lab Results  Component Value Date   CHOL 111 05/14/2020   Lab Results  Component Value Date   HDL 47 05/14/2020   Lab Results  Component Value Date   LDLCALC 53 05/14/2020   Lab Results  Component Value Date   TRIG 38 05/14/2020   Lab Results  Component Value Date   CHOLHDL 2.4 05/14/2020   No  results found for: HGBA1C    Assessment & Plan:   Problem List Items Addressed This Visit      Cardiovascular and Mediastinum   Varicose  veins of bilateral lower extremities with other complications    Stable at the present time      Essential hypertension - Primary    Patient blood pressure is normal patient denies any chest pain or shortness of breath there is no history of palpitation paroxysmal nocturnal dyspnea patient can walkioo yards without any problem patient was advised to follow low-salt low-cholesterol diet  I reviewed the results of Sprint trial  ideally I want to keep systolic blood pressure below 130 mmHg, patient was asked to check blood pressure 3 times a week and give me a report on that.  Patient will be follow-up in 3 months, patient will call me back for any change in the cardiovascular symptoms           Respiratory   Seasonal allergic rhinitis due to pollen    Take Claritin 10 mg p.o. daily        Musculoskeletal and Integument   Dupuytren's disease of palm    Stable at present time        Other   Pain in right knee    Did not complain of any knee pain  neck pain.  Varicose veins are present there is no evidence of any blood clot.  Sensation is intact in the feet.         No orders of the defined types were placed in this encounter.   Follow-up: No follow-ups on file.    Cletis Athens, MD

## 2020-10-07 NOTE — Assessment & Plan Note (Signed)
Did not complain of any knee pain  neck pain.  Varicose veins are present there is no evidence of any blood clot.  Sensation is intact in the feet.

## 2020-10-07 NOTE — Assessment & Plan Note (Signed)
Stable at present time

## 2020-10-07 NOTE — Assessment & Plan Note (Signed)

## 2020-10-07 NOTE — Assessment & Plan Note (Signed)
Stable at the present time. 

## 2020-11-01 DIAGNOSIS — M5136 Other intervertebral disc degeneration, lumbar region: Secondary | ICD-10-CM | POA: Diagnosis not present

## 2020-11-01 DIAGNOSIS — M955 Acquired deformity of pelvis: Secondary | ICD-10-CM | POA: Diagnosis not present

## 2020-11-01 DIAGNOSIS — M9905 Segmental and somatic dysfunction of pelvic region: Secondary | ICD-10-CM | POA: Diagnosis not present

## 2020-11-01 DIAGNOSIS — M9903 Segmental and somatic dysfunction of lumbar region: Secondary | ICD-10-CM | POA: Diagnosis not present

## 2020-11-29 ENCOUNTER — Ambulatory Visit
Admission: RE | Admit: 2020-11-29 | Discharge: 2020-11-29 | Disposition: A | Discharge status: Home / Self Care | Payer: Medicare Other | Source: Ambulatory Visit | Attending: Family Medicine | Admitting: Family Medicine

## 2020-11-29 ENCOUNTER — Encounter: Payer: Self-pay | Admitting: Family Medicine

## 2020-11-29 ENCOUNTER — Other Ambulatory Visit: Payer: Self-pay

## 2020-11-29 ENCOUNTER — Ambulatory Visit (INDEPENDENT_AMBULATORY_CARE_PROVIDER_SITE_OTHER): Payer: Medicare Other | Admitting: Family Medicine

## 2020-11-29 VITALS — BP 155/82 | HR 74 | Ht 72.0 in | Wt 231.5 lb

## 2020-11-29 DIAGNOSIS — M5136 Other intervertebral disc degeneration, lumbar region: Secondary | ICD-10-CM | POA: Diagnosis not present

## 2020-11-29 DIAGNOSIS — M955 Acquired deformity of pelvis: Secondary | ICD-10-CM | POA: Diagnosis not present

## 2020-11-29 DIAGNOSIS — R600 Localized edema: Secondary | ICD-10-CM | POA: Diagnosis not present

## 2020-11-29 DIAGNOSIS — M9903 Segmental and somatic dysfunction of lumbar region: Secondary | ICD-10-CM | POA: Diagnosis not present

## 2020-11-29 DIAGNOSIS — M79604 Pain in right leg: Secondary | ICD-10-CM | POA: Insufficient documentation

## 2020-11-29 DIAGNOSIS — M9905 Segmental and somatic dysfunction of pelvic region: Secondary | ICD-10-CM | POA: Diagnosis not present

## 2020-11-29 DIAGNOSIS — I82811 Embolism and thrombosis of superficial veins of right lower extremities: Secondary | ICD-10-CM | POA: Diagnosis not present

## 2020-11-29 MED ORDER — CEPHALEXIN 500 MG PO CAPS: 500.0000 mg | ORAL_CAPSULE | Freq: Two times a day (BID) | ORAL | 0 refills | Status: DC

## 2020-12-05 ENCOUNTER — Encounter: Payer: Self-pay | Admitting: Family Medicine

## 2020-12-05 ENCOUNTER — Other Ambulatory Visit: Payer: Self-pay

## 2020-12-05 ENCOUNTER — Ambulatory Visit (INDEPENDENT_AMBULATORY_CARE_PROVIDER_SITE_OTHER): Payer: Medicare Other | Admitting: Family Medicine

## 2020-12-05 VITALS — BP 145/78 | HR 78 | Ht 72.0 in | Wt 233.3 lb

## 2020-12-05 DIAGNOSIS — L03115 Cellulitis of right lower limb: Secondary | ICD-10-CM | POA: Insufficient documentation

## 2020-12-05 MED ORDER — CEPHALEXIN 500 MG PO CAPS: 500.0000 mg | ORAL_CAPSULE | Freq: Four times a day (QID) | ORAL | 0 refills | Status: DC

## 2020-12-05 NOTE — Progress Notes (Signed)
Established Patient Office Visit  SUBJECTIVE:  Subjective  Patient ID: Paul Busby., male    DOB: 1945/08/05  Age: 75 y.o. MRN: [id number]  CC:  Chief Complaint  Patient presents with  . Leg Pain    Patient having pain in right leg that started 2 days ago     HPI Paul Popelka. is a 75 y.o. male presenting today for R leg pain with redness and mild swelling.   Past Medical History:  Diagnosis Date  . Colon, diverticulosis   . GERD (gastroesophageal reflux disease)   . History of colonic polyps   . Internal hemorrhoids   . Pneumonia     Past Surgical History:  Procedure Laterality Date  . APPENDECTOMY  1963  . COLONOSCOPY WITH PROPOFOL N/A 04/25/2018   Procedure: COLONOSCOPY WITH PROPOFOL;  Surgeon: Lollie Sails, MD;  Location: [facility] ENDOSCOPY;  Service: Endoscopy;  Laterality: N/A;  . EYE SURGERY  2013  . HERNIA REPAIR  1986    Family History  Problem Relation Age of Onset  . Cancer Mother   . Cancer Father   . Cancer Maternal Uncle   . Cancer Paternal Uncle   . Heart attack Paternal Grandfather     Social History   Socioeconomic History  . Marital status: Married    Spouse name: Not on file  . Number of children: Not on file  . Years of education: Not on file  . Highest education level: Not on file  Occupational History  . Not on file  Tobacco Use  . Smoking status: Former Smoker    Years: 50.00    Quit date: 07/14/1967    Years since quitting: 53.4  . Smokeless tobacco: Never Used  Vaping Use  . Vaping Use: Never used  Substance and Sexual Activity  . Alcohol use: Not Currently  . Drug use: No  . Sexual activity: Not on file  Other Topics Concern  . Not on file  Social History Narrative  . Not on file   Social Determinants of Health   Financial Resource Strain: Not on file  Food Insecurity: Not on file  Transportation Needs: Not on file  Physical Activity: Not on file  Stress: Not on file  Social Connections: Not on file   Intimate Partner Violence: Not on file     Current Outpatient Medications:  .  aspirin 81 MG tablet, Take 81 mg by mouth once a week. , Disp: , Rfl:  .  atorvastatin (LIPITOR) 20 MG tablet, Take 20 mg by mouth daily., Disp: , Rfl: 12 .  lisinopril (PRINIVIL,ZESTRIL) 5 MG tablet, Take 5 mg by mouth daily., Disp: , Rfl:  .  ranitidine (ZANTAC) 150 MG capsule, Take 150 mg by mouth as needed. , Disp: , Rfl:  .  cephALEXin (KEFLEX) 500 MG capsule, Take 1 capsule (500 mg total) by mouth 4 (four) times daily., Disp: 10 capsule, Rfl: 0  Current Facility-Administered Medications:  .  betamethasone acetate-betamethasone sodium phosphate (CELESTONE) injection 3 mg, 3 mg, Intramuscular, Once, Evans, Brent M, DPM .  betamethasone acetate-betamethasone sodium phosphate (CELESTONE) injection 3 mg, 3 mg, Intramuscular, Once, Evans, Brent M, DPM .  betamethasone acetate-betamethasone sodium phosphate (CELESTONE) injection 3 mg, 3 mg, Intramuscular, Once, Evans, Brent M, DPM   No Known Allergies  ROS Review of Systems  Constitutional: Negative.   HENT: Negative.   Respiratory: Negative.   Cardiovascular: Negative.   Musculoskeletal: Negative.   Skin: Positive for color change.  Neurological: Negative.   Psychiatric/Behavioral: Negative.      OBJECTIVE:    Physical Exam Vitals and nursing note reviewed.  HENT:     Head: Normocephalic.     Nose: Nose normal.     Mouth/Throat:     Mouth: Mucous membranes are moist.  Musculoskeletal:        General: Normal range of motion.  Skin:    General: Skin is warm.     Findings: Erythema present.  Neurological:     Mental Status: He is alert.     BP (!) 155/82   Pulse 74   Ht 6' (1.829 m)   Wt 231 lb 8 oz (105 kg)   BMI 31.40 kg/m  Wt Readings from Last 3 Encounters:  12/05/20 233 lb 4.8 oz (105.8 kg)  11/29/20 231 lb 8 oz (105 kg)  10/07/20 234 lb 1.6 oz (106.2 kg)    Health Maintenance Due  Topic Date Due  . Hepatitis C Screening   Never done  . PNA vac Low Risk Adult (1 of 2 - PCV13) Never done    There are no preventive care reminders to display for this patient.  CBC Latest Ref Rng & Units 05/14/2020 05/26/2016 07/04/2014  WBC 3.8 - 10.8 Thousand/uL 7.0 5.5 6.8  Hemoglobin 13.2 - 17.1 g/dL 13.7 14.3 12.7(L)  Hematocrit 38.5 - 50.0 % 41.1 41.8 38.2(L)  Platelets 140 - 400 Thousand/uL 278 243 247   CMP Latest Ref Rng & Units 05/14/2020 06/02/2016 05/26/2016  Glucose 65 - 99 mg/dL 95 - 97  BUN 7 - 25 mg/dL 19 - 20  Creatinine 0.70 - 1.18 mg/dL 1.03 - 0.97  Sodium 135 - 146 mmol/L 140 140 138  Potassium 3.5 - 5.3 mmol/L 4.1 4.5 4.2  Chloride 98 - 110 mmol/L 105 107 105  CO2 20 - 32 mmol/L 27 29 29   Calcium 8.6 - 10.3 mg/dL 8.8 8.9 8.4(L)  Total Protein 6.1 - 8.1 g/dL 6.2 - -  Total Bilirubin 0.2 - 1.2 mg/dL 0.4 - -  AST 10 - 35 U/L 17 24 -  ALT 9 - 46 U/L 11 - -    Lab Results  Component Value Date   TSH 1.63 05/14/2020   Lab Results  Component Value Date   ALBUMIN 3.8 06/02/2016   ANIONGAP 4 (L) 06/02/2016   Lab Results  Component Value Date   CHOL 111 05/14/2020   CHOL 108 05/26/2016   HDL 47 05/14/2020   HDL 42 05/26/2016   LDLCALC 53 05/14/2020   LDLCALC 56 05/26/2016   CHOLHDL 2.4 05/14/2020   CHOLHDL 2.6 05/26/2016   Lab Results  Component Value Date   TRIG 38 05/14/2020   No results found for: HGBA1C    ASSESSMENT & PLAN:   Problem List Items Addressed This Visit      Other   Right leg pain - Primary    Patient with R leg pain and redness medial R knee. He has had varicose veins for years and has never had a DVT. Pain is superficial he says and somewhat painful to the touch.  Plan- Order [place] r/o DVT. Abx to cover Phlebitis       Relevant Orders   US Venous Img Lower Unilateral Right (Completed)      Meds ordered this encounter  Medications  . DISCONTD: cephALEXin (KEFLEX) 500 MG capsule    Sig: Take 1 capsule (500 mg total) by mouth 2 (two) times daily.    Dispense:  10 capsule    Refill:  0      Follow-up: No follow-ups on file.    Beckie Salts, [place] [address] [place], [place] [address]

## 2020-12-05 NOTE — Assessment & Plan Note (Signed)
Patient with continued pain R leg but now it is above previous site medial R knee. He had the [place] last Friday, no DVT, he finished all Keflex but the redness just moved up.   Plan- Increase Keflex to QID x 10 days, also we are having him see Dr Lucky Cowboy Tuesday the 31st of May.

## 2020-12-05 NOTE — Progress Notes (Signed)
Established Patient Office Visit  SUBJECTIVE:  Subjective  Patient ID: Paul Zieger., male    DOB: 04-10-1946  Age: 75 y.o. MRN: [id number]  CC:  Chief Complaint  Patient presents with  . Leg Pain    Patient here for 1 week follow up for right leg pain. Patient's ultrasound was negative for DVT and was treated with antibiotic for phlebitis.      HPI Paul Patella. is a 75 y.o. male presenting today for     Past Medical History:  Diagnosis Date  . Colon, diverticulosis   . GERD (gastroesophageal reflux disease)   . History of colonic polyps   . Internal hemorrhoids   . Pneumonia     Past Surgical History:  Procedure Laterality Date  . APPENDECTOMY  1963  . COLONOSCOPY WITH PROPOFOL N/A 04/25/2018   Procedure: COLONOSCOPY WITH PROPOFOL;  Surgeon: Lollie Sails, MD;  Location: [facility] ENDOSCOPY;  Service: Endoscopy;  Laterality: N/A;  . EYE SURGERY  2013  . HERNIA REPAIR  1986    Family History  Problem Relation Age of Onset  . Cancer Mother   . Cancer Father   . Cancer Maternal Uncle   . Cancer Paternal Uncle   . Heart attack Paternal Grandfather     Social History   Socioeconomic History  . Marital status: Married    Spouse name: Not on file  . Number of children: Not on file  . Years of education: Not on file  . Highest education level: Not on file  Occupational History  . Not on file  Tobacco Use  . Smoking status: Former Smoker    Years: 50.00    Quit date: 07/14/1967    Years since quitting: 53.4  . Smokeless tobacco: Never Used  Vaping Use  . Vaping Use: Never used  Substance and Sexual Activity  . Alcohol use: Not Currently  . Drug use: No  . Sexual activity: Not on file  Other Topics Concern  . Not on file  Social History Narrative  . Not on file   Social Determinants of Health   Financial Resource Strain: Not on file  Food Insecurity: Not on file  Transportation Needs: Not on file  Physical Activity: Not on file  Stress:  Not on file  Social Connections: Not on file  Intimate Partner Violence: Not on file     Current Outpatient Medications:  .  aspirin 81 MG tablet, Take 81 mg by mouth once a week. , Disp: , Rfl:  .  atorvastatin (LIPITOR) 20 MG tablet, Take 20 mg by mouth daily., Disp: , Rfl: 12 .  lisinopril (PRINIVIL,ZESTRIL) 5 MG tablet, Take 5 mg by mouth daily., Disp: , Rfl:  .  ranitidine (ZANTAC) 150 MG capsule, Take 150 mg by mouth as needed. , Disp: , Rfl:  .  cephALEXin (KEFLEX) 500 MG capsule, Take 1 capsule (500 mg total) by mouth 4 (four) times daily., Disp: 10 capsule, Rfl: 0  Current Facility-Administered Medications:  .  betamethasone acetate-betamethasone sodium phosphate (CELESTONE) injection 3 mg, 3 mg, Intramuscular, Once, Evans, Brent M, DPM .  betamethasone acetate-betamethasone sodium phosphate (CELESTONE) injection 3 mg, 3 mg, Intramuscular, Once, Evans, Brent M, DPM .  betamethasone acetate-betamethasone sodium phosphate (CELESTONE) injection 3 mg, 3 mg, Intramuscular, Once, Evans, Brent M, DPM   No Known Allergies  ROS Review of Systems  Constitutional: Negative.   HENT: Negative.   Respiratory: Negative.   Cardiovascular: Negative for palpitations and leg  swelling.  Gastrointestinal: Negative.   Psychiatric/Behavioral: Negative.      OBJECTIVE:    Physical Exam Vitals and nursing note reviewed.  Constitutional:      Appearance: Normal appearance.  HENT:     Head: Normocephalic.  Cardiovascular:     Rate and Rhythm: Normal rate and regular rhythm.  Pulmonary:     Effort: Pulmonary effort is normal.  Musculoskeletal:        General: Normal range of motion.     Cervical back: Normal range of motion.  Skin:    Findings: Erythema present.  Neurological:     Mental Status: He is alert.  Psychiatric:        Mood and Affect: Mood normal.     BP (!) 145/78   Pulse 78   Ht 6' (1.829 m)   Wt 233 lb 4.8 oz (105.8 kg)   BMI 31.64 kg/m  Wt Readings from Last 3  Encounters:  12/05/20 233 lb 4.8 oz (105.8 kg)  11/29/20 231 lb 8 oz (105 kg)  10/07/20 234 lb 1.6 oz (106.2 kg)    Health Maintenance Due  Topic Date Due  . Hepatitis C Screening  Never done  . PNA vac Low Risk Adult (1 of 2 - PCV13) Never done    There are no preventive care reminders to display for this patient.  CBC Latest Ref Rng & Units 05/14/2020 05/26/2016 07/04/2014  WBC 3.8 - 10.8 Thousand/uL 7.0 5.5 6.8  Hemoglobin 13.2 - 17.1 g/dL 13.7 14.3 12.7(L)  Hematocrit 38.5 - 50.0 % 41.1 41.8 38.2(L)  Platelets 140 - 400 Thousand/uL 278 243 247   CMP Latest Ref Rng & Units 05/14/2020 06/02/2016 05/26/2016  Glucose 65 - 99 mg/dL 95 - 97  BUN 7 - 25 mg/dL 19 - 20  Creatinine 0.70 - 1.18 mg/dL 1.03 - 0.97  Sodium 135 - 146 mmol/L 140 140 138  Potassium 3.5 - 5.3 mmol/L 4.1 4.5 4.2  Chloride 98 - 110 mmol/L 105 107 105  CO2 20 - 32 mmol/L 27 29 29   Calcium 8.6 - 10.3 mg/dL 8.8 8.9 8.4(L)  Total Protein 6.1 - 8.1 g/dL 6.2 - -  Total Bilirubin 0.2 - 1.2 mg/dL 0.4 - -  AST 10 - 35 U/L 17 24 -  ALT 9 - 46 U/L 11 - -    Lab Results  Component Value Date   TSH 1.63 05/14/2020   Lab Results  Component Value Date   ALBUMIN 3.8 06/02/2016   ANIONGAP 4 (L) 06/02/2016   Lab Results  Component Value Date   CHOL 111 05/14/2020   CHOL 108 05/26/2016   HDL 47 05/14/2020   HDL 42 05/26/2016   LDLCALC 53 05/14/2020   LDLCALC 56 05/26/2016   CHOLHDL 2.4 05/14/2020   CHOLHDL 2.6 05/26/2016   Lab Results  Component Value Date   TRIG 38 05/14/2020   No results found for: HGBA1C    ASSESSMENT & PLAN:   Problem List Items Addressed This Visit      Other   Cellulitis of right lower extremity - Primary    Patient with continued pain R leg but now it is above previous site medial R knee. He had the [place] last Friday, no DVT, he finished all Keflex but the redness just moved up.   Plan- Increase Keflex to QID x 10 days, also we are having him see Dr Lucky Cowboy Tuesday the 31st of May.        Relevant Orders  Ambulatory referral to Vascular Surgery      Meds ordered this encounter  Medications  . cephALEXin (KEFLEX) 500 MG capsule    Sig: Take 1 capsule (500 mg total) by mouth 4 (four) times daily.    Dispense:  10 capsule    Refill:  0      Follow-up: No follow-ups on file.    Beckie Salts, [place] [address] [place], [place] [address]

## 2020-12-05 NOTE — Assessment & Plan Note (Signed)
Patient with R leg pain and redness medial R knee. He has had varicose veins for years and has never had a DVT. Pain is superficial he says and somewhat painful to the touch.  Plan- Order [place] r/o DVT. Abx to cover Phlebitis

## 2020-12-10 ENCOUNTER — Encounter (INDEPENDENT_AMBULATORY_CARE_PROVIDER_SITE_OTHER): Payer: Self-pay | Admitting: Vascular Surgery

## 2020-12-10 ENCOUNTER — Ambulatory Visit (INDEPENDENT_AMBULATORY_CARE_PROVIDER_SITE_OTHER): Payer: Medicare Other | Admitting: Vascular Surgery

## 2020-12-10 ENCOUNTER — Other Ambulatory Visit: Payer: Self-pay

## 2020-12-10 VITALS — BP 171/91 | HR 77 | Ht 72.0 in | Wt 234.0 lb

## 2020-12-10 DIAGNOSIS — I809 Phlebitis and thrombophlebitis of unspecified site: Secondary | ICD-10-CM | POA: Insufficient documentation

## 2020-12-10 DIAGNOSIS — I8001 Phlebitis and thrombophlebitis of superficial vessels of right lower extremity: Secondary | ICD-10-CM

## 2020-12-10 DIAGNOSIS — I83893 Varicose veins of bilateral lower extremities with other complications: Secondary | ICD-10-CM | POA: Diagnosis not present

## 2020-12-10 DIAGNOSIS — I10 Essential (primary) hypertension: Secondary | ICD-10-CM

## 2020-12-10 DIAGNOSIS — E785 Hyperlipidemia, unspecified: Secondary | ICD-10-CM | POA: Diagnosis not present

## 2020-12-10 NOTE — Patient Instructions (Signed)
Thrombophlebitis Thrombophlebitis is a condition in which a blood clot forms in a vein. This can happen in your arms or legs, or in the area between your neck and groin (torso). When this condition happens in a vein that is close to the surface of the body (superficial thrombophlebitis), it is usually not serious.However, when the condition happens in a vein that is deep inside the body (deep vein thrombosis, DVT), it can cause serious problems. What are the causes? This condition may be caused by:  Damage to a vein.  Inflammation of the veins.  A condition that causes blood to clot more easily.  Reduced blood flow through the veins. What increases the risk? The following factors may make you more likely to develop this condition:  Having a condition that makes blood thicker or more likely to clot.  Having an infection.  Having major surgery.  Experiencing a traumatic injury or a broken bone.  Having a catheter in a vein (central line).  Having a condition in which valves in the veins do not work properly, causing blood to collect (pool) in the veins (chronic venous insufficiency).  An inactive (sedentary) lifestyle.  Pregnancy or having recently given birth.  Cancer.  Older age, especially being 33 or older.  Obesity.  Smoking.  Taking medicines that contain estrogen, such as birth control pills.  Having varicose veins.  Using drugs that are injected into the veins (intravenous, IV). What are the signs or symptoms? The main symptoms of this condition are:  Swelling and pain in an arm or leg. If the affected vein is in the leg, you may feel pain while standing or walking.  Warmth or redness in an arm or leg. Other symptoms include:  Low-grade fever.  Muscle aches.  A bulging vein (venous distension). In some cases, there are no symptoms. How is this diagnosed? This condition may be diagnosed based on:  Your symptoms and medical history.  A physical  exam.  Tests, such as: ? Blood tests. ? A test that uses sound waves to make images (ultrasound). How is this treated? Treatment depends on how severe the condition is and which area of the body is affected. Treatment may include:  Applying a warm compress or heating pad to affected areas.  Wearing compression stockings to help prevent blood clots and reduce swelling in your legs.  Raising (elevating) the affected arm or leg above the level of your heart.  Medicines, such as: ? Anti-inflammatory medicines, such as ibuprofen. ? Blood thinners (anticoagulants), such as heparin. ? Antibiotic medicine, if you have an infection.  Removing an IV that may be causing the problem. In rare cases, surgery may be needed to:  Remove a damaged section of a vein.  Place a filter in a large vein to catch blood clots before they reach the lungs. Follow these instructions at home: Medicines  Take over-the-counter and prescription medicines only as told by your health care provider.  If you were prescribed an antibiotic, take it as told by your health care provider. Do not stop using the antibiotic even if you feel better. Managing pain, stiffness, and swelling  If directed, put heat on the affected area as often as told by your health care provider. Use the heat source that your health care provider recommends, such as a moist heat pack or a heating pad. ? Place a towel between your skin and the heat source. ? Leave the heat on for 20-30 minutes. ? Remove the heat if  your skin turns bright red. This is especially important if you are not able to feel pain, heat, or cold. You may have a greater risk of getting burned.  Elevate the affected area above the level of your heart while you are sitting or lying down.   Activity  Return to your normal activities as told by your health care provider. Ask your health care provider what activities are safe for you.  Avoid sitting or lying down for long  periods. If possible, stand up and walk around regularly. If you are taking blood thinners:  Take your medicine exactly as told, at the same time every day.  Avoid activities that could cause injury or bruising, and follow instructions about how to prevent falls.  Wear a medical alert bracelet or carry a card that lists what medicines you take. General instructions  Drink enough fluid to keep your urine pale yellow.  Wear compression stockings as told by your health care provider.  Do not use any products that contain nicotine or tobacco, such as cigarettes and e-cigarettes. If you need help quitting, ask your health care provider.  Keep all follow-up visits as told by your health care provider. This is important. Contact a health care provider if:  You miss a dose of your blood thinner, if applicable.  Your symptoms do not improve.  You have unusual bruising.  You have nausea, vomiting, or diarrhea that lasts for more than one day. Get help right away if:  You have any of these problems: ? New or worse pain, swelling, or redness in an arm or leg. ? Numbness or tingling in an arm or leg. ? Shortness of breath. ? Chest pain. ? Severe pain in your abdomen. ? Fast breathing. ? A fast or irregular heartbeat. ? Blood in your vomit, stool, or urine. ? A severe headache or confusion. ? A cut that does not stop bleeding.  You feel light-headed or dizzy.  You cough up blood.  You have a serious fall or accident, or you hit your head. These symptoms may represent a serious problem that is an emergency. Do not wait to see if the symptoms will go away. Get medical help right away. Call your local emergency services (911 in the [place] Do not drive yourself to the hospital. Summary  Thrombophlebitis is a condition in which a blood clot forms in a vein. This can happen in a vein close to the surface of the body or a vein deep inside the body.  This condition can cause serious  problems when it happens in a vein deep inside the body (deep vein thrombosis, DVT).  The main symptom of this condition is swelling and pain around the affected vein.  Treatment may include warm compresses, anti-inflammatory medicines, or blood thinners. This information is not intended to replace advice given to you by your health care provider. Make sure you discuss any questions you have with your health care provider. Document Revised: 11/20/2019 Document Reviewed: 11/20/2019 Elsevier Patient Education  [place].

## 2020-12-10 NOTE — Assessment & Plan Note (Signed)
The patient has had progression of superficial thrombophlebitis since initial diagnosis about 2 weeks ago despite appropriate anti-inflammatories and warm compresses.  Given the extension up into the thigh, I think would be prudent to start him on anticoagulation at this time.  He also has a strong family history so he may have a hypercoagulable condition.  I have given him a prescription for 5 mg of Eliquis twice daily and we will recheck this with an ultrasound in 3 to 4 weeks.

## 2020-12-10 NOTE — Assessment & Plan Note (Signed)
lipid control important in reducing the progression of atherosclerotic disease. Continue statin therapy  

## 2020-12-10 NOTE — Assessment & Plan Note (Signed)
Longstanding history of varicose veins.  Now has superficial thrombophlebitis.  Once this has resolved, may consider intervention to avoid future problems.

## 2020-12-10 NOTE — Assessment & Plan Note (Signed)
blood pressure control important in reducing the progression of atherosclerotic disease. On appropriate oral medications.  

## 2020-12-10 NOTE — Progress Notes (Signed)
Patient ID: Paul Buckley., male   DOB: February 06, 1946, 75 y.o.   MRN: [id number]  Chief Complaint  Patient presents with  . New Patient (Initial Visit)    New studies ruled out DVT / clot  infection of vein  RLE - referred by [facility]    HPI Paul Buckley. is a 75 y.o. male.  I am asked to see the patient by Gerlean Ren for evaluation of leg pain and swelling.  About 2 weeks ago, the patient had a tender area on the right medial calf had an ultrasound showing some superficial thrombophlebitis on the right leg.  Since that time, despite appropriately using anti-inflammatories and elevating legs, the redness, pain, and firm induration has progressed up into his lower to mid thigh.  No chest pain or shortness of breath.  No fever or chills.  He reports having a sister who had a blood clot in her leg go to her lungs and kill her.  He denies any chest pain or shortness of breath.     Past Medical History:  Diagnosis Date  . Colon, diverticulosis   . GERD (gastroesophageal reflux disease)   . History of colonic polyps   . Internal hemorrhoids   . Pneumonia     Past Surgical History:  Procedure Laterality Date  . APPENDECTOMY  1963  . COLONOSCOPY WITH PROPOFOL N/A 04/25/2018   Procedure: COLONOSCOPY WITH PROPOFOL;  Surgeon: Lollie Sails, MD;  Location: [facility] ENDOSCOPY;  Service: Endoscopy;  Laterality: N/A;  . EYE SURGERY  2013  . HERNIA REPAIR  1986     Family History  Problem Relation Age of Onset  . Cancer Mother   . Cancer Father   . Cancer Maternal Uncle   . Cancer Paternal Uncle   . Heart attack Paternal Grandfather   Sister died of a DVT/PE.    Social History   Tobacco Use  . Smoking status: Former Smoker    Years: 50.00    Quit date: 07/14/1967    Years since quitting: 53.4  . Smokeless tobacco: Never Used  Vaping Use  . Vaping Use: Never used  Substance Use Topics  . Alcohol use: Not Currently  . Drug use: No     No Known Allergies  Current  Outpatient Medications  Medication Sig Dispense Refill  . aspirin 81 MG tablet Take 81 mg by mouth once a week.     [name] atorvastatin (LIPITOR) 20 MG tablet Take 20 mg by mouth daily. (Patient not taking: Reported on 12/10/2020)  12  . cephALEXin (KEFLEX) 500 MG capsule Take 1 capsule (500 mg total) by mouth 4 (four) times daily. (Patient not taking: Reported on 12/10/2020) 10 capsule 0  . lisinopril (PRINIVIL,ZESTRIL) 5 MG tablet Take 5 mg by mouth daily. (Patient not taking: Reported on 12/10/2020)    . losartan (COZAAR) 100 MG tablet Take 1 tablet by mouth daily.    . ranitidine (ZANTAC) 150 MG capsule Take 150 mg by mouth as needed.  (Patient not taking: Reported on 12/10/2020)     Current Facility-Administered Medications  Medication Dose Route Frequency Provider Last Rate Last Admin  . betamethasone acetate-betamethasone sodium phosphate (CELESTONE) injection 3 mg  3 mg Intramuscular Once Daylene Katayama M, DPM      . betamethasone acetate-betamethasone sodium phosphate (CELESTONE) injection 3 mg  3 mg Intramuscular Once Daylene Katayama M, DPM      . betamethasone acetate-betamethasone sodium phosphate (CELESTONE) injection 3 mg  3  mg Intramuscular Once Edrick Kins, DPM          REVIEW OF SYSTEMS (Negative unless checked)  Constitutional: [] Weight loss  [] Fever  [] Chills Cardiac: [] Chest pain   [] Chest pressure   [] Palpitations   [] Shortness of breath when laying flat   [] Shortness of breath at rest   [] Shortness of breath with exertion. Vascular:  [x] Pain in legs with walking   [x] Pain in legs at rest   [] Pain in legs when laying flat   [] Claudication   [] Pain in feet when walking  [] Pain in feet at rest  [] Pain in feet when laying flat   [] History of DVT   [x] Phlebitis   [x] Swelling in legs   [x] Varicose veins   [] Non-healing ulcers Pulmonary:   [] Uses home oxygen   [] Productive cough   [] Hemoptysis   [] Wheeze  [] COPD   [] Asthma Neurologic:  [] Dizziness  [] Blackouts   [] Seizures   [] History of  stroke   [] History of TIA  [] Aphasia   [] Temporary blindness   [] Dysphagia   [] Weakness or numbness in arms   [] Weakness or numbness in legs Musculoskeletal:  [x] Arthritis   [] Joint swelling   [x] Joint pain   [] Low back pain Hematologic:  [] Easy bruising  [] Easy bleeding   [] Hypercoagulable state   [] Anemic  [] Hepatitis Gastrointestinal:  [] Blood in stool   [] Vomiting blood  [x] Gastroesophageal reflux/heartburn   [] Abdominal pain Genitourinary:  [] Chronic kidney disease   [] Difficult urination  [] Frequent urination  [] Burning with urination   [] Hematuria Skin:  [] Rashes   [] Ulcers   [] Wounds Psychological:  [] History of anxiety   []  History of major depression.    Physical Exam BP (!) 171/91   Pulse 77   Ht 6' (1.829 m)   Wt 234 lb (106.1 kg)   BMI 31.74 kg/m  Gen:  WD/WN, NAD.  Appears younger than stated age Head: [place]/AT, No temporalis wasting.  Ear/Nose/Throat: Hearing grossly intact, nares w/o erythema or drainage, oropharynx w/o Erythema/Exudate Eyes: Conjunctiva clear, sclera non-icteric  Neck: trachea midline.  No JVD.  Pulmonary:  Good air movement, respirations not labored, no use of accessory muscles  Cardiac: RRR, no JVD Vascular:  Vessel Right Left  Radial Palpable Palpable                                   Gastrointestinal:. No masses, surgical incisions, or scars. Musculoskeletal: M/S 5/5 throughout.  Extremities without ischemic changes.  No deformity or atrophy.  Tender, erythematous cord in the right medial calf and lower thigh area consistent with his superficial thrombophlebitis.  1+ right lower extremity edema. Neurologic: Sensation grossly intact in extremities.  Symmetrical.  Speech is fluent. Motor exam as listed above. Psychiatric: Judgment intact, Mood & affect appropriate for pt's clinical situation. Dermatologic: No rashes or ulcers noted.  No cellulitis or open wounds.    Radiology US Venous Img Lower Unilateral Right  Result Date:  11/29/2020 CLINICAL DATA:  Right lower extremity pain and edema for the past 3 days. Evaluate for DVT. EXAM: RIGHT LOWER EXTREMITY VENOUS DOPPLER ULTRASOUND TECHNIQUE: Gray-scale sonography with graded compression, as well as color Doppler and duplex ultrasound were performed to evaluate the lower extremity deep venous systems from the level of the common femoral vein and including the common femoral, femoral, profunda femoral, popliteal and calf veins including the posterior tibial, peroneal and gastrocnemius veins when visible. The superficial great saphenous vein was also interrogated. Spectral Doppler  was utilized to evaluate flow at rest and with distal augmentation maneuvers in the common femoral, femoral and popliteal veins. COMPARISON:  None. FINDINGS: Contralateral Common Femoral Vein: Respiratory phasicity is normal and symmetric with the symptomatic side. No evidence of thrombus. Normal compressibility. Common Femoral Vein: No evidence of thrombus. Normal compressibility, respiratory phasicity and response to augmentation. Saphenofemoral Junction: No evidence of thrombus. Normal compressibility and flow on color Doppler imaging. Profunda Femoral Vein: No evidence of thrombus. Normal compressibility and flow on color Doppler imaging. Femoral Vein: No evidence of thrombus. Normal compressibility, respiratory phasicity and response to augmentation. Popliteal Vein: No evidence of thrombus. Normal compressibility, respiratory phasicity and response to augmentation. Calf Veins: No evidence of thrombus. Normal compressibility and flow on color Doppler imaging. Superficial Great Saphenous Vein: No evidence of thrombus. Normal compressibility. Venous Reflux:  None. Other Findings: There is hypoechoic occlusive thrombus within the lesser saphenous vein at the level of the calf (image 28 and 36). Several mildly prominent though patent varicosities are noted at the level of the medial aspect of the right calf (images  37 through 39). IMPRESSION: 1. No evidence of DVT within the right lower extremity. 2. The examination is positive for age-indeterminate occlusive superficial thrombophlebitis involving the right lesser saphenous vein. Again, there is no extension of this occlusive SVT to the deep venous system of the right lower extremity. 3. Note is made of several mildly prominent though patent varicosities at the level of the medial aspect of the right calf. Electronically Signed   By: Sandi Mariscal M.D.   On: 11/29/2020 15:54    Labs No results found for this or any previous visit (from the past 2160 hour(s)).  Assessment/Plan:  Essential hypertension blood pressure control important in reducing the progression of atherosclerotic disease. On appropriate oral medications.   Hyperlipidemia lipid control important in reducing the progression of atherosclerotic disease. Continue statin therapy   Superficial thrombophlebitis The patient has had progression of superficial thrombophlebitis since initial diagnosis about 2 weeks ago despite appropriate anti-inflammatories and warm compresses.  Given the extension up into the thigh, I think would be prudent to start him on anticoagulation at this time.  He also has a strong family history so he may have a hypercoagulable condition.  I have given him a prescription for 5 mg of Eliquis twice daily and we will recheck this with an ultrasound in 3 to 4 weeks.  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  Now has superficial thrombophlebitis.  Once this has resolved, may consider intervention to avoid future problems.      Leotis Pain 12/10/2020, 10:47 AM   This note was created with Dragon medical transcription system.  Any errors from dictation are unintentional.

## 2020-12-12 ENCOUNTER — Encounter: Payer: Self-pay | Admitting: Podiatry

## 2020-12-12 ENCOUNTER — Other Ambulatory Visit: Payer: Self-pay

## 2020-12-12 ENCOUNTER — Ambulatory Visit (INDEPENDENT_AMBULATORY_CARE_PROVIDER_SITE_OTHER): Payer: Medicare Other | Admitting: Podiatry

## 2020-12-12 DIAGNOSIS — M79675 Pain in left toe(s): Secondary | ICD-10-CM | POA: Diagnosis not present

## 2020-12-12 DIAGNOSIS — B351 Tinea unguium: Secondary | ICD-10-CM | POA: Diagnosis not present

## 2020-12-12 DIAGNOSIS — M79674 Pain in right toe(s): Secondary | ICD-10-CM | POA: Diagnosis not present

## 2020-12-12 DIAGNOSIS — I872 Venous insufficiency (chronic) (peripheral): Secondary | ICD-10-CM

## 2020-12-12 NOTE — Progress Notes (Signed)
This patient returns to my office for at risk foot care.  This patient requires this care by a professional since this patient will be at risk due to having venous insufficiency.  This patient is unable to cut nails easily  himself since the patient cannot reach his nails.These nails are painful walking and wearing shoes.  This patient presents for at risk foot care today.  General Appearance  Alert, conversant and in no acute stress.  Vascular  Dorsalis pedis and posterior tibial  pulses are palpable  bilaterally.  Capillary return is within normal limits  bilaterally. Temperature is within normal limits  bilaterally.  Neurologic  Senn-Weinstein monofilament wire test within normal limits  bilaterally. Muscle power within normal limits bilaterally.  Nails Thick disfigured discolored nails with subungual debris  from hallux to fifth toes bilaterally. No evidence of bacterial infection or drainage bilaterally.  Orthopedic  No limitations of motion  feet .  No crepitus or effusions noted.  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    Skin  normotropic skin with no porokeratosis noted bilaterally.  No signs of infections or ulcers noted.     Onychomycosis  Pain in right toes  Pain in left toes  Consent was obtained for treatment procedures.   Mechanical debridement of nails 1-5  bilaterally performed with a nail nipper.  Filed with dremel without incident   Return office visit   3 months.                  Told patient to return for periodic foot care and evaluation due to potential at risk complications.     Katoria Yetman DPM  

## 2020-12-13 ENCOUNTER — Ambulatory Visit: Payer: Medicare Other | Admitting: Family Medicine

## 2020-12-26 DIAGNOSIS — M9903 Segmental and somatic dysfunction of lumbar region: Secondary | ICD-10-CM | POA: Diagnosis not present

## 2020-12-26 DIAGNOSIS — M9905 Segmental and somatic dysfunction of pelvic region: Secondary | ICD-10-CM | POA: Diagnosis not present

## 2020-12-26 DIAGNOSIS — M5136 Other intervertebral disc degeneration, lumbar region: Secondary | ICD-10-CM | POA: Diagnosis not present

## 2020-12-26 DIAGNOSIS — M955 Acquired deformity of pelvis: Secondary | ICD-10-CM | POA: Diagnosis not present

## 2021-01-07 ENCOUNTER — Encounter (INDEPENDENT_AMBULATORY_CARE_PROVIDER_SITE_OTHER): Payer: Self-pay | Admitting: Vascular Surgery

## 2021-01-07 ENCOUNTER — Other Ambulatory Visit: Payer: Self-pay

## 2021-01-07 ENCOUNTER — Ambulatory Visit (INDEPENDENT_AMBULATORY_CARE_PROVIDER_SITE_OTHER): Payer: Medicare Other

## 2021-01-07 ENCOUNTER — Ambulatory Visit (INDEPENDENT_AMBULATORY_CARE_PROVIDER_SITE_OTHER): Payer: Medicare Other | Admitting: Vascular Surgery

## 2021-01-07 VITALS — BP 163/80 | HR 71 | Resp 16 | Wt 237.4 lb

## 2021-01-07 DIAGNOSIS — I10 Essential (primary) hypertension: Secondary | ICD-10-CM

## 2021-01-07 DIAGNOSIS — I83893 Varicose veins of bilateral lower extremities with other complications: Secondary | ICD-10-CM

## 2021-01-07 DIAGNOSIS — I8001 Phlebitis and thrombophlebitis of superficial vessels of right lower extremity: Secondary | ICD-10-CM | POA: Diagnosis not present

## 2021-01-07 DIAGNOSIS — E785 Hyperlipidemia, unspecified: Secondary | ICD-10-CM | POA: Diagnosis not present

## 2021-01-07 NOTE — Progress Notes (Signed)
MRN : [id number]  Paul Buckley. is a 75 y.o. (07-Sep-1945) male who presents with chief complaint of  Chief Complaint  Patient presents with   Follow-up    Ultrasound follow up  .  History of Present Illness: Patient returns today in follow up of his superficial thrombophlebitis of the right leg.  Within a few days of starting Eliquis, his symptoms markedly improved.  His leg feels much better.  The cord is markedly reduced in size.  No chest pain or shortness of breath.  No problems on the Eliquis.  Duplex today shows complete resolution of the right superficial thrombophlebitis with no DVT present either.  Current Outpatient Medications  Medication Sig Dispense Refill   ELIQUIS 5 MG TABS tablet Take 5 mg by mouth 2 (two) times daily.     losartan (COZAAR) 100 MG tablet Take 1 tablet by mouth daily.     aspirin 81 MG tablet Take 81 mg by mouth once a week.  (Patient not taking: Reported on 01/07/2021)     atorvastatin (LIPITOR) 20 MG tablet Take 20 mg by mouth daily. (Patient not taking: Reported on 01/07/2021)  12   cephALEXin (KEFLEX) 500 MG capsule Take 1 capsule (500 mg total) by mouth 4 (four) times daily. (Patient not taking: Reported on 01/07/2021) 10 capsule 0   lisinopril (PRINIVIL,ZESTRIL) 5 MG tablet Take 5 mg by mouth daily.     ranitidine (ZANTAC) 150 MG capsule Take 150 mg by mouth as needed.     Current Facility-Administered Medications  Medication Dose Route Frequency Provider Last Rate Last Admin   betamethasone acetate-betamethasone sodium phosphate (CELESTONE) injection 3 mg  3 mg Intramuscular Once Daylene Katayama M, DPM       betamethasone acetate-betamethasone sodium phosphate (CELESTONE) injection 3 mg  3 mg Intramuscular Once Daylene Katayama M, DPM       betamethasone acetate-betamethasone sodium phosphate (CELESTONE) injection 3 mg  3 mg Intramuscular Once Edrick Kins, DPM        Past Medical History:  Diagnosis Date   Colon, diverticulosis    GERD  (gastroesophageal reflux disease)    History of colonic polyps    Internal hemorrhoids    Pneumonia     Past Surgical History:  Procedure Laterality Date   APPENDECTOMY  1963   COLONOSCOPY WITH PROPOFOL N/A 04/25/2018   Procedure: COLONOSCOPY WITH PROPOFOL;  Surgeon: Lollie Sails, MD;  Location: [facility] ENDOSCOPY;  Service: Endoscopy;  Laterality: N/A;   EYE SURGERY  2013   HERNIA REPAIR  1986     Social History   Tobacco Use   Smoking status: Former    Years: 50.00    Pack years: 0.00    Types: Cigarettes    Quit date: 07/14/1967    Years since quitting: 53.5   Smokeless tobacco: Never  Vaping Use   Vaping Use: Never used  Substance Use Topics   Alcohol use: Not Currently   Drug use: No      Family History  Problem Relation Age of Onset   Cancer Mother    Cancer Father    Cancer Maternal Uncle    Cancer Paternal Uncle    Heart attack Paternal Grandfather      No Known Allergies    REVIEW OF SYSTEMS (Negative unless checked)   Constitutional: [] Weight loss  [] Fever  [] Chills Cardiac: [] Chest pain   [] Chest pressure   [] Palpitations   [] Shortness of breath when laying flat   [] Shortness of  breath at rest   [] Shortness of breath with exertion. Vascular:  [x] Pain in legs with walking   [x] Pain in legs at rest   [] Pain in legs when laying flat   [] Claudication   [] Pain in feet when walking  [] Pain in feet at rest  [] Pain in feet when laying flat   [] History of DVT   [x] Phlebitis   [x] Swelling in legs   [x] Varicose veins   [] Non-healing ulcers Pulmonary:   [] Uses home oxygen   [] Productive cough   [] Hemoptysis   [] Wheeze  [] COPD   [] Asthma Neurologic:  [] Dizziness  [] Blackouts   [] Seizures   [] History of stroke   [] History of TIA  [] Aphasia   [] Temporary blindness   [] Dysphagia   [] Weakness or numbness in arms   [] Weakness or numbness in legs Musculoskeletal:  [x] Arthritis   [] Joint swelling   [x] Joint pain   [] Low back pain Hematologic:  [] Easy bruising  [] Easy  bleeding   [] Hypercoagulable state   [] Anemic  [] Hepatitis Gastrointestinal:  [] Blood in stool   [] Vomiting blood  [x] Gastroesophageal reflux/heartburn   [] Abdominal pain Genitourinary:  [] Chronic kidney disease   [] Difficult urination  [] Frequent urination  [] Burning with urination   [] Hematuria Skin:  [] Rashes   [] Ulcers   [] Wounds Psychological:  [] History of anxiety   []  History of major depression.  Physical Examination  BP (!) 163/80 (BP Location: Right Arm)   Pulse 71   Resp 16   Wt 237 lb 6.4 oz (107.7 kg)   BMI 32.20 kg/m  Gen:  WD/WN, NAD Head: [place]/AT, No temporalis wasting. Ear/Nose/Throat: Hearing grossly intact, nares w/o erythema or drainage Eyes: Conjunctiva clear. Sclera non-icteric Neck: Supple.  Trachea midline Pulmonary:  Good air movement, no use of accessory muscles.  Cardiac: RRR, no JVD Vascular:  Vessel Right Left  Radial Palpable Palpable                          PT Palpable Palpable  DP Palpable Palpable   Gastrointestinal: soft, non-tender/non-distended. No guarding/reflex.  Musculoskeletal: M/S 5/5 throughout.  No deformity or atrophy.  The previous firm cord in the right thigh and medial knee area is markedly improved with only a small amount of residual tenderness and cord remaining.  No significant edema. Neurologic: Sensation grossly intact in extremities.  Symmetrical.  Speech is fluent.  Psychiatric: Judgment intact, Mood & affect appropriate for pt's clinical situation. Dermatologic: No rashes or ulcers noted.  No cellulitis or open wounds.      Labs No results found for this or any previous visit (from the past 2160 hour(s)).  Radiology No results found.  Assessment/Plan   Essential hypertension blood pressure control important in reducing the progression of atherosclerotic disease. On appropriate oral medications.     Hyperlipidemia lipid control important in reducing the progression of atherosclerotic disease. Continue  statin therapy  Superficial thrombophlebitis Duplex shows resolution of the superficial thrombophlebitis of the right lower extremity with no current DVT or superficial thrombophlebitis present.  He can stop the Eliquis and go back to aspirin daily.  Varicose veins of bilateral lower extremities with other complications Recovering from superficial thrombophlebitis.  At this point, will come back in about 3 months to see what varicosities to remain to determine whether or not any venous intervention is of benefit.    Leotis Pain, MD  01/07/2021 10:37 AM    This note was created with Dragon medical transcription system.  Any errors from dictation are purely  unintentional

## 2021-01-07 NOTE — Assessment & Plan Note (Signed)
Recovering from superficial thrombophlebitis.  At this point, will come back in about 3 months to see what varicosities to remain to determine whether or not any venous intervention is of benefit.

## 2021-01-07 NOTE — Assessment & Plan Note (Signed)
Duplex shows resolution of the superficial thrombophlebitis of the right lower extremity with no current DVT or superficial thrombophlebitis present.  He can stop the Eliquis and go back to aspirin daily.

## 2021-01-08 ENCOUNTER — Encounter: Payer: Self-pay | Admitting: Internal Medicine

## 2021-01-08 ENCOUNTER — Ambulatory Visit (INDEPENDENT_AMBULATORY_CARE_PROVIDER_SITE_OTHER): Payer: Medicare Other | Admitting: Internal Medicine

## 2021-01-08 VITALS — BP 144/85 | HR 70 | Ht 72.0 in | Wt 238.1 lb

## 2021-01-08 DIAGNOSIS — J301 Allergic rhinitis due to pollen: Secondary | ICD-10-CM

## 2021-01-08 DIAGNOSIS — I872 Venous insufficiency (chronic) (peripheral): Secondary | ICD-10-CM | POA: Diagnosis not present

## 2021-01-08 DIAGNOSIS — M720 Palmar fascial fibromatosis [Dupuytren]: Secondary | ICD-10-CM | POA: Diagnosis not present

## 2021-01-08 DIAGNOSIS — E785 Hyperlipidemia, unspecified: Secondary | ICD-10-CM | POA: Diagnosis not present

## 2021-01-08 DIAGNOSIS — I83893 Varicose veins of bilateral lower extremities with other complications: Secondary | ICD-10-CM

## 2021-01-08 DIAGNOSIS — I10 Essential (primary) hypertension: Secondary | ICD-10-CM

## 2021-01-08 NOTE — Assessment & Plan Note (Signed)
Hypercholesterolemia  I advised the patient to follow Mediterranean diet This diet is rich in fruits vegetables and whole grain, and This diet is also rich in fish and lean meat Patient should also eat a handful of almonds or walnuts daily Recent heart study indicated that average follow-up on this kind of diet reduces the cardiovascular mortality by 50 to 70%== 

## 2021-01-08 NOTE — Assessment & Plan Note (Signed)
Take Claritin 5 mg daily

## 2021-01-08 NOTE — Assessment & Plan Note (Signed)

## 2021-01-08 NOTE — Progress Notes (Signed)
Established Patient Office Visit  Subjective:  Patient ID: Paul Ramesh., male    DOB: 06-12-1946  Age: 75 y.o. MRN: [id number]  CC:  Chief Complaint  Patient presents with   Hypertension    3 month follow up    Hypertension   Paul Hews. presents for check up  Past Medical History:  Diagnosis Date   Colon, diverticulosis    GERD (gastroesophageal reflux disease)    History of colonic polyps    Internal hemorrhoids    Pneumonia     Past Surgical History:  Procedure Laterality Date   APPENDECTOMY  1963   COLONOSCOPY WITH PROPOFOL N/A 04/25/2018   Procedure: COLONOSCOPY WITH PROPOFOL;  Surgeon: Lollie Sails, MD;  Location: [facility] ENDOSCOPY;  Service: Endoscopy;  Laterality: N/A;   EYE SURGERY  2013   HERNIA REPAIR  1986    Family History  Problem Relation Age of Onset   Cancer Mother    Cancer Father    Cancer Maternal Uncle    Cancer Paternal Uncle    Heart attack Paternal Grandfather     Social History   Socioeconomic History   Marital status: Married    Spouse name: Not on file   Number of children: Not on file   Years of education: Not on file   Highest education level: Not on file  Occupational History   Not on file  Tobacco Use   Smoking status: Former    Years: 50.00    Pack years: 0.00    Types: Cigarettes    Quit date: 07/14/1967    Years since quitting: 53.5   Smokeless tobacco: Never  Vaping Use   Vaping Use: Never used  Substance and Sexual Activity   Alcohol use: Not Currently   Drug use: No   Sexual activity: Not on file  Other Topics Concern   Not on file  Social History Narrative   Not on file   Social Determinants of Health   Financial Resource Strain: Not on file  Food Insecurity: Not on file  Transportation Needs: Not on file  Physical Activity: Not on file  Stress: Not on file  Social Connections: Not on file  Intimate Partner Violence: Not on file     Current Outpatient Medications:    ELIQUIS 5 MG  TABS tablet, Take 5 mg by mouth 2 (two) times daily., Disp: , Rfl:    lisinopril (PRINIVIL,ZESTRIL) 5 MG tablet, Take 5 mg by mouth daily., Disp: , Rfl:    losartan (COZAAR) 100 MG tablet, Take 1 tablet by mouth daily., Disp: , Rfl:    ranitidine (ZANTAC) 150 MG capsule, Take 150 mg by mouth as needed., Disp: , Rfl:   Current Facility-Administered Medications:    betamethasone acetate-betamethasone sodium phosphate (CELESTONE) injection 3 mg, 3 mg, Intramuscular, Once, Evans, Brent M, DPM   No Known Allergies  ROS Review of Systems  Constitutional: Negative.   HENT: Negative.    Eyes: Negative.   Respiratory: Negative.    Cardiovascular: Negative.   Gastrointestinal: Negative.   Genitourinary: Negative.   Musculoskeletal: Negative.   Skin: Negative.   Allergic/Immunologic: Negative.   Neurological: Negative.   Hematological: Negative.   Psychiatric/Behavioral: Negative.    All other systems reviewed and are negative.    Objective:    Physical Exam Vitals reviewed.  Constitutional:      Appearance: Normal appearance.  HENT:     Mouth/Throat:     Mouth: Mucous membranes are moist.  Eyes:     Pupils: Pupils are equal, round, and reactive to light.  Neck:     Vascular: No carotid bruit.  Cardiovascular:     Rate and Rhythm: Normal rate.     Pulses: Normal pulses.     Heart sounds: Normal heart sounds.  Pulmonary:     Effort: Pulmonary effort is normal.     Breath sounds: Normal breath sounds.  Abdominal:     General: Bowel sounds are normal.     Palpations: Abdomen is soft. There is no hepatomegaly, splenomegaly or mass.     Tenderness: There is no abdominal tenderness.     Hernia: No hernia is present.  Musculoskeletal:     Cervical back: Neck supple.     Right lower leg: No edema.     Left lower leg: No edema.  Skin:    Findings: No rash.  Neurological:     Mental Status: He is alert and oriented to person, place, and time.     Motor: No weakness.   Psychiatric:        Mood and Affect: Mood normal.        Behavior: Behavior normal.    BP (!) 144/85   Pulse 70   Ht 6' (1.829 m)   Wt 238 lb 1.6 oz (108 kg)   BMI 32.29 kg/m  Wt Readings from Last 3 Encounters:  01/08/21 238 lb 1.6 oz (108 kg)  01/07/21 237 lb 6.4 oz (107.7 kg)  12/10/20 234 lb (106.1 kg)     Health Maintenance Due  Topic Date Due   Hepatitis C Screening  Never done   Zoster Vaccines- Shingrix (1 of 2) Never done   PNA vac Low Risk Adult (1 of 2 - PCV13) Never done   COVID-19 Vaccine (4 - Booster for Moderna series) 10/16/2020    There are no preventive care reminders to display for this patient.  Lab Results  Component Value Date   TSH 1.63 05/14/2020   Lab Results  Component Value Date   WBC 7.0 05/14/2020   HGB 13.7 05/14/2020   HCT 41.1 05/14/2020   MCV 97.6 05/14/2020   PLT 278 05/14/2020   Lab Results  Component Value Date   NA 140 05/14/2020   K 4.1 05/14/2020   CO2 27 05/14/2020   GLUCOSE 95 05/14/2020   BUN 19 05/14/2020   CREATININE 1.03 05/14/2020   BILITOT 0.4 05/14/2020   AST 17 05/14/2020   ALT 11 05/14/2020   PROT 6.2 05/14/2020   ALBUMIN 3.8 06/02/2016   CALCIUM 8.8 05/14/2020   ANIONGAP 4 (L) 06/02/2016   Lab Results  Component Value Date   CHOL 111 05/14/2020   Lab Results  Component Value Date   HDL 47 05/14/2020   Lab Results  Component Value Date   LDLCALC 53 05/14/2020   Lab Results  Component Value Date   TRIG 38 05/14/2020   Lab Results  Component Value Date   CHOLHDL 2.4 05/14/2020   No results found for: HGBA1C    Assessment & Plan:   Problem List Items Addressed This Visit       Cardiovascular and Mediastinum   Varicose veins of bilateral lower extremities with other complications - Primary   Essential hypertension     Patient denies any chest pain or shortness of breath there is no history of palpitation or paroxysmal nocturnal dyspnea   patient was advised to follow low-salt  low-cholesterol diet    ideally I want  to keep systolic blood pressure below 130 mmHg, patient was asked to check blood pressure one times a week and give me a report on that.  Patient will be follow-up in 3 months  or earlier as needed, patient will call me back for any change in the cardiovascular symptoms Patient was advised to buy a book from local bookstore concerning blood pressure and read several chapters  every day.  This will be supplemented by some of the material we will give him from the office.  Patient should also utilize other resources like YouTube and Internet to learn more about the blood pressure and the diet.       Chronic venous insufficiency    Patient has seen Dr. Lucky Cowboy         Respiratory   Seasonal allergic rhinitis due to pollen    Take Claritin 5 mg daily         Musculoskeletal and Integument   Dupuytren's disease of palm    Refer to the hand doctor         Other   Hyperlipidemia    Hypercholesterolemia  I advised the patient to follow Mediterranean diet This diet is rich in fruits vegetables and whole grain, and This diet is also rich in fish and lean meat Patient should also eat a handful of almonds or walnuts daily Recent heart study indicated that average follow-up on this kind of diet reduces the cardiovascular mortality by 50 to 70%==        No orders of the defined types were placed in this encounter.   Follow-up: No follow-ups on file.    Cletis [place], MD

## 2021-01-08 NOTE — Assessment & Plan Note (Signed)
Patient has seen Dr. Lucky Cowboy

## 2021-01-08 NOTE — Assessment & Plan Note (Signed)
Refer to the hand doctor

## 2021-01-09 DIAGNOSIS — M5136 Other intervertebral disc degeneration, lumbar region: Secondary | ICD-10-CM | POA: Diagnosis not present

## 2021-01-09 DIAGNOSIS — M9905 Segmental and somatic dysfunction of pelvic region: Secondary | ICD-10-CM | POA: Diagnosis not present

## 2021-01-09 DIAGNOSIS — M955 Acquired deformity of pelvis: Secondary | ICD-10-CM | POA: Diagnosis not present

## 2021-01-09 DIAGNOSIS — M9903 Segmental and somatic dysfunction of lumbar region: Secondary | ICD-10-CM | POA: Diagnosis not present

## 2021-01-20 ENCOUNTER — Encounter: Payer: Self-pay | Admitting: Internal Medicine

## 2021-01-21 ENCOUNTER — Other Ambulatory Visit: Payer: Self-pay

## 2021-01-21 ENCOUNTER — Ambulatory Visit (INDEPENDENT_AMBULATORY_CARE_PROVIDER_SITE_OTHER): Payer: Medicare Other | Admitting: Dermatology

## 2021-01-21 DIAGNOSIS — L259 Unspecified contact dermatitis, unspecified cause: Secondary | ICD-10-CM | POA: Diagnosis not present

## 2021-01-21 MED ORDER — CLOBETASOL PROPIONATE 0.05 % EX CREA: 1.0000 "application " | TOPICAL_CREAM | Freq: Two times a day (BID) | CUTANEOUS | 1 refills | Status: DC

## 2021-01-21 NOTE — Patient Instructions (Signed)
Topical steroids (such as triamcinolone, fluocinolone, fluocinonide, mometasone, clobetasol, halobetasol, betamethasone, hydrocortisone) can cause thinning and lightening of the skin if they are used for too long in the same area. Your physician has selected the right strength medicine for your problem and area affected on the body. Please use your medication only as directed by your physician to prevent side effects.   Avoid applying to face, groin, and axilla. Use as directed. Risk of skin atrophy with long-term use reviewed.   If you have any questions or concerns for your doctor, please call our main line at [phone] and press option 4 to reach your doctor's medical assistant. If no one answers, please leave a voicemail as directed and we will return your call as soon as possible. Messages left after 4 pm will be answered the following business day.   You may also send [place] a message via [place]. We typically respond to MyChart messages within 1-2 business days.  For prescription refills, please ask your pharmacy to contact our office. Our fax number is [phone].  If you have an urgent issue when the clinic is closed that cannot wait until the next business day, you can page your doctor at the number below.    Please note that while we do our best to be available for urgent issues outside of office hours, we are not available 24/7.   If you have an urgent issue and are unable to reach [place], you may choose to seek medical care at your doctor's office, retail clinic, urgent care center, or emergency room.  If you have a medical emergency, please immediately call 911 or go to the emergency department.  Pager Numbers  - Dr. Nehemiah Massed: [phone]  - Dr. Laurence Ferrari: [phone]  - Dr. Nicole Kindred: [phone]  In the event of inclement weather, please call our main line at [phone] for an update on the status of any delays or closures.  Dermatology Medication Tips: Please keep the boxes  that topical medications come in in order to help keep track of the instructions about where and how to use these. Pharmacies typically print the medication instructions only on the boxes and not directly on the medication tubes.   If your medication is too expensive, please contact our office at [phone] option 4 or send [place] a message through [place].   We are unable to tell what your co-pay for medications will be in advance as this is different depending on your insurance coverage. However, we may be able to find a substitute medication at lower cost or fill out paperwork to get insurance to cover a needed medication.   If a prior authorization is required to get your medication covered by your insurance company, please allow [place] 1-2 business days to complete this process.  Drug prices often vary depending on where the prescription is filled and some pharmacies may offer cheaper prices.  The website www.goodrx.com contains coupons for medications through different pharmacies. The prices here do not account for what the cost may be with help from insurance (it may be cheaper with your insurance), but the website can give you the price if you did not use any insurance.  - You can print the associated coupon and take it with your prescription to the pharmacy.  - You may also stop by our office during regular business hours and pick up a GoodRx coupon card.  - If you need your prescription sent electronically to a different pharmacy, notify our office through [facility]  MyChart or by phone at [phone] option 4.

## 2021-01-21 NOTE — Progress Notes (Signed)
   Follow-Up Visit   Subjective  Paul Buckley. is a 75 y.o. male who presents for the following: Follow-up (Patient here today concerning a rash on left lower leg. He stated it started as a small spot on left lower leg 3 week ago but over the last week it has spread.  Patient reports that it itches and was told by primary to use hydrocrotisone cream. The first couple days he used he reports it helped but after it stop helping. He reports he had a spot similar on right knee but it has resolved. Patient denies pet, new lotions, meds, detergents or fragrances. Patient has a new spot on right lower leg near knee.).  He is very allergic to poison ivy  He reports spot at right leg just started today.   The following portions of the chart were reviewed this encounter and updated as appropriate:       Objective  Well appearing patient in no apparent distress; mood and affect are within normal limits.  A focused examination was performed including bilateral lower legs. Relevant physical exam findings are noted in the Assessment and Plan.  left lower leg, right lower leg Scaly erythematous papules and plaques +/- edema and vesiculation.   Assessment & Plan  Contact dermatitis, unspecified contact dermatitis type, unspecified trigger left lower leg, right lower leg  Probable plant exposure/poison ivy  Start clobetasol cream 0.05 % apply to affected areas twice daily until rash clear. Avoid face, groin and axilla.  If not improving or if spreading will consider oral prednisone  Topical steroids (such as triamcinolone, fluocinolone, fluocinonide, mometasone, clobetasol, halobetasol, betamethasone, hydrocortisone) can cause thinning and lightening of the skin if they are used for too long in the same area. Your physician has selected the right strength medicine for your problem and area affected on the body. Please use your medication only as directed by your physician to prevent side effects.     clobetasol cream (TEMOVATE) 0.05 % - left lower leg, right lower leg Apply 1 application topically 2 (two) times daily. Apply to affected areas until rash clear. Avoid face, groin, and axilla.  Return in about 30 days (around 02/20/2021) for rash follow up . I, Ruthell Rummage, CMA, am acting as scribe for Brendolyn Patty, MD.  Documentation: I have reviewed the above documentation for accuracy and completeness, and I agree with the above.  Brendolyn Patty MD

## 2021-01-23 DIAGNOSIS — M9903 Segmental and somatic dysfunction of lumbar region: Secondary | ICD-10-CM | POA: Diagnosis not present

## 2021-01-23 DIAGNOSIS — M955 Acquired deformity of pelvis: Secondary | ICD-10-CM | POA: Diagnosis not present

## 2021-01-23 DIAGNOSIS — M5136 Other intervertebral disc degeneration, lumbar region: Secondary | ICD-10-CM | POA: Diagnosis not present

## 2021-01-23 DIAGNOSIS — M9905 Segmental and somatic dysfunction of pelvic region: Secondary | ICD-10-CM | POA: Diagnosis not present

## 2021-01-25 ENCOUNTER — Emergency Department: Payer: Medicare Other

## 2021-01-25 ENCOUNTER — Emergency Department
Admission: EM | Admit: 2021-01-25 | Discharge: 2021-01-25 | Disposition: A | Discharge status: Home / Self Care | Payer: Medicare Other | Attending: Emergency Medicine | Admitting: Emergency Medicine

## 2021-01-25 ENCOUNTER — Other Ambulatory Visit: Payer: Self-pay

## 2021-01-25 DIAGNOSIS — Z87891 Personal history of nicotine dependence: Secondary | ICD-10-CM | POA: Diagnosis not present

## 2021-01-25 DIAGNOSIS — R000 Tachycardia, unspecified: Secondary | ICD-10-CM | POA: Diagnosis not present

## 2021-01-25 DIAGNOSIS — R52 Pain, unspecified: Secondary | ICD-10-CM | POA: Diagnosis not present

## 2021-01-25 DIAGNOSIS — Z79899 Other long term (current) drug therapy: Secondary | ICD-10-CM | POA: Insufficient documentation

## 2021-01-25 DIAGNOSIS — Z7982 Long term (current) use of aspirin: Secondary | ICD-10-CM | POA: Insufficient documentation

## 2021-01-25 DIAGNOSIS — I10 Essential (primary) hypertension: Secondary | ICD-10-CM | POA: Insufficient documentation

## 2021-01-25 DIAGNOSIS — R079 Chest pain, unspecified: Secondary | ICD-10-CM | POA: Diagnosis not present

## 2021-01-25 DIAGNOSIS — M549 Dorsalgia, unspecified: Secondary | ICD-10-CM | POA: Diagnosis not present

## 2021-01-25 DIAGNOSIS — R0902 Hypoxemia: Secondary | ICD-10-CM | POA: Diagnosis not present

## 2021-01-25 LAB — BASIC METABOLIC PANEL
Anion gap: 8 (ref 5–15)
BUN: 18 mg/dL (ref 8–23)
CO2: 24 mmol/L (ref 22–32)
Calcium: 8.4 mg/dL — ABNORMAL LOW (ref 8.9–10.3)
Chloride: 105 mmol/L (ref 98–111)
Creatinine, Ser: 1.03 mg/dL (ref 0.61–1.24)
GFR, Estimated: >60 mL/min (ref >60)
Glucose, Bld: 113 mg/dL — ABNORMAL HIGH (ref 70–99)
Potassium: 4.6 mmol/L (ref 3.5–5.1)
Sodium: 137 mmol/L (ref 135–145)

## 2021-01-25 LAB — CBC
HCT: 40.8 % (ref 39.0–52.0)
Hemoglobin: 13.5 g/dL (ref 13.0–17.0)
MCH: 31.9 pg (ref 26.0–34.0)
MCHC: 33.1 g/dL (ref 30.0–36.0)
MCV: 96.5 fL (ref 80.0–100.0)
Platelets: 285 10*3/uL (ref 150–400)
RBC: 4.23 MIL/uL (ref 4.22–5.81)
RDW: 13.2 % (ref 11.5–15.5)
WBC: 10.4 10*3/uL (ref 4.0–10.5)
nRBC: 0 % (ref 0.0–0.2)

## 2021-01-25 LAB — HEPATIC FUNCTION PANEL
ALT: <5 U/L (ref 0–44)
AST: 23 U/L (ref 15–41)
Albumin: 3.6 g/dL (ref 3.5–5.0)
Alkaline Phosphatase: 55 U/L (ref 38–126)
Bilirubin, Direct: 0.3 mg/dL — ABNORMAL HIGH (ref 0.0–0.2)
Indirect Bilirubin: 1 mg/dL — ABNORMAL HIGH (ref 0.3–0.9)
Total Bilirubin: 1.3 mg/dL — ABNORMAL HIGH (ref 0.3–1.2)
Total Protein: 7.3 g/dL (ref 6.5–8.1)

## 2021-01-25 LAB — TROPONIN I (HIGH SENSITIVITY)
Troponin I (High Sensitivity): 13 ng/L (ref <18)
Troponin I (High Sensitivity): 11 ng/L (ref <18)

## 2021-01-25 LAB — LIPASE, BLOOD: Lipase: 28 U/L (ref 11–51)

## 2021-01-25 NOTE — ED Notes (Signed)
Pt reports pain has been intermittent. Reports same episode a week previously that pt felt like was due to dehydration and was better after drinking several bottles of water. Pt reports pain tonight did not cease but has subsided from 6/10 to 1/10. Denies SOB, NV, diaphoresis.

## 2021-01-25 NOTE — ED Provider Notes (Signed)
[facility] Emergency Department Provider Note  ____________________________________________   Event Date/Time   First MD Initiated Contact with Patient 01/25/21 0257     (approximate)  I have reviewed the triage vital signs and the nursing notes.   HISTORY  Chief Complaint Chest Pain    HPI Paul Buckley. is a 75 y.o. male who presents for evaluation of chest pain.  He says that starting a couple of hours prior to arrival to the emergency department, he started experiencing left upper aching chest pain that was moderate in intensity.  He states that he thought it might go away if he went to bed but it steadily got worse over time to the point that he called EMS.  He said that after they got there is seem to get better and now it has completely or almost completely gone away to the point he does not even notice it.  He said at no point did he have any shortness of breath, nausea, vomiting, nor sweating.  He denies passing out or nearly passing out.  He denies fever, sore throat, cough, abdominal pain, dysuria.  Nothing in particular made the symptoms better or worse.  He had similar but milder symptoms about 24 hours prior but they went away on their own and it was much milder.  He has not ever had a DVT or pulmonary embolism.  He has no history of heart problems.  He was briefly on Eliquis prescribed by Dr. Lucky Cowboy for a superficial thrombophlebitis but that was a few months ago and he was taken off of it.  He does not smoke.     Past Medical History:  Diagnosis Date   Colon, diverticulosis    GERD (gastroesophageal reflux disease)    History of colonic polyps    Internal hemorrhoids    Pneumonia     Patient Active Problem List   Diagnosis Date Noted   Superficial thrombophlebitis 12/10/2020   Cellulitis of right lower extremity 12/05/2020   Elevated PSA 08/29/2020   Abrasion of right hand 05/22/2020   Annual physical exam 05/22/2020   Seasonal  allergic rhinitis due to pollen 12/01/2019   Herpes zoster without complication 89/38/1017   Pain in right knee 03/07/2019   Stiffness of right knee 03/07/2019   Chronic venous insufficiency 05/17/2018   Right leg pain 05/17/2018   Hyperlipidemia 01/03/2018   Essential hypertension 01/03/2018   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 Authorizing Provider  aspirin EC 81 MG tablet Take 81 mg by mouth daily. Swallow whole.    [provider]  clobetasol cream (TEMOVATE) 7.78 % Apply 1 application topically 2 (two) times daily. Apply to affected areas until rash clear. Avoid face, groin, and axilla. 01/21/21   Brendolyn Patty, MD  ELIQUIS 5 MG TABS tablet Take 5 mg by mouth 2 (two) times daily. Patient not taking: Reported on 01/21/2021 12/10/20   [provider]  lisinopril (PRINIVIL,ZESTRIL) 5 MG tablet Take  5 mg by mouth daily. Patient not taking: Reported on 01/21/2021    [provider]  losartan (COZAAR) 100 MG tablet Take 1 tablet by mouth daily. 10/07/20   [provider]  ranitidine (ZANTAC) 150 MG capsule Take 150 mg by mouth as needed. Patient not taking: Reported on 01/21/2021    [provider]    Allergies Patient has no known allergies.  Family History  Problem Relation Age of Onset   Cancer Mother    Cancer Father    Cancer Maternal Uncle    Cancer Paternal Uncle    Heart attack Paternal Grandfather     Social History Social History    Tobacco Use   Smoking status: Former    Years: 50.00    Types: Cigarettes    Quit date: 07/14/1967    Years since quitting: 53.5   Smokeless tobacco: Never  Vaping Use   Vaping Use: Never used  Substance Use Topics   Alcohol use: Not Currently   Drug use: No    Review of Systems Constitutional: No fever/chills Eyes: No visual changes. ENT: No sore throat. Cardiovascular: Positive for chest pain. Respiratory: Denies shortness of breath. Gastrointestinal: No abdominal pain.  No nausea, no vomiting.  No diarrhea.  No constipation. Genitourinary: Negative for dysuria. Musculoskeletal: Negative for neck pain.  Negative for back pain. Integumentary: Negative for rash. Neurological: Negative for headaches, focal weakness or numbness.   ____________________________________________   PHYSICAL EXAM:  VITAL SIGNS: ED Triage Vitals  Enc Vitals Group     BP 01/25/21 0215 129/71     Pulse Rate 01/25/21 0215 (!) 113     Resp 01/25/21 0215 18     Temp 01/25/21 0215 98.5 F (36.9 C)     Temp Source 01/25/21 0215 Oral     SpO2 01/25/21 0215 94 %     Weight 01/25/21 0218 108 kg (238 lb 1.6 oz)     Height 01/25/21 0218 1.829 m (6')     Head Circumference --      Peak Flow --      Pain Score 01/25/21 0218 6     Pain Loc --      Pain Edu? --      Excl. in [place]? --     Constitutional: Alert and oriented.  Eyes: Conjunctivae are normal.  Head: Atraumatic. Nose: No congestion/rhinnorhea. Mouth/Throat: Patient is wearing a mask. Neck: No stridor.  No meningeal signs.   Cardiovascular: Normal rate, regular rhythm. Good peripheral circulation. Respiratory: Normal respiratory effort.  No retractions. Gastrointestinal: Soft and nontender. No distention.  Musculoskeletal: No lower extremity tenderness nor edema. No gross deformities of extremities. Neurologic:  Normal speech and language. No gross focal neurologic deficits are appreciated.  Skin:  Skin is warm, dry and  intact. Psychiatric: Mood and affect are normal. Speech and behavior are normal.  ____________________________________________   LABS (all labs ordered are listed, but only abnormal results are displayed)  Labs Reviewed  BASIC METABOLIC PANEL - Abnormal; Notable for the following components:      Result Value   Glucose, Bld 113 (*)    Calcium 8.4 (*)    All other components within normal limits  HEPATIC FUNCTION PANEL - Abnormal; Notable for the following components:   Total Bilirubin 1.3 (*)    Bilirubin, Direct 0.3 (*)    Indirect Bilirubin 1.0 (*)    All other components within normal limits  CBC  LIPASE, BLOOD  TROPONIN I (HIGH SENSITIVITY)  TROPONIN  I (HIGH SENSITIVITY)   ____________________________________________  EKG  ED ECG REPORT I, Hinda Kehr, the attending physician, personally viewed and interpreted this ECG.  Date: 01/25/2021 EKG Time: 2:19 Rate: 111 Rhythm: sinus tachycardia QRS Axis: normal Intervals: normal ST/T Wave abnormalities: normal Narrative Interpretation: no evidence of acute ischemia  ____________________________________________  RADIOLOGY I, Hinda Kehr, personally viewed and evaluated these images (plain radiographs) as part of my medical decision making, as well as reviewing the written report by the radiologist.  ED MD interpretation: No acute abnormalities on chest x-ray.  Official radiology report(s): DG Chest 2 View  Result Date: 01/25/2021 CLINICAL DATA:  Chest pain EXAM: CHEST - 2 VIEW COMPARISON:  CT chest dated 08/15/2014. FINDINGS: Mild bibasilar scarring/atelectasis. No pleural effusion or pneumothorax. The heart is normal in size. Degenerative changes of the visualized thoracolumbar spine. IMPRESSION: Mild bibasilar scarring/atelectasis. Electronically Signed   By: Julian Hy M.D.   On: 01/25/2021 03:30    ____________________________________________   PROCEDURES   Procedure(s) performed (including Critical  Care):  Procedures   ____________________________________________   INITIAL IMPRESSION / MDM / [place] / ED COURSE  As part of my medical decision making, I reviewed the following data within the [place] notes reviewed and incorporated, Labs reviewed , EKG interpreted , Old chart reviewed, Radiograph reviewed , and Notes from prior ED visits   Differential diagnosis includes, but is not limited to, angina, ACS, musculoskeletal strain, anxiety, acid reflux, biliary colic, PE, pneumonia.  The patient is on the cardiac monitor to evaluate for evidence of arrhythmia and/or significant heart rate changes.  EKG is reassuring and nonischemic.  Initially he has some tachycardia but that has completely resolved and I suspect it was due to both anxiety when he arrived in due to the pain which has also resolved.  His vital signs are stable with a good blood pressure and a heart rate now of about 80.  No respiratory symptoms and normal SPO2.  He has a well score for PE of 0 now that his tachycardia has resolved and he is low to medium risk for ACS based on HEAR score.   I personally reviewed the patient's imaging and agree with the radiologist's interpretation that there are no acute abnormalities on chest x-ray.  CBC and initial high-sensitivity troponin are within normal limits.  BNP is also essentially normal.  I added on hepatic function and lipase but I think it is unlikely to be positive given that he has no tenderness to palpation of the epigastrium nor right upper quadrant.  Patient is comfortable with the plan for repeat troponin and reassessment.     Clinical Course as of 01/25/21 0528  Sat Jan 25, 2021  3716 Patient has not had any additional pain since coming to the emergency department.  His repeat troponin is negative.  I talked with him about staying in the hospital versus going home for outpatient follow-up and he very much does not want to stay.   He will follow-up with his PCP but I am also providing information for cardiology with whom he can schedule a follow-up appointment.  I gave my usual and customary return precautions. [CF]    Clinical Course User Index [CF] Hinda Kehr, MD     ____________________________________________  FINAL CLINICAL IMPRESSION(S) / ED DIAGNOSES  Final diagnoses:  Chest pain, unspecified type     MEDICATIONS GIVEN DURING THIS VISIT:  Medications - No data to display   ED Discharge Orders  None        Note:  This document was prepared using Dragon voice recognition software and may include unintentional dictation errors.   Hinda Kehr, MD 01/25/21 [phone]

## 2021-01-25 NOTE — Discharge Instructions (Signed)

## 2021-01-25 NOTE — ED Triage Notes (Signed)
Pt with back pain that began at 2230 last pm. Pt then began to experience chest pain approx 15 min after. Pt denies shob, nausea, vomiting, dizziness.

## 2021-01-30 ENCOUNTER — Other Ambulatory Visit: Payer: Self-pay

## 2021-01-30 ENCOUNTER — Ambulatory Visit (INDEPENDENT_AMBULATORY_CARE_PROVIDER_SITE_OTHER): Payer: Medicare Other | Admitting: Internal Medicine

## 2021-01-30 ENCOUNTER — Encounter: Payer: Self-pay | Admitting: Internal Medicine

## 2021-01-30 VITALS — BP 155/79 | HR 74 | Ht 72.0 in | Wt 238.1 lb

## 2021-01-30 DIAGNOSIS — I10 Essential (primary) hypertension: Secondary | ICD-10-CM

## 2021-01-30 DIAGNOSIS — R972 Elevated prostate specific antigen [PSA]: Secondary | ICD-10-CM | POA: Diagnosis not present

## 2021-01-30 DIAGNOSIS — R072 Precordial pain: Secondary | ICD-10-CM | POA: Insufficient documentation

## 2021-01-30 DIAGNOSIS — I83893 Varicose veins of bilateral lower extremities with other complications: Secondary | ICD-10-CM

## 2021-01-30 DIAGNOSIS — E785 Hyperlipidemia, unspecified: Secondary | ICD-10-CM | POA: Diagnosis not present

## 2021-01-30 NOTE — Assessment & Plan Note (Signed)
Patient was seen with atypical and upper back pain which is atypical He did not pass out or sweating but no nausea vomiting. We will schedule the patient for echocardiogram and a stress test. He was advised to lose weight. He was told to go to the hospital if chest pain gets worse.

## 2021-01-30 NOTE — Assessment & Plan Note (Signed)

## 2021-01-30 NOTE — Assessment & Plan Note (Signed)
Hypercholesterolemia  I advised the patient to follow Mediterranean diet This diet is rich in fruits vegetables and whole grain, and This diet is also rich in fish and lean meat Patient should also eat a handful of almonds or walnuts daily Recent heart study indicated that average follow-up on this kind of diet reduces the cardiovascular mortality by 50 to 70%== 

## 2021-01-30 NOTE — Assessment & Plan Note (Signed)
Varicose veins are stable there is no evidence of phlebitis

## 2021-01-30 NOTE — Progress Notes (Signed)
Established Patient Office Visit  Subjective:  Patient ID: Paul Wenke., male    DOB: Jul 04, 1946  Age: 75 y.o. MRN: [id number]  CC:  Chief Complaint  Patient presents with   Follow-up    Patient was seen at ED on 01/25/21 with chest pain     HPI  Paul Buckley. presents for evaluation of the chest discomfort, radiated to the back of the leg shoulder area does not go too long.  He was seen in the emergency room and was kept there for 6 hours.  Troponin was negative.  Chest x-ray was unremarkable.  Liver function tests were normal.  Lipase was okay.  Patient does not drink.  He denies any history of chest pain with exertion.  He does not smoke does not drink.  Past Medical History:  Diagnosis Date   Colon, diverticulosis    GERD (gastroesophageal reflux disease)    History of colonic polyps    Internal hemorrhoids    Pneumonia     Past Surgical History:  Procedure Laterality Date   APPENDECTOMY  1963   COLONOSCOPY WITH PROPOFOL N/A 04/25/2018   Procedure: COLONOSCOPY WITH PROPOFOL;  Surgeon: Lollie Sails, MD;  Location[facility] ENDOSCOPY;  Service: Endoscopy;  Laterality: N/A;   EYE SURGERY  2013   HERNIA REPAIR  1986    Family History  Problem Relation Age of Onset   Cancer Mother    Cancer Father    Cancer Maternal Uncle    Cancer Paternal Uncle    Heart attack Paternal Grandfather     Social History   Socioeconomic History   Marital status: Married    Spouse name: Not on file   Number of children: Not on file   Years of education: Not on file   Highest education level: Not on file  Occupational History   Not on file  Tobacco Use   Smoking status: Former    Years: 50.00    Types: Cigarettes    Quit date: 07/14/1967    Years since quitting: 53.5   Smokeless tobacco: Never  Vaping Use   Vaping Use: Never used  Substance and Sexual Activity   Alcohol use: Not Currently   Drug use: No   Sexual activity: Not on file  Other Topics Concern   Not  on file  Social History Narrative   Not on file   Social Determinants of Health   Financial Resource Strain: Not on file  Food Insecurity: Not on file  Transportation Needs: Not on file  Physical Activity: Not on file  Stress: Not on file  Social Connections: Not on file  Intimate Partner Violence: Not on file     Current Outpatient Medications:    aspirin EC 81 MG tablet, Take 81 mg by mouth daily. Swallow whole., Disp: , Rfl:    clobetasol cream (TEMOVATE) 0.93 %, Apply 1 application topically 2 (two) times daily. Apply to affected areas until rash clear. Avoid face, groin, and axilla., Disp: 60 g, Rfl: 1   ELIQUIS 5 MG TABS tablet, Take 5 mg by mouth 2 (two) times daily. (Patient not taking: Reported on 01/21/2021), Disp: , Rfl:    lisinopril (PRINIVIL,ZESTRIL) 5 MG tablet, Take 5 mg by mouth daily. (Patient not taking: Reported on 01/21/2021), Disp: , Rfl:    losartan (COZAAR) 100 MG tablet, Take 1 tablet by mouth daily., Disp: , Rfl:    ranitidine (ZANTAC) 150 MG capsule, Take 150 mg by mouth as needed. (  Patient not taking: Reported on 01/21/2021), Disp: , Rfl:   Current Facility-Administered Medications:    betamethasone acetate-betamethasone sodium phosphate (CELESTONE) injection 3 mg, 3 mg, Intramuscular, Once, Evans, Brent M, DPM   No Known Allergies  ROS Review of Systems  Constitutional: Negative.   HENT: Negative.    Eyes: Negative.   Respiratory: Negative.    Cardiovascular: Negative.   Gastrointestinal: Negative.   Endocrine: Negative.   Genitourinary: Negative.   Musculoskeletal: Negative.   Skin: Negative.   Allergic/Immunologic: Negative.   Neurological: Negative.   Hematological: Negative.   Psychiatric/Behavioral: Negative.    All other systems reviewed and are negative.    Objective:    Physical Exam Vitals reviewed.  Constitutional:      Appearance: Normal appearance.  HENT:     Mouth/Throat:     Mouth: Mucous membranes are moist.  Eyes:      Pupils: Pupils are equal, round, and reactive to light.  Neck:     Vascular: No carotid bruit.  Cardiovascular:     Rate and Rhythm: Normal rate and regular rhythm.     Pulses: Normal pulses.     Heart sounds: Normal heart sounds.  Pulmonary:     Effort: Pulmonary effort is normal.     Breath sounds: Normal breath sounds.  Abdominal:     General: Bowel sounds are normal.     Palpations: Abdomen is soft. There is no hepatomegaly, splenomegaly or mass.     Tenderness: There is no abdominal tenderness.     Hernia: No hernia is present.  Musculoskeletal:     Cervical back: Neck supple.     Right lower leg: No edema.     Left lower leg: No edema.  Skin:    Findings: No rash.  Neurological:     Mental Status: He is alert and oriented to person, place, and time.     Motor: No weakness.  Psychiatric:        Mood and Affect: Mood normal.        Behavior: Behavior normal.    BP (!) 155/79   Pulse 74   Ht 6' (1.829 m)   BMI 32.29 kg/m  Wt Readings from Last 3 Encounters:  01/25/21 238 lb 1.6 oz (108 kg)  01/08/21 238 lb 1.6 oz (108 kg)  01/07/21 237 lb 6.4 oz (107.7 kg)     Health Maintenance Due  Topic Date Due   Hepatitis C Screening  Never done   PNA vac Low Risk Adult (1 of 2 - PCV13) Never done   COVID-19 Vaccine (4 - Booster for Moderna series) 10/16/2020    There are no preventive care reminders to display for this patient.  Lab Results  Component Value Date   TSH 1.63 05/14/2020   Lab Results  Component Value Date   WBC 10.4 01/25/2021   HGB 13.5 01/25/2021   HCT 40.8 01/25/2021   MCV 96.5 01/25/2021   PLT 285 01/25/2021   Lab Results  Component Value Date   NA 137 01/25/2021   K 4.6 01/25/2021   CO2 24 01/25/2021   GLUCOSE 113 (H) 01/25/2021   BUN 18 01/25/2021   CREATININE 1.03 01/25/2021   BILITOT 1.3 (H) 01/25/2021   ALKPHOS 55 01/25/2021   AST 23 01/25/2021   ALT <5 01/25/2021   PROT 7.3 01/25/2021   ALBUMIN 3.6 01/25/2021   CALCIUM 8.4  (L) 01/25/2021   ANIONGAP 8 01/25/2021   Lab Results  Component Value Date  CHOL 111 05/14/2020   Lab Results  Component Value Date   HDL 47 05/14/2020   Lab Results  Component Value Date   LDLCALC 53 05/14/2020   Lab Results  Component Value Date   TRIG 38 05/14/2020   Lab Results  Component Value Date   CHOLHDL 2.4 05/14/2020   No results found for: HGBA1C    Assessment & Plan:   Problem List Items Addressed This Visit       Cardiovascular and Mediastinum   Varicose veins of bilateral lower extremities with other complications - Primary    Varicose veins are stable there is no evidence of phlebitis       Essential hypertension     Patient denies any chest pain or shortness of breath there is no history of palpitation or paroxysmal nocturnal dyspnea   patient was advised to follow low-salt low-cholesterol diet    ideally I want to keep systolic blood pressure below 130 mmHg, patient was asked to check blood pressure one times a week and give me a report on that.  Patient will be follow-up in 3 months  or earlier as needed, patient will call me back for any change in the cardiovascular symptoms Patient was advised to buy a book from local bookstore concerning blood pressure and read several chapters  every day.  This will be supplemented by some of the material we will give him from the office.  Patient should also utilize other resources like YouTube and Internet to learn more about the blood pressure and the diet.         Other   Hyperlipidemia    Hypercholesterolemia  I advised the patient to follow Mediterranean diet This diet is rich in fruits vegetables and whole grain, and This diet is also rich in fish and lean meat Patient should also eat a handful of almonds or walnuts daily Recent heart study indicated that average follow-up on this kind of diet reduces the cardiovascular mortality by 50 to 70%==       Elevated PSA    Referred to [facility]        Precordial pain    Patient was seen with atypical and upper back pain which is atypical He did not pass out or sweating but no nausea vomiting. We will schedule the patient for echocardiogram and a stress test. He was advised to lose weight. He was told to go to the hospital if chest pain gets worse.        No orders of the defined types were placed in this encounter.   Follow-up: No follow-ups on file.  Patient will be scheduled to have an echocardiogram and a stress test  Cletis Athens, MD

## 2021-01-30 NOTE — Assessment & Plan Note (Signed)
Referred to [facility] 

## 2021-02-04 ENCOUNTER — Other Ambulatory Visit (INDEPENDENT_AMBULATORY_CARE_PROVIDER_SITE_OTHER): Payer: Medicare Other

## 2021-02-04 ENCOUNTER — Other Ambulatory Visit: Payer: Self-pay

## 2021-02-04 DIAGNOSIS — I10 Essential (primary) hypertension: Secondary | ICD-10-CM | POA: Diagnosis not present

## 2021-02-04 DIAGNOSIS — I872 Venous insufficiency (chronic) (peripheral): Secondary | ICD-10-CM | POA: Diagnosis not present

## 2021-02-04 DIAGNOSIS — R072 Precordial pain: Secondary | ICD-10-CM | POA: Diagnosis not present

## 2021-02-04 NOTE — Assessment & Plan Note (Signed)
Patient has been evaluated by vascular specialist No swelling was noted

## 2021-02-04 NOTE — Assessment & Plan Note (Signed)
Blood pressure is stable at the present time 

## 2021-02-04 NOTE — Assessment & Plan Note (Signed)
We will perform an echocardiogram

## 2021-02-04 NOTE — Progress Notes (Unsigned)
Established Patient Office Visit  Subjective:  Patient ID: Paul Drouhard., male    DOB: Mar 08, 1946  Age: 75 y.o. MRN: [id number]  CC:  No chief complaint on file.   HPI  Paul Schwed. came in for evaluation of the chest discomfort, radiated to the back of the leg shoulder area does not go too long.  He was seen in the emergency room and was kept there for 6 hours.  Troponin was negative.   Chest x-ray was unremarkable.  Liver function tests were normal.  Lipase was okay.  Patient does not drink.  He denies any history of chest pain with exertion.    Past Medical History:  Diagnosis Date   Colon, diverticulosis    GERD (gastroesophageal reflux disease)    History of colonic polyps    Internal hemorrhoids    Pneumonia     Past Surgical History:  Procedure Laterality Date   APPENDECTOMY  1963   COLONOSCOPY WITH PROPOFOL N/A 04/25/2018   Procedure: COLONOSCOPY WITH PROPOFOL;  Surgeon: Lollie Sails, MD;  Location: [facility] ENDOSCOPY;  Service: Endoscopy;  Laterality: N/A;   EYE SURGERY  2013   HERNIA REPAIR  1986    Family History  Problem Relation Age of Onset   Cancer Mother    Cancer Father    Cancer Maternal Uncle    Cancer Paternal Uncle    Heart attack Paternal Grandfather     Social History   Socioeconomic History   Marital status: Married    Spouse name: Not on file   Number of children: Not on file   Years of education: Not on file   Highest education level: Not on file  Occupational History   Not on file  Tobacco Use   Smoking status: Former    Years: 50.00    Types: Cigarettes    Quit date: 07/14/1967    Years since quitting: 53.6   Smokeless tobacco: Never  Vaping Use   Vaping Use: Never used  Substance and Sexual Activity   Alcohol use: Not Currently   Drug use: No   Sexual activity: Not on file  Other Topics Concern   Not on file  Social History Narrative   Not on file   Social Determinants of Health   Financial Resource Strain:  Not on file  Food Insecurity: Not on file  Transportation Needs: Not on file  Physical Activity: Not on file  Stress: Not on file  Social Connections: Not on file  Intimate Partner Violence: Not on file     Current Outpatient Medications:    aspirin EC 81 MG tablet, Take 81 mg by mouth daily. Swallow whole., Disp: , Rfl:    clobetasol cream (TEMOVATE) AB-123456789 %, Apply 1 application topically 2 (two) times daily. Apply to affected areas until rash clear. Avoid face, groin, and axilla., Disp: 60 g, Rfl: 1   ELIQUIS 5 MG TABS tablet, Take 5 mg by mouth 2 (two) times daily. (Patient not taking: Reported on 01/21/2021), Disp: , Rfl:    lisinopril (PRINIVIL,ZESTRIL) 5 MG tablet, Take 5 mg by mouth daily. (Patient not taking: Reported on 01/21/2021), Disp: , Rfl:    losartan (COZAAR) 100 MG tablet, Take 1 tablet by mouth daily., Disp: , Rfl:    ranitidine (ZANTAC) 150 MG capsule, Take 150 mg by mouth as needed. (Patient not taking: Reported on 01/21/2021), Disp: , Rfl:   Current Facility-Administered Medications:    betamethasone acetate-betamethasone sodium phosphate (CELESTONE) injection  3 mg, 3 mg, Intramuscular, Once, Evans, Brent M, DPM   No Known Allergies  ROS Review of Systems  Constitutional: Negative.   HENT: Negative.    Eyes: Negative.   Respiratory: Negative.  Negative for shortness of breath.   Cardiovascular:  Positive for chest pain. Negative for palpitations and leg swelling.  Gastrointestinal: Negative.   Endocrine: Negative.   Genitourinary: Negative.   Musculoskeletal: Negative.   Skin: Negative.   Allergic/Immunologic: Negative.   Neurological: Negative.   Hematological: Negative.   Psychiatric/Behavioral: Negative.    All other systems reviewed and are negative.    Objective:    Physical Exam Vitals reviewed.  Constitutional:      Appearance: Normal appearance.  HENT:     Mouth/Throat:     Mouth: Mucous membranes are moist.  Eyes:     Pupils: Pupils are  equal, round, and reactive to light.  Neck:     Vascular: No carotid bruit.  Cardiovascular:     Rate and Rhythm: Normal rate and regular rhythm.     Pulses: Normal pulses.     Heart sounds: Normal heart sounds.  Pulmonary:     Effort: Pulmonary effort is normal.     Breath sounds: Normal breath sounds.  Abdominal:     General: Bowel sounds are normal.     Palpations: Abdomen is soft. There is no hepatomegaly, splenomegaly or mass.     Tenderness: There is no abdominal tenderness.     Hernia: No hernia is present.  Musculoskeletal:        General: Normal range of motion.     Cervical back: Neck supple.     Right lower leg: No edema.     Left lower leg: No edema.  Skin:    General: Skin is warm.     Findings: No rash.  Neurological:     Mental Status: He is alert and oriented to person, place, and time.     Gait: Gait normal.  Psychiatric:        Mood and Affect: Mood normal.        Behavior: Behavior normal.    There were no vitals taken for this visit. Wt Readings from Last 3 Encounters:  01/30/21 238 lb 1.6 oz (108 kg)  01/25/21 238 lb 1.6 oz (108 kg)  01/08/21 238 lb 1.6 oz (108 kg)     Health Maintenance Due  Topic Date Due   Hepatitis C Screening  Never done   PNA vac Low Risk Adult (1 of 2 - PCV13) Never done   COVID-19 Vaccine (4 - Booster for Moderna series) 10/16/2020    There are no preventive care reminders to display for this patient.  Lab Results  Component Value Date   TSH 1.63 05/14/2020   Lab Results  Component Value Date   WBC 10.4 01/25/2021   HGB 13.5 01/25/2021   HCT 40.8 01/25/2021   MCV 96.5 01/25/2021   PLT 285 01/25/2021   Lab Results  Component Value Date   NA 137 01/25/2021   K 4.6 01/25/2021   CO2 24 01/25/2021   GLUCOSE 113 (H) 01/25/2021   BUN 18 01/25/2021   CREATININE 1.03 01/25/2021   BILITOT 1.3 (H) 01/25/2021   ALKPHOS 55 01/25/2021   AST 23 01/25/2021   ALT <5 01/25/2021   PROT 7.3 01/25/2021   ALBUMIN 3.6  01/25/2021   CALCIUM 8.4 (L) 01/25/2021   ANIONGAP 8 01/25/2021   Lab Results  Component Value Date  CHOL 111 05/14/2020   Lab Results  Component Value Date   HDL 47 05/14/2020   Lab Results  Component Value Date   LDLCALC 53 05/14/2020   Lab Results  Component Value Date   TRIG 38 05/14/2020   Lab Results  Component Value Date   CHOLHDL 2.4 05/14/2020   No results found for: HGBA1C    Assessment & Plan:   Problem List Items Addressed This Visit       Cardiovascular and Mediastinum   Essential hypertension    Blood pressure is stable at the present time       Chronic venous insufficiency    Patient has been evaluated by vascular specialist No swelling was noted         Other   Precordial pain    We will perform an echocardiogram       [facility] [place], [place] [address] Phone: [phone] Fax:  [phone]  Transthoracic Echocardiogram Note  Paul Buckley [id number] 1945/12/28  Procedure: Transthoracic Echocardiogram Indications: Chest pain Verbal Consent: Obtained  Procedure Details   Technical quality: good  Resting Measurements: Within normal limits  Left Ventrical: Size is normal  Mitral Valve: Anterior and posterior mitral leaflets are normal  Aortic Valve: Aortic valve does not show any stenosis  Tricuspid Valve: Tricuspid valve is normal  Pulmonic Valve: Not seen  Left Atrium/ Left atrial appendage: Normal size  Atrial septum:   Aorta: Root is normal   Complications: No apparent complications Patient did tolerate procedure well.  Cletis Athens, MD   Follow-up: No follow-ups on file.  Patient will be scheduled to have stress test  Cletis Athens, MD

## 2021-02-05 ENCOUNTER — Other Ambulatory Visit: Payer: Self-pay

## 2021-02-05 DIAGNOSIS — L259 Unspecified contact dermatitis, unspecified cause: Secondary | ICD-10-CM

## 2021-02-05 NOTE — Progress Notes (Signed)
Patient called and left voicemail unable to fill Clobetasol. Called patient back and pharmacist was able to run medication through insurance.

## 2021-02-06 DIAGNOSIS — M5136 Other intervertebral disc degeneration, lumbar region: Secondary | ICD-10-CM | POA: Diagnosis not present

## 2021-02-06 DIAGNOSIS — M955 Acquired deformity of pelvis: Secondary | ICD-10-CM | POA: Diagnosis not present

## 2021-02-06 DIAGNOSIS — M9903 Segmental and somatic dysfunction of lumbar region: Secondary | ICD-10-CM | POA: Diagnosis not present

## 2021-02-06 DIAGNOSIS — M9905 Segmental and somatic dysfunction of pelvic region: Secondary | ICD-10-CM | POA: Diagnosis not present

## 2021-02-10 ENCOUNTER — Other Ambulatory Visit (INDEPENDENT_AMBULATORY_CARE_PROVIDER_SITE_OTHER): Payer: Medicare Other | Admitting: Internal Medicine

## 2021-02-10 ENCOUNTER — Other Ambulatory Visit: Payer: Self-pay

## 2021-02-10 DIAGNOSIS — I83893 Varicose veins of bilateral lower extremities with other complications: Secondary | ICD-10-CM | POA: Diagnosis not present

## 2021-02-10 DIAGNOSIS — I10 Essential (primary) hypertension: Secondary | ICD-10-CM

## 2021-02-10 DIAGNOSIS — I872 Venous insufficiency (chronic) (peripheral): Secondary | ICD-10-CM | POA: Diagnosis not present

## 2021-02-10 DIAGNOSIS — J301 Allergic rhinitis due to pollen: Secondary | ICD-10-CM

## 2021-02-10 DIAGNOSIS — M720 Palmar fascial fibromatosis [Dupuytren]: Secondary | ICD-10-CM

## 2021-02-10 DIAGNOSIS — E785 Hyperlipidemia, unspecified: Secondary | ICD-10-CM | POA: Diagnosis not present

## 2021-02-10 NOTE — Progress Notes (Unsigned)
New Patient Office Visit  Subjective:  Patient ID: Paul Buckley., male    DOB: [birth year]  Age: 75 y.o. MRN: [id number]  CC: No chief complaint on file.   HPI Patient presentsfor stress test Past Medical History:  Diagnosis Date   Colon, diverticulosis    GERD (gastroesophageal reflux disease)    History of colonic polyps    Internal hemorrhoids    Pneumonia      Current Outpatient Medications:    aspirin EC 81 MG tablet, Take 81 mg by mouth daily. Swallow whole., Disp: , Rfl:    clobetasol cream (TEMOVATE) AB-123456789 %, Apply 1 application topically 2 (two) times daily. Apply to affected areas until rash clear. Avoid face, groin, and axilla., Disp: 60 g, Rfl: 1   ELIQUIS 5 MG TABS tablet, Take 5 mg by mouth 2 (two) times daily. (Patient not taking: Reported on 01/21/2021), Disp: , Rfl:    lisinopril (PRINIVIL,ZESTRIL) 5 MG tablet, Take 5 mg by mouth daily. (Patient not taking: Reported on 01/21/2021), Disp: , Rfl:    losartan (COZAAR) 100 MG tablet, Take 1 tablet by mouth daily., Disp: , Rfl:    ranitidine (ZANTAC) 150 MG capsule, Take 150 mg by mouth as needed. (Patient not taking: Reported on 01/21/2021), Disp: , Rfl:   Current Facility-Administered Medications:    betamethasone acetate-betamethasone sodium phosphate (CELESTONE) injection 3 mg, 3 mg, Intramuscular, Once, Edrick Kins, DPM   Past Surgical History:  Procedure Laterality Date   APPENDECTOMY  1963   COLONOSCOPY WITH PROPOFOL N/A 04/25/2018   Procedure: COLONOSCOPY WITH PROPOFOL;  Surgeon: Lollie Sails, MD;  Location: [facility] ENDOSCOPY;  Service: Endoscopy;  Laterality: N/A;   EYE SURGERY  2013   HERNIA REPAIR  1986    Family History  Problem Relation Age of Onset   Cancer Mother    Cancer Father    Cancer Maternal Uncle    Cancer Paternal Uncle    Heart attack Paternal Grandfather     Social History   Socioeconomic History   Marital status: Married    Spouse name: Not on file   Number of  children: Not on file   Years of education: Not on file   Highest education level: Not on file  Occupational History   Not on file  Tobacco Use   Smoking status: Former    Years: 50.00    Types: Cigarettes    Quit date: 07/14/1967    Years since quitting: 53.6   Smokeless tobacco: Never  Vaping Use   Vaping Use: Never used  Substance and Sexual Activity   Alcohol use: Not Currently   Drug use: No   Sexual activity: Not on file  Other Topics Concern   Not on file  Social History Narrative   Not on file   Social Determinants of Health   Financial Resource Strain: Not on file  Food Insecurity: Not on file  Transportation Needs: Not on file  Physical Activity: Not on file  Stress: Not on file  Social Connections: Not on file  Intimate Partner Violence: Not on file    ROS Review of Systems  Constitutional: Negative.   HENT: Negative.    Eyes: Negative.   Respiratory: Negative.    Cardiovascular: Negative.   Gastrointestinal: Negative.   Endocrine: Negative.   Genitourinary: Negative.   Musculoskeletal: Negative.   Skin: Negative.   Allergic/Immunologic: Negative.   Neurological: Negative.   Hematological: Negative.   Psychiatric/Behavioral: Negative.    All  other systems reviewed and are negative.  Objective:   Today's Vitals: There were no vitals taken for this visit.  Physical Exam Cardiovascular:     Rate and Rhythm: Normal rate.     Pulses:          Carotid pulses are 1+ on the right side and 1+ on the left side.      Radial pulses are 1+ on the right side and 1+ on the left side.       Femoral pulses are 1+ on the right side and 1+ on the left side.      Popliteal pulses are 1+ on the right side and 1+ on the left side.       Dorsalis pedis pulses are 1+ on the right side and 1+ on the left side.       Posterior tibial pulses are 1+ on the right side and 1+ on the left side.     Heart sounds: Normal heart sounds, S1 normal and S2 normal.  Pulmonary:      Effort: Pulmonary effort is normal.     Breath sounds: Normal breath sounds.  Chest:  Breasts:    Right: Normal.     Left: Normal.  Abdominal:     General: Abdomen is protuberant.     Palpations: Abdomen is soft. There is no hepatomegaly.     Comments: Varicose veins of both legs  Musculoskeletal:     Right lower leg: No edema.     Left lower leg: No edema.    Assessment & Plan:   Problem List Items Addressed This Visit       Cardiovascular and Mediastinum   Varicose veins of bilateral lower extremities with other complications - Primary    Patient was advised to wear stockings       Essential hypertension     Patient denies any chest pain or shortness of breath there is no history of palpitation or paroxysmal nocturnal dyspnea   patient was advised to follow low-salt low-cholesterol diet    ideally I want to keep systolic blood pressure below 130 mmHg, patient was asked to check blood pressure one times a week and give me a report on that.  Patient will be follow-up in 3 months  or earlier as needed, patient will call me back for any change in the cardiovascular symptoms Patient was advised to buy a book from local bookstore concerning blood pressure and read several chapters  every day.  This will be supplemented by some of the material we will give him from the office.  Patient should also utilize other resources like YouTube and Internet to learn more about the blood pressure and the diet.       Chronic venous insufficiency    Use support hose         Respiratory   Seasonal allergic rhinitis due to pollen    Take Claritin 5 mg daily         Musculoskeletal and Integument   Dupuytren's disease of palm    Refer to orthopedics         Other   Hyperlipidemia    Hypercholesterolemia  I advised the patient to follow Mediterranean diet This diet is rich in fruits vegetables and whole grain, and This diet is also rich in fish and lean meat Patient should  also eat a handful of almonds or walnuts daily Recent heart study indicated that average follow-up on this kind of diet reduces the cardiovascular  mortality by 50 to 70%==        Outpatient Encounter Medications as of 02/10/2021  Medication Sig   aspirin EC 81 MG tablet Take 81 mg by mouth daily. Swallow whole.   clobetasol cream (TEMOVATE) AB-123456789 % Apply 1 application topically 2 (two) times daily. Apply to affected areas until rash clear. Avoid face, groin, and axilla.   ELIQUIS 5 MG TABS tablet Take 5 mg by mouth 2 (two) times daily. (Patient not taking: Reported on 01/21/2021)   lisinopril (PRINIVIL,ZESTRIL) 5 MG tablet Take 5 mg by mouth daily. (Patient not taking: Reported on 01/21/2021)   losartan (COZAAR) 100 MG tablet Take 1 tablet by mouth daily.   ranitidine (ZANTAC) 150 MG capsule Take 150 mg by mouth as needed. (Patient not taking: Reported on 01/21/2021)   Facility-Administered Encounter Medications as of 02/10/2021  Medication   betamethasone acetate-betamethasone sodium phosphate (CELESTONE) injection 3 mg   Report of the stress test. Patient was exercised according to Bruce protocol exercise test.  With end of 6 minutes of the maximum heart rate of 120, he stopped because of shortness of breath he did not have any claudication, there is no change of myocardial ischemia noted on the EKG.  Exercise test shows functional capacity normal for his age no arrhythmia was noted during or after the stress test.  Patient does not have any claudication Blood pressure achieved is 160/80 Follow-up: No follow-ups on file.   Cletis Athens, MD

## 2021-02-16 NOTE — Assessment & Plan Note (Signed)
Refer to orthopedics 

## 2021-02-16 NOTE — Assessment & Plan Note (Signed)
Take Claritin 5 mg daily

## 2021-02-16 NOTE — Assessment & Plan Note (Signed)
Use support hose 

## 2021-02-16 NOTE — Assessment & Plan Note (Signed)
Hypercholesterolemia  I advised the patient to follow Mediterranean diet This diet is rich in fruits vegetables and whole grain, and This diet is also rich in fish and lean meat Patient should also eat a handful of almonds or walnuts daily Recent heart study indicated that average follow-up on this kind of diet reduces the cardiovascular mortality by 50 to 70%== 

## 2021-02-16 NOTE — Assessment & Plan Note (Signed)

## 2021-02-16 NOTE — Assessment & Plan Note (Signed)
Patient was advised to wear stockings

## 2021-02-25 ENCOUNTER — Other Ambulatory Visit: Payer: Self-pay | Admitting: Internal Medicine

## 2021-03-05 ENCOUNTER — Ambulatory Visit: Payer: Medicare Other | Admitting: Dermatology

## 2021-03-06 DIAGNOSIS — M5136 Other intervertebral disc degeneration, lumbar region: Secondary | ICD-10-CM | POA: Diagnosis not present

## 2021-03-06 DIAGNOSIS — M9903 Segmental and somatic dysfunction of lumbar region: Secondary | ICD-10-CM | POA: Diagnosis not present

## 2021-03-06 DIAGNOSIS — M955 Acquired deformity of pelvis: Secondary | ICD-10-CM | POA: Diagnosis not present

## 2021-03-06 DIAGNOSIS — M9905 Segmental and somatic dysfunction of pelvic region: Secondary | ICD-10-CM | POA: Diagnosis not present

## 2021-03-20 ENCOUNTER — Ambulatory Visit (INDEPENDENT_AMBULATORY_CARE_PROVIDER_SITE_OTHER): Payer: Medicare Other | Admitting: Podiatry

## 2021-03-20 ENCOUNTER — Other Ambulatory Visit: Payer: Self-pay

## 2021-03-20 ENCOUNTER — Encounter: Payer: Self-pay | Admitting: Podiatry

## 2021-03-20 DIAGNOSIS — B351 Tinea unguium: Secondary | ICD-10-CM | POA: Diagnosis not present

## 2021-03-20 DIAGNOSIS — M79675 Pain in left toe(s): Secondary | ICD-10-CM

## 2021-03-20 DIAGNOSIS — I872 Venous insufficiency (chronic) (peripheral): Secondary | ICD-10-CM

## 2021-03-20 DIAGNOSIS — M79674 Pain in right toe(s): Secondary | ICD-10-CM

## 2021-03-20 NOTE — Progress Notes (Signed)
This patient returns to my office for at risk foot care.  This patient requires this care by a professional since this patient will be at risk due to having venous insufficiency.  This patient is unable to cut nails easily  himself since the patient cannot reach his nails.These nails are painful walking and wearing shoes.  This patient presents for at risk foot care today.  General Appearance  Alert, conversant and in no acute stress.  Vascular  Dorsalis pedis and posterior tibial  pulses are palpable  bilaterally.  Capillary return is within normal limits  bilaterally. Temperature is within normal limits  bilaterally.  Neurologic  Senn-Weinstein monofilament wire test within normal limits  bilaterally. Muscle power within normal limits bilaterally.  Nails Thick disfigured discolored nails with subungual debris  from hallux to fifth toes bilaterally. No evidence of bacterial infection or drainage bilaterally.  Orthopedic  No limitations of motion  feet .  No crepitus or effusions noted.  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    Skin  normotropic skin with no porokeratosis noted bilaterally.  No signs of infections or ulcers noted.     Onychomycosis  Pain in right toes  Pain in left toes  Consent was obtained for treatment procedures.   Mechanical debridement of nails 1-5  bilaterally performed with a nail nipper.  Filed with dremel without incident. Multiple insect bites on lower legs.   Return office visit   3 months.                  Told patient to return for periodic foot care and evaluation due to potential at risk complications.     Gardiner Barefoot DPM

## 2021-04-03 DIAGNOSIS — M955 Acquired deformity of pelvis: Secondary | ICD-10-CM | POA: Diagnosis not present

## 2021-04-03 DIAGNOSIS — M9903 Segmental and somatic dysfunction of lumbar region: Secondary | ICD-10-CM | POA: Diagnosis not present

## 2021-04-03 DIAGNOSIS — M9905 Segmental and somatic dysfunction of pelvic region: Secondary | ICD-10-CM | POA: Diagnosis not present

## 2021-04-03 DIAGNOSIS — M5136 Other intervertebral disc degeneration, lumbar region: Secondary | ICD-10-CM | POA: Diagnosis not present

## 2021-04-08 ENCOUNTER — Encounter (INDEPENDENT_AMBULATORY_CARE_PROVIDER_SITE_OTHER): Payer: Self-pay | Admitting: Vascular Surgery

## 2021-04-08 ENCOUNTER — Other Ambulatory Visit: Payer: Self-pay

## 2021-04-08 ENCOUNTER — Ambulatory Visit (INDEPENDENT_AMBULATORY_CARE_PROVIDER_SITE_OTHER): Payer: Medicare Other | Admitting: Vascular Surgery

## 2021-04-08 VITALS — BP 179/76 | HR 68 | Resp 16 | Wt 242.8 lb

## 2021-04-08 DIAGNOSIS — I8001 Phlebitis and thrombophlebitis of superficial vessels of right lower extremity: Secondary | ICD-10-CM | POA: Diagnosis not present

## 2021-04-08 DIAGNOSIS — E785 Hyperlipidemia, unspecified: Secondary | ICD-10-CM | POA: Diagnosis not present

## 2021-04-08 DIAGNOSIS — I10 Essential (primary) hypertension: Secondary | ICD-10-CM | POA: Diagnosis not present

## 2021-04-08 DIAGNOSIS — I83893 Varicose veins of bilateral lower extremities with other complications: Secondary | ICD-10-CM | POA: Diagnosis not present

## 2021-04-08 NOTE — Assessment & Plan Note (Signed)
At this point, his varicosities are much less bothersome and problematic after his superficial thrombophlebitis earlier this year.  No role for intervention at this time with mild symptoms that are well controlled with compression and elevation.  Return to clinic as needed.

## 2021-04-08 NOTE — Progress Notes (Signed)
MRN : [id number]  Paul Buckley. is a 75 y.o. (Jul 08, 1946) male who presents with chief complaint of  Chief Complaint  Patient presents with   Follow-up    3 month follow up  .  History of Present Illness: Patient returns today in follow up of varicose veins and previous superficial thrombophlebitis on the right leg.  His leg is doing much better.  His prominent varicosities are much smaller than they were prior to his superficial thrombophlebitis.  He has previously been demonstrated to have resolution of the thrombophlebitis.  He is not really having significant pain or swelling at this point.  He has come off of anticoagulation and is now back on aspirin therapy.  Current Outpatient Medications  Medication Sig Dispense Refill   aspirin EC 81 MG tablet Take 81 mg by mouth daily. Swallow whole.     losartan (COZAAR) 100 MG tablet Take 1 tablet by mouth daily.     clobetasol cream (TEMOVATE) 2.75 % Apply 1 application topically 2 (two) times daily. Apply to affected areas until rash clear. Avoid face, groin, and axilla. (Patient not taking: Reported on 04/08/2021) 60 g 1   Current Facility-Administered Medications  Medication Dose Route Frequency Provider Last Rate Last Admin   betamethasone acetate-betamethasone sodium phosphate (CELESTONE) injection 3 mg  3 mg Intramuscular Once Edrick Kins, DPM        Past Medical History:  Diagnosis Date   Colon, diverticulosis    GERD (gastroesophageal reflux disease)    History of colonic polyps    Internal hemorrhoids    Pneumonia     Past Surgical History:  Procedure Laterality Date   APPENDECTOMY  1963   COLONOSCOPY WITH PROPOFOL N/A 04/25/2018   Procedure: COLONOSCOPY WITH PROPOFOL;  Surgeon: Lollie Sails, MD;  Location: [facility] ENDOSCOPY;  Service: Endoscopy;  Laterality: N/A;   EYE SURGERY  2013   HERNIA REPAIR  1986     Social History   Tobacco Use   Smoking status: Former    Years: 50.00    Types: Cigarettes     Quit date: 07/14/1967    Years since quitting: 53.7   Smokeless tobacco: Never  Vaping Use   Vaping Use: Never used  Substance Use Topics   Alcohol use: Not Currently   Drug use: No      Family History  Problem Relation Age of Onset   Cancer Mother    Cancer Father    Cancer Maternal Uncle    Cancer Paternal Uncle    Heart attack Paternal Grandfather      No Known Allergies   REVIEW OF SYSTEMS (Negative unless checked)   Constitutional: [] Weight loss  [] Fever  [] Chills Cardiac: [] Chest pain   [] Chest pressure   [] Palpitations   [] Shortness of breath when laying flat   [] Shortness of breath at rest   [] Shortness of breath with exertion. Vascular:  [x] Pain in legs with walking   [x] Pain in legs at rest   [] Pain in legs when laying flat   [] Claudication   [] Pain in feet when walking  [] Pain in feet at rest  [] Pain in feet when laying flat   [] History of DVT   [x] Phlebitis   [x] Swelling in legs   [x] Varicose veins   [] Non-healing ulcers Pulmonary:   [] Uses home oxygen   [] Productive cough   [] Hemoptysis   [] Wheeze  [] COPD   [] Asthma Neurologic:  [] Dizziness  [] Blackouts   [] Seizures   [] History of stroke   []   History of TIA  [] Aphasia   [] Temporary blindness   [] Dysphagia   [] Weakness or numbness in arms   [] Weakness or numbness in legs Musculoskeletal:  [x] Arthritis   [] Joint swelling   [x] Joint pain   [] Low back pain Hematologic:  [] Easy bruising  [] Easy bleeding   [] Hypercoagulable state   [] Anemic  [] Hepatitis Gastrointestinal:  [] Blood in stool   [] Vomiting blood  [x] Gastroesophageal reflux/heartburn   [] Abdominal pain Genitourinary:  [] Chronic kidney disease   [] Difficult urination  [] Frequent urination  [] Burning with urination   [] Hematuria Skin:  [] Rashes   [] Ulcers   [] Wounds Psychological:  [] History of anxiety   []  History of major depression.  Physical Examination  BP (!) 179/76 (BP Location: Right Arm)   Pulse 68   Resp 16   Wt 242 lb 12.8 oz (110.1 kg)   BMI 32.93  kg/m  Gen:  WD/WN, NAD. Appears younger than stated age. Head: [place]/AT, No temporalis wasting. Ear/Nose/Throat: Hearing grossly intact, nares w/o erythema or drainage Eyes: Conjunctiva clear. Sclera non-icteric Neck: Supple.  Trachea midline Pulmonary:  Good air movement, no use of accessory muscles.  Cardiac: RRR, no JVD Vascular:  Vessel Right Left  Radial Palpable Palpable                          PT Palpable Palpable  DP Palpable Palpable   Gastrointestinal: soft, non-tender/non-distended. No guarding/reflex.  Musculoskeletal: M/S 5/5 throughout.  No deformity or atrophy.  Varicosities are present in the right medial thigh and calf area although less prominent than before.  Trace right lower extremity edema. Neurologic: Sensation grossly intact in extremities.  Symmetrical.  Speech is fluent.  Psychiatric: Judgment intact, Mood & affect appropriate for pt's clinical situation. Dermatologic: No rashes or ulcers noted.  No cellulitis or open wounds.      Labs Recent Results (from the past 2160 hour(s))  Basic metabolic panel     Status: Abnormal   Collection Time: 01/25/21  2:23 AM  Result Value Ref Range   Sodium 137 135 - 145 mmol/L   Potassium 4.6 3.5 - 5.1 mmol/L    Comment: HEMOLYSIS AT THIS LEVEL MAY AFFECT RESULT   Chloride 105 98 - 111 mmol/L   CO2 24 22 - 32 mmol/L   Glucose, Bld 113 (H) 70 - 99 mg/dL    Comment: Glucose reference range applies only to samples taken after fasting for at least 8 hours.   BUN 18 8 - 23 mg/dL   Creatinine, Ser 1.03 0.61 - 1.24 mg/dL   Calcium 8.4 (L) 8.9 - 10.3 mg/dL   GFR, Estimated >60 >60 mL/min    Comment: (NOTE) Calculated using the CKD-EPI Creatinine Equation (2021)    Anion gap 8 5 - 15    Comment: Performed at [facility], [address] [place], [place] [address]  CBC     Status: None   Collection Time: 01/25/21  2:23 AM  Result Value Ref Range   WBC 10.4 4.0 - 10.5 K/uL   RBC 4.23 4.22 - 5.81 MIL/uL    Hemoglobin 13.5 13.0 - 17.0 g/dL   HCT 40.8 39.0 - 52.0 %   MCV 96.5 80.0 - 100.0 fL   MCH 31.9 26.0 - 34.0 pg   MCHC 33.1 30.0 - 36.0 g/dL   RDW 13.2 11.5 - 15.5 %   Platelets 285 150 - 400 K/uL   nRBC 0.0 0.0 - 0.2 %    Comment: Performed at  [facility], [place] [address]  Troponin I (High Sensitivity)     Status: None   Collection Time: 01/25/21  2:23 AM  Result Value Ref Range   Troponin I (High Sensitivity) 13 <18 ng/L    Comment: (NOTE) Elevated high sensitivity troponin I (hsTnI) values and significant  changes across serial measurements may suggest ACS but many other  chronic and acute conditions are known to elevate hsTnI results.  Refer to the "Links" section for chest pain algorithms and additional  guidance. Performed at [facility], [address] [place], [place] [address]   Hepatic function panel     Status: Abnormal   Collection Time: 01/25/21  2:23 AM  Result Value Ref Range   Total Protein 7.3 6.5 - 8.1 g/dL   Albumin 3.6 3.5 - 5.0 g/dL   AST 23 15 - 41 U/L    Comment: HEMOLYSIS AT THIS LEVEL MAY AFFECT RESULT   ALT <5 0 - 44 U/L    Comment: HEMOLYSIS AT THIS LEVEL MAY AFFECT RESULT   Alkaline Phosphatase 55 38 - 126 U/L   Total Bilirubin 1.3 (H) 0.3 - 1.2 mg/dL    Comment: HEMOLYSIS AT THIS LEVEL MAY AFFECT RESULT   Bilirubin, Direct 0.3 (H) 0.0 - 0.2 mg/dL    Comment: HEMOLYSIS AT THIS LEVEL MAY AFFECT RESULT   Indirect Bilirubin 1.0 (H) 0.3 - 0.9 mg/dL    Comment: Performed at [facility], [address] [place], [place] [address]  Lipase, blood     Status: None   Collection Time: 01/25/21  2:23 AM  Result Value Ref Range   Lipase 28 11 - 51 U/L    Comment: Performed at [facility], [place], [place] [address]  Troponin I (High Sensitivity)     Status: None   Collection Time: 01/25/21  4:40 AM  Result Value Ref Range   Troponin I (High Sensitivity) 11 <18 ng/L     Comment: (NOTE) Elevated high sensitivity troponin I (hsTnI) values and significant  changes across serial measurements may suggest ACS but many other  chronic and acute conditions are known to elevate hsTnI results.  Refer to the "Links" section for chest pain algorithms and additional  guidance. Performed at [facility], [address], [place], [place] [address]     Radiology No results found.  Assessment/Plan Essential hypertension blood pressure control important in reducing the progression of atherosclerotic disease. On appropriate oral medications.     Hyperlipidemia lipid control important in reducing the progression of atherosclerotic disease. Continue statin therapy   Superficial thrombophlebitis Duplex previously shows resolution of the superficial thrombophlebitis of the right lower extremity with no current DVT or superficial thrombophlebitis present.  On ASA  Varicose veins of bilateral lower extremities with other complications At this point, his varicosities are much less bothersome and problematic after his superficial thrombophlebitis earlier this year.  No role for intervention at this time with mild symptoms that are well controlled with compression and elevation.  Return to clinic as needed.    Leotis Pain, MD  04/08/2021 12:38 PM    This note was created with Dragon medical transcription system.  Any errors from dictation are purely unintentional

## 2021-04-21 DIAGNOSIS — Z23 Encounter for immunization: Secondary | ICD-10-CM | POA: Diagnosis not present

## 2021-04-29 ENCOUNTER — Other Ambulatory Visit: Payer: Self-pay

## 2021-04-29 ENCOUNTER — Other Ambulatory Visit (INDEPENDENT_AMBULATORY_CARE_PROVIDER_SITE_OTHER): Payer: Medicare Other

## 2021-04-29 DIAGNOSIS — I83893 Varicose veins of bilateral lower extremities with other complications: Secondary | ICD-10-CM | POA: Diagnosis not present

## 2021-04-29 DIAGNOSIS — Z23 Encounter for immunization: Secondary | ICD-10-CM | POA: Diagnosis not present

## 2021-04-29 DIAGNOSIS — R972 Elevated prostate specific antigen [PSA]: Secondary | ICD-10-CM | POA: Diagnosis not present

## 2021-04-29 DIAGNOSIS — Z0000 Encounter for general adult medical examination without abnormal findings: Secondary | ICD-10-CM | POA: Diagnosis not present

## 2021-04-29 NOTE — Addendum Note (Signed)
Addended by: Lacretia Nicks L on: 04/29/2021 09:01 AM   Modules accepted: Orders, Level of Service

## 2021-04-30 LAB — CBC WITH DIFFERENTIAL/PLATELET
Absolute Monocytes: 437 cells/uL (ref 200–950)
Basophils Absolute: 38 cells/uL (ref 0–200)
Basophils Relative: 0.8 %
Eosinophils Absolute: 230 cells/uL (ref 15–500)
Eosinophils Relative: 4.8 %
HCT: 39.2 % (ref 38.5–50.0)
Hemoglobin: 13.5 g/dL (ref 13.2–17.1)
Lymphs Abs: 1459 cells/uL (ref 850–3900)
MCH: 32.8 pg (ref 27.0–33.0)
MCHC: 34.4 g/dL (ref 32.0–36.0)
MCV: 95.1 fL (ref 80.0–100.0)
MPV: 9.9 fL (ref 7.5–12.5)
Monocytes Relative: 9.1 %
Neutro Abs: 2635 cells/uL (ref 1500–7800)
Neutrophils Relative %: 54.9 %
Platelets: 287 10*3/uL (ref 140–400)
RBC: 4.12 10*6/uL — ABNORMAL LOW (ref 4.20–5.80)
RDW: 13.1 % (ref 11.0–15.0)
Total Lymphocyte: 30.4 %
WBC: 4.8 10*3/uL (ref 3.8–10.8)

## 2021-04-30 LAB — COMPLETE METABOLIC PANEL WITH GFR
AG Ratio: 1.5 (calc) (ref 1.0–2.5)
ALT: 11 U/L (ref 9–46)
AST: 16 U/L (ref 10–35)
Albumin: 3.8 g/dL (ref 3.6–5.1)
Alkaline phosphatase (APISO): 59 U/L (ref 35–144)
BUN: 17 mg/dL (ref 7–25)
CO2: 26 mmol/L (ref 20–32)
Calcium: 8.6 mg/dL (ref 8.6–10.3)
Chloride: 106 mmol/L (ref 98–110)
Creat: 0.95 mg/dL (ref 0.70–1.28)
Globulin: 2.5 g/dL (calc) (ref 1.9–3.7)
Glucose, Bld: 98 mg/dL (ref 65–99)
Potassium: 4.3 mmol/L (ref 3.5–5.3)
Sodium: 141 mmol/L (ref 135–146)
Total Bilirubin: 0.4 mg/dL (ref 0.2–1.2)
Total Protein: 6.3 g/dL (ref 6.1–8.1)
eGFR: 83 mL/min/{1.73_m2} (ref >=60)

## 2021-04-30 LAB — LIPID PANEL
Cholesterol: 135 mg/dL (ref <200)
HDL: 39 mg/dL — ABNORMAL LOW (ref >=40)
LDL Cholesterol (Calc): 82 mg/dL (calc)
Non-HDL Cholesterol (Calc): 96 mg/dL (calc) (ref <130)
Total CHOL/HDL Ratio: 3.5 (calc) (ref <5.0)
Triglycerides: 61 mg/dL (ref <150)

## 2021-04-30 LAB — HEPATITIS C ANTIBODY
Hepatitis C Ab: NONREACTIVE
SIGNAL TO CUT-OFF: 0.18 (ref <1.00)

## 2021-04-30 LAB — PSA: PSA: 0.61 ng/mL (ref <=4.00)

## 2021-04-30 LAB — TSH: TSH: 2.14 mIU/L (ref 0.40–4.50)

## 2021-05-01 DIAGNOSIS — M955 Acquired deformity of pelvis: Secondary | ICD-10-CM | POA: Diagnosis not present

## 2021-05-01 DIAGNOSIS — M9905 Segmental and somatic dysfunction of pelvic region: Secondary | ICD-10-CM | POA: Diagnosis not present

## 2021-05-01 DIAGNOSIS — M9903 Segmental and somatic dysfunction of lumbar region: Secondary | ICD-10-CM | POA: Diagnosis not present

## 2021-05-01 DIAGNOSIS — M5136 Other intervertebral disc degeneration, lumbar region: Secondary | ICD-10-CM | POA: Diagnosis not present

## 2021-05-06 ENCOUNTER — Ambulatory Visit (INDEPENDENT_AMBULATORY_CARE_PROVIDER_SITE_OTHER): Payer: Medicare Other | Admitting: Internal Medicine

## 2021-05-06 ENCOUNTER — Encounter: Payer: Self-pay | Admitting: Internal Medicine

## 2021-05-06 ENCOUNTER — Other Ambulatory Visit: Payer: Self-pay

## 2021-05-06 VITALS — BP 130/80 | HR 74 | Ht 72.0 in | Wt 246.0 lb

## 2021-05-06 DIAGNOSIS — I83893 Varicose veins of bilateral lower extremities with other complications: Secondary | ICD-10-CM | POA: Diagnosis not present

## 2021-05-06 DIAGNOSIS — I10 Essential (primary) hypertension: Secondary | ICD-10-CM | POA: Diagnosis not present

## 2021-05-06 DIAGNOSIS — E785 Hyperlipidemia, unspecified: Secondary | ICD-10-CM

## 2021-05-06 DIAGNOSIS — M720 Palmar fascial fibromatosis [Dupuytren]: Secondary | ICD-10-CM | POA: Diagnosis not present

## 2021-05-06 DIAGNOSIS — Z0000 Encounter for general adult medical examination without abnormal findings: Secondary | ICD-10-CM | POA: Diagnosis not present

## 2021-05-06 DIAGNOSIS — I8001 Phlebitis and thrombophlebitis of superficial vessels of right lower extremity: Secondary | ICD-10-CM | POA: Diagnosis not present

## 2021-05-06 NOTE — Assessment & Plan Note (Signed)
Hypercholesterolemia  I advised the patient to follow Mediterranean diet This diet is rich in fruits vegetables and whole grain, and This diet is also rich in fish and lean meat Patient should also eat a handful of almonds or walnuts daily Recent heart study indicated that average follow-up on this kind of diet reduces the cardiovascular mortality by 50 to 70%== 

## 2021-05-06 NOTE — Assessment & Plan Note (Signed)

## 2021-05-06 NOTE — Assessment & Plan Note (Signed)
Patient denies any chest pain or shortness of breath.  Blood pressure is stable.  He has varicose veins on the right leg and has 1+ pedal edema there is no evidence of blood clot. Heart is regular chest is clear abdomen is soft nontender without any hepatosplenomegaly.  Neurological examination is nonfocal

## 2021-05-06 NOTE — Assessment & Plan Note (Signed)
Patient has varicose veins of the right leg and has 2+ pedal edema, he sees Dr. Lucky Cowboy for that

## 2021-05-06 NOTE — Assessment & Plan Note (Signed)
Resolved

## 2021-05-06 NOTE — Progress Notes (Signed)
Established Patient Office Visit  Subjective:  Patient ID: Paul Ruddy., male    DOB: Nov 17, 1945  Age: 75 y.o. MRN: [id number]  CC:  Chief Complaint  Patient presents with   Annual Exam    HPI  Paul Nevins. presents for physical  Past Medical History:  Diagnosis Date   Colon, diverticulosis    GERD (gastroesophageal reflux disease)    History of colonic polyps    Internal hemorrhoids    Pneumonia     Past Surgical History:  Procedure Laterality Date   APPENDECTOMY  1963   COLONOSCOPY WITH PROPOFOL N/A 04/25/2018   Procedure: COLONOSCOPY WITH PROPOFOL;  Surgeon: Lollie Sails, MD;  Location: [facility] ENDOSCOPY;  Service: Endoscopy;  Laterality: N/A;   EYE SURGERY  2013   HERNIA REPAIR  1986    Family History  Problem Relation Age of Onset   Cancer Mother    Cancer Father    Cancer Maternal Uncle    Cancer Paternal Uncle    Heart attack Paternal Grandfather     Social History   Socioeconomic History   Marital status: Married    Spouse name: Not on file   Number of children: Not on file   Years of education: Not on file   Highest education level: Not on file  Occupational History   Not on file  Tobacco Use   Smoking status: Former    Years: 50.00    Types: Cigarettes    Quit date: 07/14/1967    Years since quitting: 53.8   Smokeless tobacco: Never  Vaping Use   Vaping Use: Never used  Substance and Sexual Activity   Alcohol use: Not Currently   Drug use: No   Sexual activity: Not on file  Other Topics Concern   Not on file  Social History Narrative   Not on file   Social Determinants of Health   Financial Resource Strain: Not on file  Food Insecurity: Not on file  Transportation Needs: Not on file  Physical Activity: Not on file  Stress: Not on file  Social Connections: Not on file  Intimate Partner Violence: Not on file     Current Outpatient Medications:    aspirin EC 81 MG tablet, Take 81 mg by mouth daily. Swallow whole.,  Disp: , Rfl:    clobetasol cream (TEMOVATE) 0.97 %, Apply 1 application topically 2 (two) times daily. Apply to affected areas until rash clear. Avoid face, groin, and axilla. (Patient not taking: Reported on 04/08/2021), Disp: 60 g, Rfl: 1   losartan (COZAAR) 100 MG tablet, Take 1 tablet by mouth daily., Disp: , Rfl:   Current Facility-Administered Medications:    betamethasone acetate-betamethasone sodium phosphate (CELESTONE) injection 3 mg, 3 mg, Intramuscular, Once, Evans, Brent M, DPM   No Known Allergies  ROS Review of Systems  Constitutional: Negative.   HENT: Negative.    Eyes: Negative.   Respiratory: Negative.    Cardiovascular: Negative.   Gastrointestinal: Negative.   Endocrine: Negative.   Genitourinary: Negative.   Musculoskeletal: Negative.   Skin: Negative.   Allergic/Immunologic: Negative.   Neurological: Negative.   Hematological: Negative.   Psychiatric/Behavioral: Negative.    All other systems reviewed and are negative.    Objective:    Physical Exam Vitals reviewed.  Constitutional:      Appearance: Normal appearance.  HENT:     Mouth/Throat:     Mouth: Mucous membranes are moist.  Eyes:     Pupils: Pupils  are equal, round, and reactive to light.  Neck:     Vascular: No carotid bruit.  Cardiovascular:     Rate and Rhythm: Normal rate and regular rhythm.     Pulses: Normal pulses.     Heart sounds: Normal heart sounds.  Pulmonary:     Effort: Pulmonary effort is normal.     Breath sounds: Normal breath sounds.  Abdominal:     General: Bowel sounds are normal.     Palpations: Abdomen is soft. There is no hepatomegaly, splenomegaly or mass.     Tenderness: There is no abdominal tenderness.     Hernia: No hernia is present.  Musculoskeletal:     Cervical back: Neck supple.     Right lower leg: No edema.     Left lower leg: No edema.  Skin:    Findings: No rash.  Neurological:     Mental Status: He is alert and oriented to person, place,  and time.     Motor: No weakness.  Psychiatric:        Mood and Affect: Mood normal.        Behavior: Behavior normal.    BP 130/80   Pulse 74   Ht 6' (1.829 m)   Wt 246 lb (111.6 kg)   BMI 33.36 kg/m  Wt Readings from Last 3 Encounters:  05/06/21 246 lb (111.6 kg)  04/08/21 242 lb 12.8 oz (110.1 kg)  01/30/21 238 lb 1.6 oz (108 kg)     Health Maintenance Due  Topic Date Due   COVID-19 Vaccine (3 - Booster for Moderna series) 08/12/2020    There are no preventive care reminders to display for this patient.  Lab Results  Component Value Date   TSH 2.14 04/29/2021   Lab Results  Component Value Date   WBC 4.8 04/29/2021   HGB 13.5 04/29/2021   HCT 39.2 04/29/2021   MCV 95.1 04/29/2021   PLT 287 04/29/2021   Lab Results  Component Value Date   NA 141 04/29/2021   K 4.3 04/29/2021   CO2 26 04/29/2021   GLUCOSE 98 04/29/2021   BUN 17 04/29/2021   CREATININE 0.95 04/29/2021   BILITOT 0.4 04/29/2021   ALKPHOS 55 01/25/2021   AST 16 04/29/2021   ALT 11 04/29/2021   PROT 6.3 04/29/2021   ALBUMIN 3.6 01/25/2021   CALCIUM 8.6 04/29/2021   ANIONGAP 8 01/25/2021   EGFR 83 04/29/2021   Lab Results  Component Value Date   CHOL 135 04/29/2021   Lab Results  Component Value Date   HDL 39 (L) 04/29/2021   Lab Results  Component Value Date   LDLCALC 82 04/29/2021   Lab Results  Component Value Date   TRIG 61 04/29/2021   Lab Results  Component Value Date   CHOLHDL 3.5 04/29/2021   No results found for: HGBA1C    Assessment & Plan:   Problem List Items Addressed This Visit       Cardiovascular and Mediastinum   Varicose veins of bilateral lower extremities with other complications - Primary    Patient has varicose veins of the right leg and has 2+ pedal edema, he sees Dr. Lucky Cowboy for that      Essential hypertension     Patient denies any chest pain or shortness of breath there is no history of palpitation or paroxysmal nocturnal dyspnea   patient  was advised to follow low-salt low-cholesterol diet    ideally I want to keep systolic blood  pressure below 130 mmHg, patient was asked to check blood pressure one times a week and give me a report on that.  Patient will be follow-up in 3 months  or earlier as needed, patient will call me back for any change in the cardiovascular symptoms Patient was advised to buy a book from local bookstore concerning blood pressure and read several chapters  every day.  This will be supplemented by some of the material we will give him from the office.  Patient should also utilize other resources like YouTube and Internet to learn more about the blood pressure and the diet.      Superficial thrombophlebitis    Resolved        Musculoskeletal and Integument   Dupuytren's disease of palm    No deterioration of the contracture        Other   Hyperlipidemia    Hypercholesterolemia  I advised the patient to follow Mediterranean diet This diet is rich in fruits vegetables and whole grain, and This diet is also rich in fish and lean meat Patient should also eat a handful of almonds or walnuts daily Recent heart study indicated that average follow-up on this kind of diet reduces the cardiovascular mortality by 50 to 70%==      Annual physical exam    Patient denies any chest pain or shortness of breath.  Blood pressure is stable.  He has varicose veins on the right leg and has 1+ pedal edema there is no evidence of blood clot. Heart is regular chest is clear abdomen is soft nontender without any hepatosplenomegaly.  Neurological examination is nonfocal       No orders of the defined types were placed in this encounter.   Follow-up: No follow-ups on file.    Cletis Athens, MD

## 2021-05-06 NOTE — Assessment & Plan Note (Signed)
No deterioration of the contracture

## 2021-05-29 DIAGNOSIS — M9903 Segmental and somatic dysfunction of lumbar region: Secondary | ICD-10-CM | POA: Diagnosis not present

## 2021-05-29 DIAGNOSIS — M9905 Segmental and somatic dysfunction of pelvic region: Secondary | ICD-10-CM | POA: Diagnosis not present

## 2021-05-29 DIAGNOSIS — M5136 Other intervertebral disc degeneration, lumbar region: Secondary | ICD-10-CM | POA: Diagnosis not present

## 2021-05-29 DIAGNOSIS — M955 Acquired deformity of pelvis: Secondary | ICD-10-CM | POA: Diagnosis not present

## 2021-06-04 DIAGNOSIS — H2512 Age-related nuclear cataract, left eye: Secondary | ICD-10-CM | POA: Diagnosis not present

## 2021-06-19 ENCOUNTER — Ambulatory Visit (INDEPENDENT_AMBULATORY_CARE_PROVIDER_SITE_OTHER): Payer: Medicare Other | Admitting: Podiatry

## 2021-06-19 ENCOUNTER — Encounter: Payer: Self-pay | Admitting: Podiatry

## 2021-06-19 ENCOUNTER — Other Ambulatory Visit: Payer: Self-pay

## 2021-06-19 DIAGNOSIS — B351 Tinea unguium: Secondary | ICD-10-CM

## 2021-06-19 DIAGNOSIS — M79675 Pain in left toe(s): Secondary | ICD-10-CM

## 2021-06-19 DIAGNOSIS — M79674 Pain in right toe(s): Secondary | ICD-10-CM

## 2021-06-19 DIAGNOSIS — I872 Venous insufficiency (chronic) (peripheral): Secondary | ICD-10-CM

## 2021-06-19 NOTE — Progress Notes (Signed)
This patient returns to my office for at risk foot care.  This patient requires this care by a professional since this patient will be at risk due to having venous insufficiency.  This patient is unable to cut nails easily  himself since the patient cannot reach his nails.These nails are painful walking and wearing shoes.  This patient presents for at risk foot care today.  General Appearance  Alert, conversant and in no acute stress.  Vascular  Dorsalis pedis and posterior tibial  pulses are palpable  bilaterally.  Capillary return is within normal limits  bilaterally. Temperature is within normal limits  bilaterally.  Neurologic  Senn-Weinstein monofilament wire test within normal limits  bilaterally. Muscle power within normal limits bilaterally.  Nails Thick disfigured discolored nails with subungual debris  from hallux to fifth toes bilaterally. No evidence of bacterial infection or drainage bilaterally.  Orthopedic  No limitations of motion  feet .  No crepitus or effusions noted.  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    Skin  normotropic skin with no porokeratosis noted bilaterally.  No signs of infections or ulcers noted.     Onychomycosis  Pain in right toes  Pain in left toes  Consent was obtained for treatment procedures.   Mechanical debridement of nails 1-5  bilaterally performed with a nail nipper.  Filed with dremel without incident. Multiple insect bites on lower legs.   Return office visit   3 months.                  Told patient to return for periodic foot care and evaluation due to potential at risk complications.     Gardiner Barefoot DPM

## 2021-06-26 DIAGNOSIS — M9903 Segmental and somatic dysfunction of lumbar region: Secondary | ICD-10-CM | POA: Diagnosis not present

## 2021-06-26 DIAGNOSIS — M955 Acquired deformity of pelvis: Secondary | ICD-10-CM | POA: Diagnosis not present

## 2021-06-26 DIAGNOSIS — M9905 Segmental and somatic dysfunction of pelvic region: Secondary | ICD-10-CM | POA: Diagnosis not present

## 2021-06-26 DIAGNOSIS — M5136 Other intervertebral disc degeneration, lumbar region: Secondary | ICD-10-CM | POA: Diagnosis not present

## 2021-07-02 ENCOUNTER — Ambulatory Visit (INDEPENDENT_AMBULATORY_CARE_PROVIDER_SITE_OTHER): Payer: Medicare Other | Admitting: Dermatology

## 2021-07-02 ENCOUNTER — Other Ambulatory Visit: Payer: Self-pay

## 2021-07-02 DIAGNOSIS — L011 Impetiginization of other dermatoses: Secondary | ICD-10-CM | POA: Diagnosis not present

## 2021-07-02 DIAGNOSIS — L309 Dermatitis, unspecified: Secondary | ICD-10-CM | POA: Diagnosis not present

## 2021-07-02 DIAGNOSIS — D692 Other nonthrombocytopenic purpura: Secondary | ICD-10-CM

## 2021-07-02 DIAGNOSIS — R21 Rash and other nonspecific skin eruption: Secondary | ICD-10-CM

## 2021-07-02 MED ORDER — CLOBETASOL PROPIONATE 0.05 % EX CREA: 1.0000 "application " | TOPICAL_CREAM | Freq: Every day | CUTANEOUS | 0 refills | Status: DC

## 2021-07-02 MED ORDER — MUPIROCIN 2 % EX OINT
1.0000 | TOPICAL_OINTMENT | Freq: Every day | CUTANEOUS | 0 refills | Status: DC
Start: 2021-07-02 — End: 2022-03-25

## 2021-07-02 NOTE — Patient Instructions (Addendum)
Start mupirocin on area once daily Start clobetasol 0.05% cream once daily x 2 weeks then decrease to 5 days a week. Avoid applying to face, groin, and axilla. Use as directed. Long-term use can cause thinning of the skin.  Recommend [place] Non-Sterile Self-Adherent Wrap in place of band-aids.  Topical steroids (such as triamcinolone, fluocinolone, fluocinonide, mometasone, clobetasol, halobetasol, betamethasone, hydrocortisone) can cause thinning and lightening of the skin if they are used for too long in the same area. Your physician has selected the right strength medicine for your problem and area affected on the body. Please use your medication only as directed by your physician to prevent side effects.   If You Need Anything After Your Visit  If you have any questions or concerns for your doctor, please call our main line at [phone] and press option 4 to reach your doctor's medical assistant. If no one answers, please leave a voicemail as directed and we will return your call as soon as possible. Messages left after 4 pm will be answered the following business day.   You may also send [place] a message via [place]. We typically respond to MyChart messages within 1-2 business days.  For prescription refills, please ask your pharmacy to contact our office. Our fax number is [phone].  If you have an urgent issue when the clinic is closed that cannot wait until the next business day, you can page your doctor at the number below.    Please note that while we do our best to be available for urgent issues outside of office hours, we are not available 24/7.   If you have an urgent issue and are unable to reach [place], you may choose to seek medical care at your doctor's office, retail clinic, urgent care center, or emergency room.  If you have a medical emergency, please immediately call 911 or go to the emergency department.  Pager Numbers  - Dr. Nehemiah Massed: [phone]  - Dr. Laurence Ferrari:  [phone]  - Dr. Nicole Kindred: [phone]  In the event of inclement weather, please call our main line at [phone] for an update on the status of any delays or closures.  Dermatology Medication Tips: Please keep the boxes that topical medications come in in order to help keep track of the instructions about where and how to use these. Pharmacies typically print the medication instructions only on the boxes and not directly on the medication tubes.   If your medication is too expensive, please contact our office at [phone] option 4 or send [place] a message through [place].   We are unable to tell what your co-pay for medications will be in advance as this is different depending on your insurance coverage. However, we may be able to find a substitute medication at lower cost or fill out paperwork to get insurance to cover a needed medication.   If a prior authorization is required to get your medication covered by your insurance company, please allow [place] 1-2 business days to complete this process.  Drug prices often vary depending on where the prescription is filled and some pharmacies may offer cheaper prices.  The website www.goodrx.com contains coupons for medications through different pharmacies. The prices here do not account for what the cost may be with help from insurance (it may be cheaper with your insurance), but the website can give you the price if you did not use any insurance.  - You can print the associated coupon and take it with your prescription to the pharmacy.  -  You may also stop by our office during regular business hours and pick up a GoodRx coupon card.  - If you need your prescription sent electronically to a different pharmacy, notify our office through [facility] or by phone at [phone] option 4.     Si Usted Necesita Algo Despus de Su Visita  Tambin puede enviarnos un mensaje a travs de Pharmacist, community. Por lo general respondemos a los mensajes de  MyChart en el transcurso de 1 a 2 das hbiles.  Para renovar recetas, por favor pida a su farmacia que se ponga en contacto con nuestra oficina. Harland Dingwall de fax es [place] [phone].  Si tiene un asunto urgente cuando la clnica est cerrada y que no puede esperar hasta el siguiente da hbil, puede llamar/localizar a su doctor(a) al nmero que aparece a continuacin.   Por favor, tenga en cuenta que aunque hacemos todo lo posible para estar disponibles para asuntos urgentes fuera del horario de [place], no estamos disponibles las 24 horas del da, los 7 das de la [place].   Si tiene un problema urgente y no puede comunicarse con nosotros, puede optar por buscar atencin mdica  en el consultorio de su doctor(a), en una clnica privada, en un centro de atencin urgente o en una sala de emergencias.  Si tiene Engineering geologist, por favor llame inmediatamente al 911 o vaya a la sala de emergencias.  Nmeros de bper  - Dr. Nehemiah Massed: [phone]  - Dra. Moye: [phone]  - Dra. Nicole Kindred: [phone]  En caso de inclemencias del [place], por favor llame a Johnsie Kindred principal al [phone] para una actualizacin sobre el [place] de cualquier retraso o cierre.  Consejos para la medicacin en dermatologa: Por favor, guarde las cajas en las que vienen los medicamentos de uso tpico para ayudarle a seguir las instrucciones sobre dnde y cmo usarlos. Las farmacias generalmente imprimen las instrucciones del medicamento slo en las cajas y no directamente en los tubos del [place].   Si su medicamento es muy caro, por favor, pngase en contacto con Zigmund Daniel llamando al [phone] y presione la opcin 4 o envenos un mensaje a travs de Pharmacist, community.   No podemos decirle cul ser su copago por los medicamentos por adelantado ya que esto es diferente dependiendo de la cobertura de su seguro. Sin embargo, es posible que podamos encontrar un medicamento sustituto a Contractor un formulario para que el seguro cubra el medicamento que se considera necesario.   Si se requiere una autorizacin previa para que su compaa de seguros [place] su medicamento, por favor permtanos de 1 a 2 das hbiles para completar este proceso.  Los precios de los medicamentos varan con frecuencia dependiendo del Environmental consultant de dnde se surte la receta y alguna farmacias pueden ofrecer precios ms baratos.  El sitio web www.goodrx.com tiene cupones para medicamentos de Airline pilot. Los precios aqu no tienen en cuenta lo que podra costar con la ayuda del seguro (puede ser ms barato con su seguro), pero el sitio web puede darle el precio si no utiliz Research scientist (physical sciences).  - Puede imprimir el cupn correspondiente y llevarlo con su receta a la farmacia.  - Tambin puede pasar por nuestra oficina durante el horario de atencin regular y Charity fundraiser una tarjeta de cupones de GoodRx.  - Si necesita que su receta se enve electrnicamente a Chiropodist, informe a nuestra oficina a travs de MyChart de [place] o por telfono llamando al [phone] y  presione la opcin 4.

## 2021-07-02 NOTE — Progress Notes (Signed)
° °  Follow-Up Visit   Subjective  Paul Buckley. is a 75 y.o. male who presents for the following: Rash (Patient here today for a rash at right popliteal. Patient saw chiropractor yesterday and they suggested he go to urgent care. Patient advises he saw Dr. Nicole Kindred in July for same thing and was given clobetasol cream to use which did help. Patient has only used neosporin at area and advises it does itch. ). He also complains about bruising on the arms and wonders about this.  The following portions of the chart were reviewed this encounter and updated as appropriate:   Tobacco   Allergies   Meds   Problems   Med Hx   Surg Hx   Fam Hx      Review of Systems:  No other skin or systemic complaints except as noted in HPI or Assessment and Plan.  Objective  Well appearing patient in no apparent distress; mood and affect are within normal limits.  A focused examination was performed including right leg. Relevant physical exam findings are noted in the Assessment and Plan.  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  Start mupirocin on area once daily Start clobetasol 0.05% cream once daily for 2 weeks then decrease to 5 days a week. Avoid applying to face, groin, and axilla. Use as directed. Long-term use can cause thinning of the skin.  Atopic dermatitis (eczema) is a chronic, relapsing, pruritic condition that can significantly affect quality of life. It is often associated with allergic rhinitis and/or asthma and can require treatment with topical medications, phototherapy, or in severe cases biologic injectable medication (Dupixent; Adbry) or Oral JAK inhibitors.  Topical steroids (such as triamcinolone, fluocinolone, fluocinonide, mometasone, clobetasol, halobetasol, betamethasone, hydrocortisone) can cause thinning and  lightening of the skin if they are used for too long in the same area. Your physician has selected the right strength medicine for your problem and area affected on the body. Please use your medication only as directed by your physician to prevent side effects.   mupirocin ointment (BACTROBAN) 2 % - Right Popliteal Fossa Apply 1 application topically daily.  clobetasol cream (TEMOVATE) 0.05 % - Right Popliteal Fossa Apply 1 application topically daily. For 2 weeks then decrease to 5 days a week. Avoid applying to face, groin, and axilla. Use as directed. Long-term use can cause thinning of the skin.  Purpura - Chronic; persistent and recurrent.  Treatable, but not curable. - Violaceous macules and patches - Benign - Related to trauma, age, sun damage and/or use of blood thinners, chronic use of topical and/or oral steroids - Observe - Can use OTC arnica containing moisturizer such as Dermend Bruise Formula if desired - Call for worsening or other concerns  Return in about 4 weeks (around 07/30/2021) for Eczema.  Graciella Belton, RMA, am acting as scribe for Sarina Ser, MD . Documentation: I have reviewed the above documentation for accuracy and completeness, and I agree with the above.  Sarina Ser, MD

## 2021-07-12 ENCOUNTER — Other Ambulatory Visit: Payer: Self-pay | Admitting: Internal Medicine

## 2021-07-13 ENCOUNTER — Encounter: Payer: Self-pay | Admitting: Dermatology

## 2021-07-24 DIAGNOSIS — M5136 Other intervertebral disc degeneration, lumbar region: Secondary | ICD-10-CM | POA: Diagnosis not present

## 2021-07-24 DIAGNOSIS — M9903 Segmental and somatic dysfunction of lumbar region: Secondary | ICD-10-CM | POA: Diagnosis not present

## 2021-07-24 DIAGNOSIS — M9905 Segmental and somatic dysfunction of pelvic region: Secondary | ICD-10-CM | POA: Diagnosis not present

## 2021-07-24 DIAGNOSIS — M955 Acquired deformity of pelvis: Secondary | ICD-10-CM | POA: Diagnosis not present

## 2021-07-30 ENCOUNTER — Other Ambulatory Visit: Payer: Self-pay

## 2021-07-30 ENCOUNTER — Ambulatory Visit (INDEPENDENT_AMBULATORY_CARE_PROVIDER_SITE_OTHER): Payer: Medicare Other | Admitting: Dermatology

## 2021-07-30 DIAGNOSIS — L309 Dermatitis, unspecified: Secondary | ICD-10-CM | POA: Diagnosis not present

## 2021-07-30 MED ORDER — CLOBETASOL PROPIONATE 0.05 % EX SOLN: CUTANEOUS | 1 refills | Status: DC

## 2021-07-30 NOTE — Patient Instructions (Addendum)
Eczema Skin Care  Buy TWO 16oz jars of CeraVe moisturizing cream  [facility], [facility], [facility] (no prescription needed)  Costs about $15 per jar   Jar #1: Use as a moisturizer as needed. Can be applied to any area of the body. Use twice daily to unaffected areas.  Jar #2: Pour one 89ml bottle of clobetasol 0.05% solution into jar, mix well. Label this jar to indicate the medication has been added. Use twice daily to affected areas. Do not apply to face, groin or underarms.  Moisturizer may burn or sting initially. Try for at least 4 weeks.     Topical steroids (such as triamcinolone, fluocinolone, fluocinonide, mometasone, clobetasol, halobetasol, betamethasone, hydrocortisone) can cause thinning and lightening of the skin if they are used for too long in the same area. Your physician has selected the right strength medicine for your problem and area affected on the body. Please use your medication only as directed by your physician to prevent side effects.    Gentle Skin Care Guide  1. Bathe no more than once a day.  2. Avoid bathing in hot water  3. Use a mild soap like Dove, Vanicream, Cetaphil, CeraVe. Can use Lever 2000 or Cetaphil antibacterial soap  4. Use soap only where you need it. On most days, use it under your arms, between your legs, and on your feet. Let the water rinse other areas unless visibly dirty.  5. When you get out of the bath/shower, use a towel to gently blot your skin dry, don't rub it.  6. While your skin is still a little damp, apply a moisturizing cream such as Vanicream, CeraVe, Cetaphil, Eucerin, Sarna lotion or plain Vaseline Jelly. For hands apply Neutrogena [place] Hand Cream or Excipial Hand Cream.  7. Reapply moisturizer any time you start to itch or feel dry.  8. Sometimes using free and clear laundry detergents can be helpful. Fabric softener sheets should be avoided. Downy Free & Gentle liquid, or any liquid fabric softener that is free of dyes  and perfumes, it acceptable to use  9. If your doctor has given you prescription creams you may apply moisturizers over them    If You Need Anything After Your Visit  If you have any questions or concerns for your doctor, please call our main line at [phone] and press option 4 to reach your doctor's medical assistant. If no one answers, please leave a voicemail as directed and we will return your call as soon as possible. Messages left after 4 pm will be answered the following business day.   You may also send [place] a message via [place]. We typically respond to MyChart messages within 1-2 business days.  For prescription refills, please ask your pharmacy to contact our office. Our fax number is [phone].  If you have an urgent issue when the clinic is closed that cannot wait until the next business day, you can page your doctor at the number below.    Please note that while we do our best to be available for urgent issues outside of office hours, we are not available 24/7.   If you have an urgent issue and are unable to reach [place], you may choose to seek medical care at your doctor's office, retail clinic, urgent care center, or emergency room.  If you have a medical emergency, please immediately call 911 or go to the emergency department.  Pager Numbers  - Dr. Nehemiah Massed: [phone]  - Dr. Laurence Ferrari: [phone]  - Dr. Nicole Kindred:  [phone]  In the event of inclement weather, please call our main line at [phone] for an update on the status of any delays or closures.  Dermatology Medication Tips: Please keep the boxes that topical medications come in in order to help keep track of the instructions about where and how to use these. Pharmacies typically print the medication instructions only on the boxes and not directly on the medication tubes.   If your medication is too expensive, please contact our office at [phone] option 4 or send [place] a message through [place].   We  are unable to tell what your co-pay for medications will be in advance as this is different depending on your insurance coverage. However, we may be able to find a substitute medication at lower cost or fill out paperwork to get insurance to cover a needed medication.   If a prior authorization is required to get your medication covered by your insurance company, please allow [place] 1-2 business days to complete this process.  Drug prices often vary depending on where the prescription is filled and some pharmacies may offer cheaper prices.  The website www.goodrx.com contains coupons for medications through different pharmacies. The prices here do not account for what the cost may be with help from insurance (it may be cheaper with your insurance), but the website can give you the price if you did not use any insurance.  - You can print the associated coupon and take it with your prescription to the pharmacy.  - You may also stop by our office during regular business hours and pick up a GoodRx coupon card.  - If you need your prescription sent electronically to a different pharmacy, notify our office through [facility] or by phone at [phone] option 4.     Si Usted Necesita Algo Despus de Su Visita  Tambin puede enviarnos un mensaje a travs de Pharmacist, community. Por lo general respondemos a los mensajes de MyChart en el transcurso de 1 a 2 das hbiles.  Para renovar recetas, por favor pida a su farmacia que se ponga en contacto con nuestra oficina. Harland Dingwall de fax es [place] [phone].  Si tiene un asunto urgente cuando la clnica est cerrada y que no puede esperar hasta el siguiente da hbil, puede llamar/localizar a su doctor(a) al nmero que aparece a continuacin.   Por favor, tenga en cuenta que aunque hacemos todo lo posible para estar disponibles para asuntos urgentes fuera del horario de [place], no estamos disponibles las 24 horas del da, los 7 das de la [place].   Si tiene  un problema urgente y no puede comunicarse con nosotros, puede optar por buscar atencin mdica  en el consultorio de su doctor(a), en una clnica privada, en un centro de atencin urgente o en una sala de emergencias.  Si tiene Engineering geologist, por favor llame inmediatamente al 911 o vaya a la sala de emergencias.  Nmeros de bper  - Dr. Nehemiah Massed: [phone]  - Dra. Moye: [phone]  - Dra. Nicole Kindred: [phone]  En caso de inclemencias del [place], por favor llame a Johnsie Kindred principal al [phone] para una actualizacin sobre el [place] de cualquier retraso o cierre.  Consejos para la medicacin en dermatologa: Por favor, guarde las cajas en las que vienen los medicamentos de uso tpico para ayudarle a seguir las instrucciones sobre dnde y cmo usarlos. Las farmacias generalmente imprimen las instrucciones del medicamento slo en las cajas y no directamente en los tubos del [place].  Si su medicamento es muy caro, por favor, pngase en contacto con Zigmund Daniel llamando al [phone] y presione la opcin 4 o envenos un mensaje a travs de Pharmacist, community.   No podemos decirle cul ser su copago por los medicamentos por adelantado ya que esto es diferente dependiendo de la cobertura de su seguro. Sin embargo, es posible que podamos encontrar un medicamento sustituto a Electrical engineer un formulario para que el seguro cubra el medicamento que se considera necesario.   Si se requiere una autorizacin previa para que su compaa de seguros [place] su medicamento, por favor permtanos de 1 a 2 das hbiles para completar este proceso.  Los precios de los medicamentos varan con frecuencia dependiendo del Environmental consultant de dnde se surte la receta y alguna farmacias pueden ofrecer precios ms baratos.  El sitio web www.goodrx.com tiene cupones para medicamentos de Airline pilot. Los precios aqu no tienen en cuenta lo que podra costar con la ayuda del seguro (puede ser  ms barato con su seguro), pero el sitio web puede darle el precio si no utiliz Research scientist (physical sciences).  - Puede imprimir el cupn correspondiente y llevarlo con su receta a la farmacia.  - Tambin puede pasar por nuestra oficina durante el horario de atencin regular y Charity fundraiser una tarjeta de cupones de GoodRx.  - Si necesita que su receta se enve electrnicamente a una farmacia diferente, informe a nuestra oficina a travs de MyChart de [place] o por telfono llamando al [phone] y presione la opcin 4.

## 2021-07-30 NOTE — Progress Notes (Signed)
° °  Follow-Up Visit   Subjective  Paul Buckley. is a 76 y.o. male who presents for the following: Follow-up.  Patient presents for 1 month eczema follow-up. He had eczema of the lower legs, improved with clobetasol cream. Cleared up and patient is no longer using clobetasol to these areas.  He has new itchy bumps on his arms and lower back. No new soaps, shampoo, lotions, no new foods, no recent travel, no pets. Patient is the only one in the house. He uses [place] soap and no moisturizer. No history of eczema as a child.   The following portions of the chart were reviewed this encounter and updated as appropriate:       Review of Systems:  No other skin or systemic complaints except as noted in HPI or Assessment and Plan.  Objective  Well appearing patient in no apparent distress; mood and affect are within normal limits.  All skin waist up examined.  Right Popliteal, trunk, extremities Violaceous patch with mild scale of the right popliteal; scattered small pink scaly papules of mid and lower back, chest, arms.    Assessment & Plan  Dermatitis Right Popliteal, trunk, extremities  Atopic vrs Nummular with flare  Start Clobetasol Solution mixed in CeraVe Cream - Apply to AA rash BID until improved. Use up to 4 weeks. Avoid face, groin, axilla.   Recommend Dove Soap in the shower, followed by moisturizer. Samples given of Eucerin Advanced repair, Cetaphil Cream, and Dove Body wash.   Topical steroids (such as triamcinolone, fluocinolone, fluocinonide, mometasone, clobetasol, halobetasol, betamethasone, hydrocortisone) can cause thinning and lightening of the skin if they are used for too long in the same area. Your physician has selected the right strength medicine for your problem and area affected on the body. Please use your medication only as directed by your physician to prevent side effects.   Atopic dermatitis (eczema) is a chronic, relapsing, pruritic condition  that can significantly affect quality of life. It is often associated with allergic rhinitis and/or asthma and can require treatment with topical medications, phototherapy, or in severe cases biologic injectable medication (Dupixent; Adbry) or Oral JAK inhibitors.   clobetasol (TEMOVATE) 0.05 % external solution - Right Popliteal, trunk, extremities Patient to mix solution in jar of CeraVe Cream. Apply to affected areas rash twice daily until rash improved. Use up to 4 weeks.   Return in about 1 month (around 08/30/2021) for eczema.  IJamesetta Orleans, CMA, am acting as scribe for Brendolyn Patty, MD .   Documentation: I have reviewed the above documentation for accuracy and completeness, and I agree with the above.  Brendolyn Patty MD

## 2021-08-06 ENCOUNTER — Ambulatory Visit (INDEPENDENT_AMBULATORY_CARE_PROVIDER_SITE_OTHER): Payer: Medicare Other | Admitting: Internal Medicine

## 2021-08-06 ENCOUNTER — Encounter: Payer: Self-pay | Admitting: Internal Medicine

## 2021-08-06 ENCOUNTER — Other Ambulatory Visit: Payer: Self-pay

## 2021-08-06 VITALS — BP 135/79 | HR 82 | Ht 72.0 in | Wt 247.2 lb

## 2021-08-06 DIAGNOSIS — J301 Allergic rhinitis due to pollen: Secondary | ICD-10-CM | POA: Diagnosis not present

## 2021-08-06 DIAGNOSIS — E785 Hyperlipidemia, unspecified: Secondary | ICD-10-CM | POA: Diagnosis not present

## 2021-08-06 DIAGNOSIS — I83893 Varicose veins of bilateral lower extremities with other complications: Secondary | ICD-10-CM | POA: Diagnosis not present

## 2021-08-06 DIAGNOSIS — I10 Essential (primary) hypertension: Secondary | ICD-10-CM

## 2021-08-06 NOTE — Progress Notes (Signed)
Established Patient Office Visit  Subjective:  Patient ID: Paul Budhu., male    DOB: 1945/07/27  Age: 76 y.o. MRN: [id number]  CC:  Chief Complaint  Patient presents with   Follow-up    Patient is here for his 3 month follow up for general check up.    HPI  Paul Hews. presents for insomnia  Past Medical History:  Diagnosis Date   Colon, diverticulosis    GERD (gastroesophageal reflux disease)    History of colonic polyps    Internal hemorrhoids    Pneumonia     Past Surgical History:  Procedure Laterality Date   APPENDECTOMY  1963   COLONOSCOPY WITH PROPOFOL N/A 04/25/2018   Procedure: COLONOSCOPY WITH PROPOFOL;  Surgeon: Lollie Sails, MD;  Location: [facility] ENDOSCOPY;  Service: Endoscopy;  Laterality: N/A;   EYE SURGERY  2013   HERNIA REPAIR  1986    Family History  Problem Relation Age of Onset   Cancer Mother    Cancer Father    Cancer Maternal Uncle    Cancer Paternal Uncle    Heart attack Paternal Grandfather     Social History   Socioeconomic History   Marital status: Married    Spouse name: Not on file   Number of children: Not on file   Years of education: Not on file   Highest education level: Not on file  Occupational History   Not on file  Tobacco Use   Smoking status: Former    Years: 50.00    Types: Cigarettes    Quit date: 07/14/1967    Years since quitting: 54.1   Smokeless tobacco: Never  Vaping Use   Vaping Use: Never used  Substance and Sexual Activity   Alcohol use: Yes   Drug use: No   Sexual activity: Not on file  Other Topics Concern   Not on file  Social History Narrative   Not on file   Social Determinants of Health   Financial Resource Strain: Not on file  Food Insecurity: Not on file  Transportation Needs: Not on file  Physical Activity: Not on file  Stress: Not on file  Social Connections: Not on file  Intimate Partner Violence: Not on file     Current Outpatient Medications:    aspirin EC  81 MG tablet, Take 81 mg by mouth daily. Swallow whole., Disp: , Rfl:    clobetasol (TEMOVATE) 0.05 % external solution, Patient to mix solution in jar of CeraVe Cream. Apply to affected areas rash twice daily until rash improved. Use up to 4 weeks., Disp: 50 mL, Rfl: 1   losartan (COZAAR) 100 MG tablet, TAKE 1 TABLET BY MOUTH EVERY DAY, Disp: 90 tablet, Rfl: 3   mupirocin ointment (BACTROBAN) 2 %, Apply 1 application topically daily., Disp: 22 g, Rfl: 0  Current Facility-Administered Medications:    betamethasone acetate-betamethasone sodium phosphate (CELESTONE) injection 3 mg, 3 mg, Intramuscular, Once, Evans, Brent M, DPM   No Known Allergies  ROS Review of Systems  Constitutional: Negative.   HENT: Negative.    Eyes: Negative.   Respiratory: Negative.    Cardiovascular: Negative.   Gastrointestinal: Negative.   Endocrine: Negative.   Genitourinary: Negative.   Musculoskeletal: Negative.   Skin: Negative.   Allergic/Immunologic: Negative.   Neurological: Negative.   Hematological: Negative.   Psychiatric/Behavioral: Negative.    All other systems reviewed and are negative.    Objective:    Physical Exam Vitals reviewed.  Constitutional:  Appearance: Normal appearance.  HENT:     Mouth/Throat:     Mouth: Mucous membranes are moist.  Eyes:     Pupils: Pupils are equal, round, and reactive to light.  Neck:     Vascular: No carotid bruit.  Cardiovascular:     Rate and Rhythm: Normal rate and regular rhythm.     Pulses: Normal pulses.     Heart sounds: Normal heart sounds.  Pulmonary:     Effort: Pulmonary effort is normal.     Breath sounds: Normal breath sounds.  Abdominal:     General: Bowel sounds are normal.     Palpations: Abdomen is soft. There is no hepatomegaly, splenomegaly or mass.     Tenderness: There is no abdominal tenderness.     Hernia: No hernia is present.  Musculoskeletal:     Cervical back: Neck supple.     Right lower leg: No edema.      Left lower leg: No edema.  Skin:    Findings: No rash.  Neurological:     Mental Status: He is alert and oriented to person, place, and time.     Motor: No weakness.  Psychiatric:        Mood and Affect: Mood normal.        Behavior: Behavior normal.    BP 135/79    Pulse 82    Ht 6' (1.829 m)    Wt 247 lb 3.2 oz (112.1 kg)    BMI 33.53 kg/m  Wt Readings from Last 3 Encounters:  08/06/21 247 lb 3.2 oz (112.1 kg)  05/06/21 246 lb (111.6 kg)  04/08/21 242 lb 12.8 oz (110.1 kg)     Health Maintenance Due  Topic Date Due   COVID-19 Vaccine (3 - Booster for Moderna series) 08/12/2020    There are no preventive care reminders to display for this patient.  Lab Results  Component Value Date   TSH 2.14 04/29/2021   Lab Results  Component Value Date   WBC 4.8 04/29/2021   HGB 13.5 04/29/2021   HCT 39.2 04/29/2021   MCV 95.1 04/29/2021   PLT 287 04/29/2021   Lab Results  Component Value Date   NA 141 04/29/2021   K 4.3 04/29/2021   CO2 26 04/29/2021   GLUCOSE 98 04/29/2021   BUN 17 04/29/2021   CREATININE 0.95 04/29/2021   BILITOT 0.4 04/29/2021   ALKPHOS 55 01/25/2021   AST 16 04/29/2021   ALT 11 04/29/2021   PROT 6.3 04/29/2021   ALBUMIN 3.6 01/25/2021   CALCIUM 8.6 04/29/2021   ANIONGAP 8 01/25/2021   EGFR 83 04/29/2021   Lab Results  Component Value Date   CHOL 135 04/29/2021   Lab Results  Component Value Date   HDL 39 (L) 04/29/2021   Lab Results  Component Value Date   LDLCALC 82 04/29/2021   Lab Results  Component Value Date   TRIG 61 04/29/2021   Lab Results  Component Value Date   CHOLHDL 3.5 04/29/2021   No results found for: HGBA1C    Assessment & Plan:   Problem List Items Addressed This Visit       Cardiovascular and Mediastinum   Varicose veins of bilateral lower extremities with other complications - Primary    Stable at the present time      Essential hypertension     Patient denies any chest pain or shortness of breath  there is no history of palpitation or paroxysmal nocturnal dyspnea  patient was advised to follow low-salt low-cholesterol diet    ideally I want to keep systolic blood pressure below 130 mmHg, patient was asked to check blood pressure one times a week and give me a report on that.  Patient will be follow-up in 3 months  or earlier as needed, patient will call me back for any change in the cardiovascular symptoms Patient was advised to buy a book from local bookstore concerning blood pressure and read several chapters  every day.  This will be supplemented by some of the material we will give him from the office.  Patient should also utilize other resources like YouTube and Internet to learn more about the blood pressure and the diet.        Respiratory   Seasonal allergic rhinitis due to pollen    Take Claritin 5 mg daily        Other   Hyperlipidemia    Hypercholesterolemia  I advised the patient to follow Mediterranean diet This diet is rich in fruits vegetables and whole grain, and This diet is also rich in fish and lean meat Patient should also eat a handful of almonds or walnuts daily Recent heart study indicated that average follow-up on this kind of diet reduces the cardiovascular mortality by 50 to 70%==       No orders of the defined types were placed in this encounter.   Follow-up: No follow-ups on file.    Cletis Athens, MD

## 2021-08-06 NOTE — Assessment & Plan Note (Signed)
Take Claritin 5 mg daily

## 2021-08-06 NOTE — Assessment & Plan Note (Signed)
Stable at the present time. 

## 2021-08-06 NOTE — Assessment & Plan Note (Signed)

## 2021-08-06 NOTE — Assessment & Plan Note (Signed)
Hypercholesterolemia  I advised the patient to follow Mediterranean diet This diet is rich in fruits vegetables and whole grain, and This diet is also rich in fish and lean meat Patient should also eat a handful of almonds or walnuts daily Recent heart study indicated that average follow-up on this kind of diet reduces the cardiovascular mortality by 50 to 70%== 

## 2021-08-07 DIAGNOSIS — M5136 Other intervertebral disc degeneration, lumbar region: Secondary | ICD-10-CM | POA: Diagnosis not present

## 2021-08-07 DIAGNOSIS — M955 Acquired deformity of pelvis: Secondary | ICD-10-CM | POA: Diagnosis not present

## 2021-08-07 DIAGNOSIS — M9903 Segmental and somatic dysfunction of lumbar region: Secondary | ICD-10-CM | POA: Diagnosis not present

## 2021-08-07 DIAGNOSIS — M9905 Segmental and somatic dysfunction of pelvic region: Secondary | ICD-10-CM | POA: Diagnosis not present

## 2021-09-03 ENCOUNTER — Ambulatory Visit (INDEPENDENT_AMBULATORY_CARE_PROVIDER_SITE_OTHER): Payer: Medicare Other | Admitting: Dermatology

## 2021-09-03 ENCOUNTER — Other Ambulatory Visit: Payer: Self-pay

## 2021-09-03 DIAGNOSIS — L821 Other seborrheic keratosis: Secondary | ICD-10-CM

## 2021-09-03 DIAGNOSIS — L820 Inflamed seborrheic keratosis: Secondary | ICD-10-CM | POA: Diagnosis not present

## 2021-09-03 DIAGNOSIS — L578 Other skin changes due to chronic exposure to nonionizing radiation: Secondary | ICD-10-CM | POA: Diagnosis not present

## 2021-09-03 DIAGNOSIS — L309 Dermatitis, unspecified: Secondary | ICD-10-CM

## 2021-09-03 NOTE — Progress Notes (Signed)
Follow-Up Visit   Subjective  Paul Buckley. is a 76 y.o. male who presents for the following: Dermatitis (Atopic vs Nummular, trunk, extremities, 83m f/u, d/c Clobetasol/cerave mix, improved) and check spot (R neck, has had a while, picks at).  The patient has spots, moles and lesions to be evaluated, some may be new or changing and the patient has concerns that these could be cancer.   The following portions of the chart were reviewed this encounter and updated as appropriate:       Review of Systems:  No other skin or systemic complaints except as noted in HPI or Assessment and Plan.  Objective  Well appearing patient in no apparent distress; mood and affect are within normal limits.  A focused examination was performed including trunk, arms, legs. Relevant physical exam findings are noted in the Assessment and Plan.  trunk, extremities Back, arms clear Pink scaly patches R lower leg >  L lower leg  R inf jaw at neck x 1 Pink scaly flat pap 2.0 x 1.0cm    Assessment & Plan   Actinic Damage - chronic, secondary to cumulative UV radiation exposure/sun exposure over time - diffuse scaly erythematous macules with underlying dyspigmentation - Recommend daily broad spectrum sunscreen SPF 30+ to sun-exposed areas, reapply every 2 hours as needed.  - Recommend staying in the shade or wearing long sleeves, sun glasses (UVA+UVB protection) and wide brim hats (4-inch brim around the entire circumference of the hat). - Call for new or changing lesions.  - arms  Seborrheic Keratoses - Stuck-on, waxy, tan-brown papules and/or plaques  - Benign-appearing - Discussed benign etiology and prognosis. - Observe - Call for any changes - spinal lower back  Dermatitis trunk, extremities  Atopic vs Nummular- Improving  Atopic dermatitis (eczema) is a chronic, relapsing, pruritic condition that can significantly affect quality of life. It is often associated with allergic rhinitis  and/or asthma and can require treatment with topical medications, phototherapy, or in severe cases biologic injectable medication (Dupixent; Adbry) or Oral JAK inhibitors.   Cont Clobetasol solution/Cerave cream bid until clear up to 2 more weeks on legs, then prn flares, avoid f/g/a Then cont Cerave cream qd Cont Dove soap  Topical steroids (such as triamcinolone, fluocinolone, fluocinonide, mometasone, clobetasol, halobetasol, betamethasone, hydrocortisone) can cause thinning and lightening of the skin if they are used for too long in the same area. Your physician has selected the right strength medicine for your problem and area affected on the body. Please use your medication only as directed by your physician to prevent side effects.    Related Medications clobetasol (TEMOVATE) 0.05 % external solution Patient to mix solution in jar of CeraVe Cream. Apply to affected areas rash twice daily until rash improved. Use up to 4 weeks.  Inflamed seborrheic keratosis R inf jaw at neck x 1  Recheck on f/u  Destruction of lesion - R inf jaw at neck x 1  Destruction method: cryotherapy   Informed consent: discussed and consent obtained   Lesion destroyed using liquid nitrogen: Yes   Region frozen until ice ball extended beyond lesion: Yes   Outcome: patient tolerated procedure well with no complications   Post-procedure details: wound care instructions given   Additional details:  Prior to procedure, discussed risks of blister formation, small wound, skin dyspigmentation, or rare scar following cryotherapy. Recommend Vaseline ointment to treated areas while healing.    Return in about 2 months (around 11/01/2021) for recheck ISK R  inf jaw at neck, Dermatitis f/u.  I, Othelia Pulling, RMA, am acting as scribe for Brendolyn Patty, MD .  Documentation: I have reviewed the above documentation for accuracy and completeness, and I agree with the above.  Brendolyn Patty MD

## 2021-09-03 NOTE — Patient Instructions (Addendum)
Continue Clobetasol solution/Cerave cream twice a day until clear up to 2 weeks, then as needed flares, avoid face, groin, axilla Continue Cerave cream daily to body Continue Dove soap   If You Need Anything After Your Visit  If you have any questions or concerns for your doctor, please call our main line at [phone] and press option 4 to reach your doctor's medical assistant. If no one answers, please leave a voicemail as directed and we will return your call as soon as possible. Messages left after 4 pm will be answered the following business day.   You may also send [place] a message via [place]. We typically respond to MyChart messages within 1-2 business days.  For prescription refills, please ask your pharmacy to contact our office. Our fax number is [phone].  If you have an urgent issue when the clinic is closed that cannot wait until the next business day, you can page your doctor at the number below.    Please note that while we do our best to be available for urgent issues outside of office hours, we are not available 24/7.   If you have an urgent issue and are unable to reach [place], you may choose to seek medical care at your doctor's office, retail clinic, urgent care center, or emergency room.  If you have a medical emergency, please immediately call 911 or go to the emergency department.  Pager Numbers  - Dr. Nehemiah Massed: [phone]  - Dr. Laurence Ferrari: [phone]  - Dr. Nicole Kindred: [phone]  In the event of inclement weather, please call our main line at [phone] for an update on the status of any delays or closures.  Dermatology Medication Tips: Please keep the boxes that topical medications come in in order to help keep track of the instructions about where and how to use these. Pharmacies typically print the medication instructions only on the boxes and not directly on the medication tubes.   If your medication is too expensive, please contact our office at  [phone] option 4 or send [place] a message through [place].   We are unable to tell what your co-pay for medications will be in advance as this is different depending on your insurance coverage. However, we may be able to find a substitute medication at lower cost or fill out paperwork to get insurance to cover a needed medication.   If a prior authorization is required to get your medication covered by your insurance company, please allow [place] 1-2 business days to complete this process.  Drug prices often vary depending on where the prescription is filled and some pharmacies may offer cheaper prices.  The website www.goodrx.com contains coupons for medications through different pharmacies. The prices here do not account for what the cost may be with help from insurance (it may be cheaper with your insurance), but the website can give you the price if you did not use any insurance.  - You can print the associated coupon and take it with your prescription to the pharmacy.  - You may also stop by our office during regular business hours and pick up a GoodRx coupon card.  - If you need your prescription sent electronically to a different pharmacy, notify our office through [facility] or by phone at [phone] option 4.     Si Usted Necesita Algo Despus de Su Visita  Tambin puede enviarnos un mensaje a travs de Pharmacist, community. Por lo general respondemos a los mensajes de MyChart en el transcurso de 1  a 2 das hbiles.  Para renovar recetas, por favor pida a su farmacia que se ponga en contacto con nuestra oficina. Harland Dingwall de fax es [place] [phone].  Si tiene un asunto urgente cuando la clnica est cerrada y que no puede esperar hasta el siguiente da hbil, puede llamar/localizar a su doctor(a) al nmero que aparece a continuacin.   Por favor, tenga en cuenta que aunque hacemos todo lo posible para estar disponibles para asuntos urgentes fuera del horario de [place], no estamos  disponibles las 24 horas del da, los 7 das de la [place].   Si tiene un problema urgente y no puede comunicarse con nosotros, puede optar por buscar atencin mdica  en el consultorio de su doctor(a), en una clnica privada, en un centro de atencin urgente o en una sala de emergencias.  Si tiene Engineering geologist, por favor llame inmediatamente al 911 o vaya a la sala de emergencias.  Nmeros de bper  - Dr. Nehemiah Massed: [phone]  - Dra. Moye: [phone]  - Dra. Nicole Kindred: [phone]  En caso de inclemencias del [place], por favor llame a Johnsie Kindred principal al [phone] para una actualizacin sobre el [place] de cualquier retraso o cierre.  Consejos para la medicacin en dermatologa: Por favor, guarde las cajas en las que vienen los medicamentos de uso tpico para ayudarle a seguir las instrucciones sobre dnde y cmo usarlos. Las farmacias generalmente imprimen las instrucciones del medicamento slo en las cajas y no directamente en los tubos del [place].   Si su medicamento es muy caro, por favor, pngase en contacto con Zigmund Daniel llamando al [phone] y presione la opcin 4 o envenos un mensaje a travs de Pharmacist, community.   No podemos decirle cul ser su copago por los medicamentos por adelantado ya que esto es diferente dependiendo de la cobertura de su seguro. Sin embargo, es posible que podamos encontrar un medicamento sustituto a Electrical engineer un formulario para que el seguro cubra el medicamento que se considera necesario.   Si se requiere una autorizacin previa para que su compaa de seguros [place] su medicamento, por favor permtanos de 1 a 2 das hbiles para completar este proceso.  Los precios de los medicamentos varan con frecuencia dependiendo del Environmental consultant de dnde se surte la receta y alguna farmacias pueden ofrecer precios ms baratos.  El sitio web www.goodrx.com tiene cupones para medicamentos de Airline pilot. Los precios aqu no  tienen en cuenta lo que podra costar con la ayuda del seguro (puede ser ms barato con su seguro), pero el sitio web puede darle el precio si no utiliz Research scientist (physical sciences).  - Puede imprimir el cupn correspondiente y llevarlo con su receta a la farmacia.  - Tambin puede pasar por nuestra oficina durante el horario de atencin regular y Charity fundraiser una tarjeta de cupones de GoodRx.  - Si necesita que su receta se enve electrnicamente a una farmacia diferente, informe a nuestra oficina a travs de MyChart de [place] o por telfono llamando al [phone] y presione la opcin 4.

## 2021-09-04 DIAGNOSIS — M5136 Other intervertebral disc degeneration, lumbar region: Secondary | ICD-10-CM | POA: Diagnosis not present

## 2021-09-04 DIAGNOSIS — M955 Acquired deformity of pelvis: Secondary | ICD-10-CM | POA: Diagnosis not present

## 2021-09-04 DIAGNOSIS — M9905 Segmental and somatic dysfunction of pelvic region: Secondary | ICD-10-CM | POA: Diagnosis not present

## 2021-09-04 DIAGNOSIS — M9903 Segmental and somatic dysfunction of lumbar region: Secondary | ICD-10-CM | POA: Diagnosis not present

## 2021-09-18 ENCOUNTER — Ambulatory Visit (INDEPENDENT_AMBULATORY_CARE_PROVIDER_SITE_OTHER): Payer: Medicare Other | Admitting: Podiatry

## 2021-09-18 ENCOUNTER — Encounter: Payer: Self-pay | Admitting: Podiatry

## 2021-09-18 ENCOUNTER — Other Ambulatory Visit: Payer: Self-pay

## 2021-09-18 DIAGNOSIS — M79675 Pain in left toe(s): Secondary | ICD-10-CM | POA: Diagnosis not present

## 2021-09-18 DIAGNOSIS — I872 Venous insufficiency (chronic) (peripheral): Secondary | ICD-10-CM | POA: Diagnosis not present

## 2021-09-18 DIAGNOSIS — M79674 Pain in right toe(s): Secondary | ICD-10-CM | POA: Diagnosis not present

## 2021-09-18 DIAGNOSIS — B351 Tinea unguium: Secondary | ICD-10-CM | POA: Diagnosis not present

## 2021-09-18 NOTE — Progress Notes (Signed)
This patient returns to my office for at risk foot care.  This patient requires this care by a professional since this patient will be at risk due to having venous insufficiency.  This patient is unable to cut nails easily  himself since the patient cannot reach his nails.These nails are painful walking and wearing shoes.  This patient presents for at risk foot care today. ? ?General Appearance  Alert, conversant and in no acute stress. ? ?Vascular  Dorsalis pedis and posterior tibial  pulses are palpable  bilaterally.  Capillary return is within normal limits  bilaterally. Temperature is within normal limits  bilaterally. ? ?Neurologic  Senn-Weinstein monofilament wire test within normal limits  bilaterally. Muscle power within normal limits bilaterally. ? ?Nails Thick disfigured discolored nails with subungual debris  from hallux to fifth toes bilaterally. No evidence of bacterial infection or drainage bilaterally. ? ?Orthopedic  No limitations of motion  feet .  No crepitus or effusions noted.  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   ? ?Skin  normotropic skin with no porokeratosis noted bilaterally.  No signs of infections or ulcers noted.    ? ?Onychomycosis  Pain in right toes  Pain in left toes ? ?Consent was obtained for treatment procedures.   Mechanical debridement of nails 1-5  bilaterally performed with a nail nipper.  Filed with dremel without incident. Multiple insect bites on lower legs. ? ? ?Return office visit   3 months.                  Told patient to return for periodic foot care and evaluation due to potential at risk complications.   ? ? ?Gardiner Barefoot DPM  ?

## 2021-09-25 DIAGNOSIS — M9903 Segmental and somatic dysfunction of lumbar region: Secondary | ICD-10-CM | POA: Diagnosis not present

## 2021-09-25 DIAGNOSIS — M9905 Segmental and somatic dysfunction of pelvic region: Secondary | ICD-10-CM | POA: Diagnosis not present

## 2021-09-25 DIAGNOSIS — M955 Acquired deformity of pelvis: Secondary | ICD-10-CM | POA: Diagnosis not present

## 2021-09-25 DIAGNOSIS — M5136 Other intervertebral disc degeneration, lumbar region: Secondary | ICD-10-CM | POA: Diagnosis not present

## 2021-10-10 DIAGNOSIS — S83241D Other tear of medial meniscus, current injury, right knee, subsequent encounter: Secondary | ICD-10-CM | POA: Diagnosis not present

## 2021-10-23 DIAGNOSIS — M9905 Segmental and somatic dysfunction of pelvic region: Secondary | ICD-10-CM | POA: Diagnosis not present

## 2021-10-23 DIAGNOSIS — M955 Acquired deformity of pelvis: Secondary | ICD-10-CM | POA: Diagnosis not present

## 2021-10-23 DIAGNOSIS — M5136 Other intervertebral disc degeneration, lumbar region: Secondary | ICD-10-CM | POA: Diagnosis not present

## 2021-10-23 DIAGNOSIS — M9903 Segmental and somatic dysfunction of lumbar region: Secondary | ICD-10-CM | POA: Diagnosis not present

## 2021-11-03 ENCOUNTER — Ambulatory Visit (INDEPENDENT_AMBULATORY_CARE_PROVIDER_SITE_OTHER): Payer: Medicare Other | Admitting: Dermatology

## 2021-11-03 DIAGNOSIS — Z872 Personal history of diseases of the skin and subcutaneous tissue: Secondary | ICD-10-CM | POA: Diagnosis not present

## 2021-11-03 DIAGNOSIS — L2081 Atopic neurodermatitis: Secondary | ICD-10-CM

## 2021-11-03 MED ORDER — CLOBETASOL PROPIONATE 0.05 % EX CREA: 1.0000 "application " | TOPICAL_CREAM | CUTANEOUS | 0 refills | Status: DC

## 2021-11-03 NOTE — Progress Notes (Signed)
? ?  Follow-Up Visit ?  ?Subjective  ?Paul Buckley. is a 76 y.o. male who presents for the following: Atopic vs Nummular derm (Trunk, extremities, 32mf/u Clobetasol/cerave mix prn, Clobetasol cr) and ISK f/u (R inf jaw at neck, 238m/u). ? ? ?The following portions of the chart were reviewed this encounter and updated as appropriate:  ?  ?  ? ?Review of Systems:  No other skin or systemic complaints except as noted in HPI or Assessment and Plan. ? ?Objective  ?Well appearing patient in no apparent distress; mood and affect are within normal limits. ? ?A focused examination was performed including bil legs, face. Relevant physical exam findings are noted in the Assessment and Plan. ? ?bil popliteal fossa ?Pink scaly patch L popliteal, hyperpigmented patch R popliteal ?Arms and trunk are clear ? ? ? ?Assessment & Plan  ?Atopic neurodermatitis ?bil popliteal fossa ? ?Atopic vs Nummular Derm- improved. ? ?Atopic dermatitis (eczema) is a chronic, relapsing, pruritic condition that can significantly affect quality of life. It is often associated with allergic rhinitis and/or asthma and can require treatment with topical medications, phototherapy, or in severe cases biologic injectable medication (Dupixent; Adbry) or Oral JAK inhibitors.  ? ?Cont Clobetasol cr qd/bid to more severe areas, aa itchy rash prn flares, avoid f/g/a ?Cont Clobetasol/cerave cream qd/bid aa itchy rash prn flares ? ? ?Recommend mild soap and moisturizing cream 1-2 times daily.  Cont Cerave cream qd.  Gentle skin care handout provided.   ? ?Topical steroids (such as triamcinolone, fluocinolone, fluocinonide, mometasone, clobetasol, halobetasol, betamethasone, hydrocortisone) can cause thinning and lightening of the skin if they are used for too long in the same area. Your physician has selected the right strength medicine for your problem and area affected on the body. Please use your medication only as directed by your physician to prevent side  effects.   ? ?clobetasol cream (TEMOVATE) 0.05 % - bil popliteal fossa ?Apply 1 application. topically as directed. Qd to bid aa itchy rash/eczema on body prn flares, avoid face, groin, axilla ? ? ?Hx of ISK  ?-R inf jaw at neck, clear on exam today, resolved with LN2 ? ?Return if symptoms worsen or fail to improve. ? ?I, SoOthelia PullingRMA, am acting as scribe for TaBrendolyn PattyMD . ? ?Documentation: I have reviewed the above documentation for accuracy and completeness, and I agree with the above. ? ?TaBrendolyn PattyD  ? ?

## 2021-11-03 NOTE — Patient Instructions (Addendum)
Dry Skin Care ? ?What causes dry skin? ? ?Dry skin is common and results from inadequate moisture in the outer skin layers. Dry skin usually results from the excessive loss of moisture from the skin surface. This occurs due to two major factors: ?Normally the skin's oil glands deposit a layer of oil on the skin's surface. This layer of oil prevents the loss of moisture from the skin. Exposure to soaps, cleaners, solvents, and disinfectants removes this oily film, allowing water to escape. ?Water loss from the skin increases when the humidity is low. During winter months we spend a lot of time indoors where the air is heated. Heated air has very low humidity. This also contributes to dry skin. ? ?A tendency for dry skin may accompany such disorders as eczema. Also, as people age, the number of functioning oil glands decreases, and the tendency toward dry skin can be a sensation of skin tightness when emerging from the shower. ? ?How do I manage dry skin? ? ?Humidify your environment. This can be accomplished by using a humidifier in your bedroom at night during winter months. ?Bathing can actually put moisture back into your skin if done right. Take the following steps while bathing to sooth dry skin: ?Avoid hot water, which only dries the skin and makes itching worse. Use warm water. ?Avoid washcloths or extensive rubbing or scrubbing. ?Use mild soaps like unscented Dove, Oil of Olay, Cetaphil, Basis, or CeraVe. ?If you take baths rather than showers, rinse off soap residue with clean water before getting out of tub. ?Once out of the shower/tub, pat dry gently with a soft towel. Leave your skin damp. ?While still damp, apply any medicated ointment/cream you were prescribed to the affected areas. After you apply your medicated ointment/cream, then apply your moisturizer to your whole body.This is the most important step in dry skin care. If this is omitted, your skin will continue to be dry. ?The choice of  moisturizer is also very important. In general, lotion will not provider enough moisture to severely dry skin because it is water based. You should use an ointment or cream. Moisturizers should also be unscented. Good choices include Vaseline (plain petrolatum), Aquaphor, Cetaphil, CeraVe, Vanicream, DML Forte, Aveeno moisture, or Eucerin Cream. ?Bath oils can be helpful, but do not replace the application of moisturizer after the bath. In addition, they make the tub slippery causing an increased risk for falls. Therefore, we do not recommend their use.Dry Skin Care ? ?What causes dry skin? ? ?Dry skin is common and results from inadequate moisture in the outer skin layers. Dry skin usually results from the excessive loss of moisture from the skin surface. This occurs due to two major factors: ?Normally the skin's oil glands deposit a layer of oil on the skin's surface. This layer of oil prevents the loss of moisture from the skin. Exposure to soaps, cleaners, solvents, and disinfectants removes this oily film, allowing water to escape. ?Water loss from the skin increases when the humidity is low. During winter months we spend a lot of time indoors where the air is heated. Heated air has very low humidity. This also contributes to dry skin. ? ?A tendency for dry skin may accompany such disorders as eczema. Also, as people age, the number of functioning oil glands decreases, and the tendency toward dry skin can be a sensation of skin tightness when emerging from the shower. ? ?How do I manage dry skin? ? ?Humidify your environment. This can be accomplished  by using a humidifier in your bedroom at night during winter months. ?Bathing can actually put moisture back into your skin if done right. Take the following steps while bathing to sooth dry skin: ?Avoid hot water, which only dries the skin and makes itching worse. Use warm water. ?Avoid washcloths or extensive rubbing or scrubbing. ?Use mild soaps like unscented  Dove, Oil of Olay, Cetaphil, Basis, or CeraVe. ?If you take baths rather than showers, rinse off soap residue with clean water before getting out of tub. ?Once out of the shower/tub, pat dry gently with a soft towel. Leave your skin damp. ?While still damp, apply any medicated ointment/cream you were prescribed to the affected areas. After you apply your medicated ointment/cream, then apply your moisturizer to your whole body.This is the most important step in dry skin care. If this is omitted, your skin will continue to be dry. ?The choice of moisturizer is also very important. In general, lotion will not provider enough moisture to severely dry skin because it is water based. You should use an ointment or cream. Moisturizers should also be unscented. Good choices include Vaseline (plain petrolatum), Aquaphor, Cetaphil, CeraVe, Vanicream, DML Forte, Aveeno moisture, or Eucerin Cream. ?Bath oils can be helpful, but do not replace the application of moisturizer after the bath. In addition, they make the tub slippery causing an increased risk for falls. Therefore, we do not recommend their use. ? ? ? ?If You Need Anything After Your Visit ? ?If you have any questions or concerns for your doctor, please call our main line at [phone] and press option 4 to reach your doctor's medical assistant. If no one answers, please leave a voicemail as directed and we will return your call as soon as possible. Messages left after 4 pm will be answered the following business day.  ? ?You may also send [place] a message via MyChart. We typically respond to MyChart messages within 1-2 business days. ? ?For prescription refills, please ask your pharmacy to contact our office. Our fax number is [phone]. ? ?If you have an urgent issue when the clinic is closed that cannot wait until the next business day, you can page your doctor at the number below.   ? ?Please note that while we do our best to be available for urgent issues outside  of office hours, we are not available 24/7.  ? ?If you have an urgent issue and are unable to reach [place], you may choose to seek medical care at your doctor's office, retail clinic, urgent care center, or emergency room. ? ?If you have a medical emergency, please immediately call 911 or go to the emergency department. ? ?Pager Numbers ? ?- Dr. Nehemiah Massed: [phone] ? ?- Dr. Laurence Ferrari: [phone] ? ?- Dr. Nicole Kindred: [phone] ? ?In the event of inclement weather, please call our main line at [phone] for an update on the status of any delays or closures. ? ?Dermatology Medication Tips: ?Please keep the boxes that topical medications come in in order to help keep track of the instructions about where and how to use these. Pharmacies typically print the medication instructions only on the boxes and not directly on the medication tubes.  ? ?If your medication is too expensive, please contact our office at [phone] option 4 or send [place] a message through [place].  ? ?We are unable to tell what your co-pay for medications will be in advance as this is different depending on your insurance coverage. However, we may be able to  find a substitute medication at lower cost or fill out paperwork to get insurance to cover a needed medication.  ? ?If a prior authorization is required to get your medication covered by your insurance company, please allow [place] 1-2 business days to complete this process. ? ?Drug prices often vary depending on where the prescription is filled and some pharmacies may offer cheaper prices. ? ?The website www.goodrx.com contains coupons for medications through different pharmacies. The prices here do not account for what the cost may be with help from insurance (it may be cheaper with your insurance), but the website can give you the price if you did not use any insurance.  ?- You can print the associated coupon and take it with your prescription to the pharmacy.  ?- You may also stop by our office  during regular business hours and pick up a GoodRx coupon card.  ?- If you need your prescription sent electronically to a different pharmacy, notify our office through [facility] or by phone at [phone]

## 2021-11-04 ENCOUNTER — Ambulatory Visit (INDEPENDENT_AMBULATORY_CARE_PROVIDER_SITE_OTHER): Payer: Medicare Other | Admitting: Internal Medicine

## 2021-11-04 ENCOUNTER — Encounter: Payer: Self-pay | Admitting: Internal Medicine

## 2021-11-04 VITALS — BP 140/74 | HR 67 | Ht 72.0 in | Wt 248.4 lb

## 2021-11-04 DIAGNOSIS — I10 Essential (primary) hypertension: Secondary | ICD-10-CM | POA: Diagnosis not present

## 2021-11-04 DIAGNOSIS — M25661 Stiffness of right knee, not elsewhere classified: Secondary | ICD-10-CM | POA: Diagnosis not present

## 2021-11-04 DIAGNOSIS — E785 Hyperlipidemia, unspecified: Secondary | ICD-10-CM | POA: Diagnosis not present

## 2021-11-04 DIAGNOSIS — M25561 Pain in right knee: Secondary | ICD-10-CM | POA: Diagnosis not present

## 2021-11-04 DIAGNOSIS — M720 Palmar fascial fibromatosis [Dupuytren]: Secondary | ICD-10-CM

## 2021-11-04 DIAGNOSIS — J301 Allergic rhinitis due to pollen: Secondary | ICD-10-CM

## 2021-11-04 DIAGNOSIS — I83893 Varicose veins of bilateral lower extremities with other complications: Secondary | ICD-10-CM | POA: Diagnosis not present

## 2021-11-04 NOTE — Assessment & Plan Note (Signed)
There is no swelling dermatitis I asked him to use support hose ?

## 2021-11-04 NOTE — Assessment & Plan Note (Signed)
Take Claritin 10 mg p.o. as needed 

## 2021-11-04 NOTE — Assessment & Plan Note (Signed)

## 2021-11-04 NOTE — Assessment & Plan Note (Signed)
Stable at the present time. 

## 2021-11-04 NOTE — Assessment & Plan Note (Signed)
Hypercholesterolemia  I advised the patient to follow Mediterranean diet This diet is rich in fruits vegetables and whole grain, and This diet is also rich in fish and lean meat Patient should also eat a handful of almonds or walnuts daily Recent heart study indicated that average follow-up on this kind of diet reduces the cardiovascular mortality by 50 to 70%== 

## 2021-11-04 NOTE — Progress Notes (Signed)
? ?Established Patient Office Visit ? ?Subjective:  ?Patient ID: Paul Hews., male    DOB: 05-30-1946  Age: 76 y.o. MRN: [id number] ? ?CC:  ?Chief Complaint  ?Patient presents with  ? Follow-up  ? ? ?HPI ? ?Paul Hews. presents for general check ? ?Past Medical History:  ?Diagnosis Date  ? Colon, diverticulosis   ? GERD (gastroesophageal reflux disease)   ? History of colonic polyps   ? Internal hemorrhoids   ? Pneumonia   ? ? ?Past Surgical History:  ?Procedure Laterality Date  ? APPENDECTOMY  1963  ? COLONOSCOPY WITH PROPOFOL N/A 04/25/2018  ? Procedure: COLONOSCOPY WITH PROPOFOL;  Surgeon: Lollie Sails, MD;  Location: [facility] ENDOSCOPY;  Service: Endoscopy;  Laterality: N/A;  ? EYE SURGERY  2013  ? HERNIA REPAIR  1986  ? ? ?Family History  ?Problem Relation Age of Onset  ? Cancer Mother   ? Cancer Father   ? Cancer Maternal Uncle   ? Cancer Paternal Uncle   ? Heart attack Paternal Grandfather   ? ? ?Social History  ? ?Socioeconomic History  ? Marital status: Married  ?  Spouse name: Not on file  ? Number of children: Not on file  ? Years of education: Not on file  ? Highest education level: Not on file  ?Occupational History  ? Not on file  ?Tobacco Use  ? Smoking status: Former  ?  Years: 50.00  ?  Types: Cigarettes  ?  Quit date: 07/14/1967  ?  Years since quitting: 54.3  ? Smokeless tobacco: Never  ?Vaping Use  ? Vaping Use: Never used  ?Substance and Sexual Activity  ? Alcohol use: Yes  ? Drug use: No  ? Sexual activity: Not on file  ?Other Topics Concern  ? Not on file  ?Social History Narrative  ? Not on file  ? ?Social Determinants of Health  ? ?Financial Resource Strain: Not on file  ?Food Insecurity: Not on file  ?Transportation Needs: Not on file  ?Physical Activity: Not on file  ?Stress: Not on file  ?Social Connections: Not on file  ?Intimate Partner Violence: Not on file  ? ? ? ?Current Outpatient Medications:  ?  aspirin EC 81 MG tablet, Take 81 mg by mouth daily. Swallow whole., Disp: ,  Rfl:  ?  clobetasol cream (TEMOVATE) 0.93 %, Apply 1 application. topically as directed. Qd to bid aa itchy rash/eczema on body prn flares, avoid face, groin, axilla, Disp: 60 g, Rfl: 0 ?  losartan (COZAAR) 100 MG tablet, TAKE 1 TABLET BY MOUTH EVERY DAY, Disp: 90 tablet, Rfl: 3 ?  mupirocin ointment (BACTROBAN) 2 %, Apply 1 application topically daily., Disp: 22 g, Rfl: 0  ? ?No Known Allergies ? ?ROS ?Review of Systems  ?Constitutional: Negative.   ?HENT: Negative.    ?Eyes: Negative.   ?Respiratory: Negative.    ?Cardiovascular: Negative.   ?Gastrointestinal: Negative.   ?Endocrine: Negative.   ?Genitourinary: Negative.   ?Musculoskeletal: Negative.   ?Skin: Negative.   ?Allergic/Immunologic: Negative.   ?Neurological: Negative.   ?Hematological: Negative.   ?Psychiatric/Behavioral: Negative.    ?All other systems reviewed and are negative. ? ?  ?Objective:  ?  ?Physical Exam ?Vitals reviewed.  ?Constitutional:   ?   Appearance: Normal appearance.  ?HENT:  ?   Mouth/Throat:  ?   Mouth: Mucous membranes are moist.  ?Eyes:  ?   Pupils: Pupils are equal, round, and reactive to light.  ?Neck:  ?  Vascular: No carotid bruit.  ?Cardiovascular:  ?   Rate and Rhythm: Normal rate and regular rhythm.  ?   Pulses: Normal pulses.  ?   Heart sounds: Normal heart sounds.  ?Pulmonary:  ?   Effort: Pulmonary effort is normal.  ?   Breath sounds: Normal breath sounds.  ?Abdominal:  ?   General: Bowel sounds are normal.  ?   Palpations: Abdomen is soft. There is no hepatomegaly, splenomegaly or mass.  ?   Tenderness: There is no abdominal tenderness.  ?   Hernia: No hernia is present.  ?Musculoskeletal:  ?   Cervical back: Neck supple.  ?   Right lower leg: No edema.  ?   Left lower leg: No edema.  ?Skin: ?   Findings: No rash.  ?Neurological:  ?   Mental Status: He is alert and oriented to person, place, and time.  ?   Motor: No weakness.  ?Psychiatric:     ?   Mood and Affect: Mood normal.     ?   Behavior: Behavior normal.   ? ? ?BP 140/74   Pulse 67   Ht 6' (1.829 m)   Wt 248 lb 6.4 oz (112.7 kg)   BMI 33.69 kg/m?  ?Wt Readings from Last 3 Encounters:  ?11/04/21 248 lb 6.4 oz (112.7 kg)  ?08/06/21 247 lb 3.2 oz (112.1 kg)  ?05/06/21 246 lb (111.6 kg)  ? ? ? ?Health Maintenance Due  ?Topic Date Due  ? COVID-19 Vaccine (3 - Moderna risk series) 07/15/2020  ? ? ?There are no preventive care reminders to display for this patient. ? ?Lab Results  ?Component Value Date  ? TSH 2.14 04/29/2021  ? ?Lab Results  ?Component Value Date  ? WBC 4.8 04/29/2021  ? HGB 13.5 04/29/2021  ? HCT 39.2 04/29/2021  ? MCV 95.1 04/29/2021  ? PLT 287 04/29/2021  ? ?Lab Results  ?Component Value Date  ? NA 141 04/29/2021  ? K 4.3 04/29/2021  ? CO2 26 04/29/2021  ? GLUCOSE 98 04/29/2021  ? BUN 17 04/29/2021  ? CREATININE 0.95 04/29/2021  ? BILITOT 0.4 04/29/2021  ? ALKPHOS 55 01/25/2021  ? AST 16 04/29/2021  ? ALT 11 04/29/2021  ? PROT 6.3 04/29/2021  ? ALBUMIN 3.6 01/25/2021  ? CALCIUM 8.6 04/29/2021  ? ANIONGAP 8 01/25/2021  ? EGFR 83 04/29/2021  ? ?Lab Results  ?Component Value Date  ? CHOL 135 04/29/2021  ? ?Lab Results  ?Component Value Date  ? HDL 39 (L) 04/29/2021  ? ?Lab Results  ?Component Value Date  ? [place] 82 04/29/2021  ? ?Lab Results  ?Component Value Date  ? TRIG 61 04/29/2021  ? ?Lab Results  ?Component Value Date  ? CHOLHDL 3.5 04/29/2021  ? ?No results found for: HGBA1C ? ?  ?Assessment & Plan:  ? ?Problem List Items Addressed This Visit   ? ?  ? Cardiovascular and Mediastinum  ? Varicose veins of bilateral lower extremities with other complications - Primary  ?  There is no swelling dermatitis I asked him to use support hose ? ?  ?  ? Essential hypertension  ?   Patient denies any chest pain or shortness of breath there is no history of palpitation or paroxysmal nocturnal dyspnea ?  patient was advised to follow low-salt low-cholesterol diet ? ?  ideally I want to keep systolic blood pressure below 130 mmHg, patient was asked to check  blood pressure one times a week and give me  a report on that.  Patient will be follow-up in 3 months  or earlier as needed, patient will call me back for any change in the cardiovascular symptoms ?Patient was advised to buy a book from local bookstore concerning blood pressure and read several chapters  every day.  This will be supplemented by some of the material we will give him from the office.  Patient should also utilize other resources like YouTube and Internet to learn more about the blood pressure and the diet. ? ?  ?  ?  ? Respiratory  ? Seasonal allergic rhinitis due to pollen  ?  Take Claritin 10 mg p.o. as needed ? ?  ?  ?  ? Musculoskeletal and Integument  ? Dupuytren's disease of palm  ?  Stable at the present time ? ?  ?  ?  ? Other  ? Hyperlipidemia  ?  Hypercholesterolemia ? ?I advised the patient to follow Mediterranean diet ?This diet is rich in fruits vegetables and whole grain, and ?This diet is also rich in fish and lean meat ?Patient should also eat a handful of almonds or walnuts daily ?Recent heart study indicated that average follow-up on this kind of diet reduces the cardiovascular mortality by 50 to 70%== ? ?  ?  ? ? ?No orders of the defined types were placed in this encounter. ? ? ?Follow-up: No follow-ups on file.  ? ? ?Cletis Athens, MD ?

## 2021-11-13 DIAGNOSIS — M25561 Pain in right knee: Secondary | ICD-10-CM | POA: Diagnosis not present

## 2021-11-13 DIAGNOSIS — M25661 Stiffness of right knee, not elsewhere classified: Secondary | ICD-10-CM | POA: Diagnosis not present

## 2021-11-19 DIAGNOSIS — M25661 Stiffness of right knee, not elsewhere classified: Secondary | ICD-10-CM | POA: Diagnosis not present

## 2021-11-19 DIAGNOSIS — M25561 Pain in right knee: Secondary | ICD-10-CM | POA: Diagnosis not present

## 2021-11-20 DIAGNOSIS — M5136 Other intervertebral disc degeneration, lumbar region: Secondary | ICD-10-CM | POA: Diagnosis not present

## 2021-11-20 DIAGNOSIS — M955 Acquired deformity of pelvis: Secondary | ICD-10-CM | POA: Diagnosis not present

## 2021-11-20 DIAGNOSIS — M9903 Segmental and somatic dysfunction of lumbar region: Secondary | ICD-10-CM | POA: Diagnosis not present

## 2021-11-20 DIAGNOSIS — M9905 Segmental and somatic dysfunction of pelvic region: Secondary | ICD-10-CM | POA: Diagnosis not present

## 2021-11-21 DIAGNOSIS — M25561 Pain in right knee: Secondary | ICD-10-CM | POA: Diagnosis not present

## 2021-11-24 DIAGNOSIS — M25661 Stiffness of right knee, not elsewhere classified: Secondary | ICD-10-CM | POA: Diagnosis not present

## 2021-11-24 DIAGNOSIS — M25561 Pain in right knee: Secondary | ICD-10-CM | POA: Diagnosis not present

## 2021-12-02 DIAGNOSIS — M25661 Stiffness of right knee, not elsewhere classified: Secondary | ICD-10-CM | POA: Diagnosis not present

## 2021-12-02 DIAGNOSIS — M25561 Pain in right knee: Secondary | ICD-10-CM | POA: Diagnosis not present

## 2021-12-11 DIAGNOSIS — M25561 Pain in right knee: Secondary | ICD-10-CM | POA: Diagnosis not present

## 2021-12-11 DIAGNOSIS — M25661 Stiffness of right knee, not elsewhere classified: Secondary | ICD-10-CM | POA: Diagnosis not present

## 2021-12-15 DIAGNOSIS — M25661 Stiffness of right knee, not elsewhere classified: Secondary | ICD-10-CM | POA: Diagnosis not present

## 2021-12-15 DIAGNOSIS — M25561 Pain in right knee: Secondary | ICD-10-CM | POA: Diagnosis not present

## 2021-12-18 DIAGNOSIS — M9903 Segmental and somatic dysfunction of lumbar region: Secondary | ICD-10-CM | POA: Diagnosis not present

## 2021-12-18 DIAGNOSIS — M955 Acquired deformity of pelvis: Secondary | ICD-10-CM | POA: Diagnosis not present

## 2021-12-18 DIAGNOSIS — M5136 Other intervertebral disc degeneration, lumbar region: Secondary | ICD-10-CM | POA: Diagnosis not present

## 2021-12-18 DIAGNOSIS — M9905 Segmental and somatic dysfunction of pelvic region: Secondary | ICD-10-CM | POA: Diagnosis not present

## 2021-12-25 ENCOUNTER — Ambulatory Visit (INDEPENDENT_AMBULATORY_CARE_PROVIDER_SITE_OTHER): Payer: Medicare Other | Admitting: *Deleted

## 2021-12-25 ENCOUNTER — Ambulatory Visit (INDEPENDENT_AMBULATORY_CARE_PROVIDER_SITE_OTHER): Payer: Medicare Other | Admitting: Podiatry

## 2021-12-25 DIAGNOSIS — I872 Venous insufficiency (chronic) (peripheral): Secondary | ICD-10-CM

## 2021-12-25 DIAGNOSIS — M79675 Pain in left toe(s): Secondary | ICD-10-CM

## 2021-12-25 DIAGNOSIS — M2040 Other hammer toe(s) (acquired), unspecified foot: Secondary | ICD-10-CM | POA: Insufficient documentation

## 2021-12-25 DIAGNOSIS — M79674 Pain in right toe(s): Secondary | ICD-10-CM | POA: Diagnosis not present

## 2021-12-25 DIAGNOSIS — Z0000 Encounter for general adult medical examination without abnormal findings: Secondary | ICD-10-CM | POA: Diagnosis not present

## 2021-12-25 DIAGNOSIS — M2042 Other hammer toe(s) (acquired), left foot: Secondary | ICD-10-CM

## 2021-12-25 DIAGNOSIS — B351 Tinea unguium: Secondary | ICD-10-CM | POA: Diagnosis not present

## 2021-12-25 NOTE — Progress Notes (Addendum)
Subjective:   Paul Buckley. is a 76 y.o. male who presents for Medicare Annual/Subsequent preventive examination.  I discussed the limitations of evaluation and management by telemedicine and the availability of in person appointments. Patient expressed understanding and agreed to proceed.   Visit performed using audio  Patient:home Provider:home   Review of Systems    Defer to provider Cardiac Risk Factors include: advanced age (>40mn, >>57women);male gender     Objective:    There were no vitals filed for this visit. There is no height or weight on file to calculate BMI.     12/25/2021   12:19 PM 01/25/2021    2:19 AM 04/25/2018   10:13 AM 11/26/2017   10:16 PM  Advanced Directives  Does Patient Have a Medical Advance Directive? No Yes No No  Type of Advance Directive  Living will    Would patient like information on creating a medical advance directive? No - Patient declined  No - Patient declined No - Patient declined    Current Medications (verified) Outpatient Encounter Medications as of 12/25/2021  Medication Sig   aspirin EC 81 MG tablet Take 81 mg by mouth daily. Swallow whole.   clobetasol cream (TEMOVATE) 01.06% Apply 1 application. topically as directed. Qd to bid aa itchy rash/eczema on body prn flares, avoid face, groin, axilla   losartan (COZAAR) 100 MG tablet TAKE 1 TABLET BY MOUTH EVERY DAY   mupirocin ointment (BACTROBAN) 2 % Apply 1 application topically daily.   No facility-administered encounter medications on file as of 12/25/2021.    Allergies (verified) Patient has no known allergies.   History: Past Medical History:  Diagnosis Date   Colon, diverticulosis    GERD (gastroesophageal reflux disease)    History of colonic polyps    Internal hemorrhoids    Pneumonia    Past Surgical History:  Procedure Laterality Date   APPENDECTOMY  1963   COLONOSCOPY WITH PROPOFOL N/A 04/25/2018   Procedure: COLONOSCOPY WITH PROPOFOL;  Surgeon:  SLollie Sails MD;  [facility]  Service: Endoscopy;  Laterality: N/A;   EYE SURGERY  2013   HERNIA REPAIR  1986   Family History  Problem Relation Age of Onset   Cancer Mother    Cancer Father    Cancer Maternal Uncle    Cancer Paternal Uncle    Heart attack Paternal Grandfather    Social History   Socioeconomic History   Marital status: Widowed    Spouse name: Not on file   Number of children: Not on file   Years of education: Not on file   Highest education level: Master's degree (e.g., MA, MS, MEng, MEd, MSW, MBA)  Occupational History   Not on file  Tobacco Use   Smoking status: Former    Years: 50.00    Types: Cigarettes    Quit date: 07/14/1967    Years since quitting: 54.4   Smokeless tobacco: Never  Vaping Use   Vaping Use: Never used  Substance and Sexual Activity   Alcohol use: Yes    Comment: occasional   Drug use: No   Sexual activity: Not Currently  Other Topics Concern   Not on file  Social History Narrative   Not on file   Social Determinants of Health   Financial Resource Strain: Low Risk  (12/25/2021)   Overall Financial Resource Strain (CARDIA)    Difficulty of Paying Living Expenses: Not hard at all  Food Insecurity: No Food Insecurity (  12/25/2021)   Hunger Vital Sign    Worried About Running Out of Food in the Last Year: Never true    [place] in the Last Year: Never true  Transportation Needs: No Transportation Needs (12/25/2021)   PRAPARE - Hydrologist (Medical): No    Lack of Transportation (Non-Medical): No  Physical Activity: Sufficiently Active (12/25/2021)   Exercise Vital Sign    Days of Exercise per Week: 5 days    Minutes of Exercise per Session: 40 min  Stress: No Stress Concern Present (12/25/2021)   [place]    Feeling of Stress : Not at all  Social Connections: Moderately Integrated (12/25/2021)   Social  Connection and Isolation Panel [NHANES]    Frequency of Communication with Friends and Family: More than three times a week    Frequency of Social Gatherings with Friends and Family: More than three times a week    Attends Religious Services: More than 4 times per year    Active Member of [employer] or Organizations: No    Attends Music therapist: More than 4 times per year    Marital Status: Widowed    Tobacco Counseling Counseling given: Not Answered   Clinical Intake:  Pre-visit preparation completed: Yes  Pain : No/denies pain     Diabetes: No  How often do you need to have someone help you when you read instructions, pamphlets, or other written materials from your doctor or pharmacy?: 1 - Never What is the last grade level you completed in school?: masters degree  Diabetic?no  Interpreter Needed?: No  Information entered by :: Dominica Severin   Activities of Daily Living    12/25/2021   12:19 PM  In your present state of health, do you have any difficulty performing the following activities:  Hearing? 0  Vision? 0  Difficulty concentrating or making decisions? 0  Walking or climbing stairs? 0  Dressing or bathing? 0  Doing errands, shopping? 0  Preparing Food and eating ? N  Using the Toilet? N  In the past six months, have you accidently leaked urine? N  Do you have problems with loss of bowel control? N  Managing your Medications? N  Managing your Finances? N  Housekeeping or managing your Housekeeping? N    Patient Care Team: Theresia Lo, NP as PCP - General (Nurse Practitioner) Christene Lye, MD (General Surgery) Cletis Athens, MD (Internal Medicine)  Indicate any recent Medical Services you may have received from other than [facility] providers in the past year (date may be approximate).     Assessment:   This is a routine wellness examination for [name].  Hearing/Vision screen No results found.  Dietary issues and  exercise activities discussed: Current Exercise Habits: Home exercise routine, Type of exercise: walking, Time (Minutes): 40, Frequency (Times/Week): 4, Weekly Exercise (Minutes/Week): 160, Intensity: Mild, Exercise limited by: None identified   Goals Addressed   None    Depression Screen    12/25/2021   12:17 PM 05/06/2021    8:48 AM 01/30/2021    9:29 AM 05/22/2020    9:32 AM  PHQ 2/9 Scores  PHQ - 2 Score 0 0 0 0    Fall Risk    12/25/2021   12:04 PM 11/04/2021   10:14 AM 05/06/2021    8:47 AM 01/30/2021    9:29 AM 05/22/2020    9:32 AM  Fall Risk  Falls in the past year? 0 0  0   Number falls in past yr: 0 0 0 0 0  Injury with Fall? 0 0 0 0 0  Risk for fall due to : No Fall Risks No Fall Risks No Fall Risks No Fall Risks   Follow up Falls evaluation completed Falls evaluation completed Falls evaluation completed Falls evaluation completed     [place]:  Any stairs in or around the home? No  If so, are there any without handrails? No  Home free of loose throw rugs in walkways, pet beds, electrical cords, etc? Yes  Adequate lighting in your home to reduce risk of falls? Yes   ASSISTIVE DEVICES UTILIZED TO PREVENT FALLS:  Life alert? No  Use of a cane, walker or w/c? No  Grab bars in the bathroom?yes Shower chair or bench in shower? No  Elevated toilet seat or a handicapped toilet? No   TIMED UP AND GO:  Was the test performed? No .  Length of time to ambulate -na    Cognitive Function:    12/25/2021   12:20 PM  MMSE - Mini Mental State Exam  Not completed: Unable to complete        12/25/2021   12:20 PM  6CIT Screen  What Year? 0 points  What month? 0 points  What time? 0 points  Count back from 20 0 points  Months in reverse 0 points  Repeat phrase 0 points  Total Score 0 points    Immunizations Immunization History  Administered Date(s) Administered   Influenza, High Dose Seasonal PF 04/14/2017    Influenza,inj,Quad PF,6+ Mos 04/26/2018   Influenza-Unspecified 04/24/2017, 04/05/2020, 04/21/2021   Moderna SARS-COV2 Booster Vaccination 06/17/2020   Moderna Sars-Covid-2 Vaccination 09/04/2019, 10/03/2019   PNEUMOCOCCAL CONJUGATE-20 04/29/2021   Tdap 05/22/2020   Zoster Recombinat (Shingrix) 01/16/2021, 04/21/2021    TDAP status: Up to date  Flu Vaccine status: Up to date  Pneumococcal vaccine status: Up to date  Covid-19 vaccine status: Information provided on how to obtain vaccines.   Qualifies for Shingles Vaccine? No   Zostavax completed Yes   Shingrix Completed?: Yes  Screening Tests Health Maintenance  Topic Date Due   COVID-19 Vaccine (3 - Moderna risk series) 07/15/2020   INFLUENZA VACCINE  02/10/2022   TETANUS/TDAP  05/22/2030   Pneumonia Vaccine 63+ Years old  Completed   Hepatitis C Screening  Completed   Zoster Vaccines- Shingrix  Completed   HPV VACCINES  Aged Out   COLONOSCOPY (Pts 45-85yr Insurance coverage will need to be confirmed)  Discontinued    Health Maintenance  Health Maintenance Due  Topic Date Due   COVID-19 Vaccine (3 - Moderna risk series) 07/15/2020    Colorectal cancer screening: Type of screening: Colonoscopy. Completed 2019. Repeat every 5 years  Lung Cancer Screening: (Low Dose CT Chest recommended if Age 77-80years, 30 pack-year currently smoking OR have quit w/in 15years.) does qualify.   Lung Cancer Screening Referral: patient had screening in 2016 and does not wish to have one at this time   Additional Screening:  Hepatitis C Screening: does not qualify; Completed 04/29/2021  Vision Screening: Recommended annual ophthalmology exams for early detection of glaucoma and other disorders of the eye. Is the patient up to date with their annual eye exam?  Yes  Who is the provider or what is the name of the office in which the patient attends annual eye exams? [facility] If  pt is not established with a provider, would they like  to be referred to a provider to establish care? No .   Dental Screening: Recommended annual dental exams for proper oral hygiene  Community Resource Referral / Chronic Care Management: CRR required this visit?  No   CCM required this visit?  No      Plan:     I have personally reviewed and noted the following in the patient's chart:   Medical and social history Use of alcohol, tobacco or illicit drugs  Current medications and supplements including opioid prescriptions. Patient is not currently taking opioid prescriptions. Functional ability and status Nutritional status Physical activity Advanced directives List of other physicians Hospitalizations, surgeries, and ER visits in previous 12 months Vitals Screenings to include cognitive, depression, and falls Referrals and appointments  In addition, I have reviewed and discussed with patient certain preventive protocols, quality metrics, and best practice recommendations. A written personalized care plan for preventive services as well as general preventive health recommendations were provided to patient.     Lacretia Nicks, [place]   12/25/2021   Nurse Notes:  Mr. Hedman , Thank you for taking time to come for your Medicare Wellness Visit. I appreciate your ongoing commitment to your health goals. Please review the following plan we discussed and let me know if I can assist you in the future.   These are the goals we discussed:  Goals   None     This is a list of the screening recommended for you and due dates:  Health Maintenance  Topic Date Due   COVID-19 Vaccine (3 - Moderna risk series) 07/15/2020   Flu Shot  02/10/2022   Tetanus Vaccine  05/22/2030   Pneumonia Vaccine  Completed   Hepatitis C Screening: USPSTF Recommendation to screen - Ages 18-79 yo.  Completed   Zoster (Shingles) Vaccine  Completed   HPV Vaccine  Aged Out   Colon Cancer Screening  Discontinued    I have reviewed and agreed to the above  documentation.   Theresia Lo, FNP

## 2021-12-25 NOTE — Progress Notes (Signed)
This patient returns to my office for at risk foot care.  This patient requires this care by a professional since this patient will be at risk due to having venous insufficiency.  This patient is unable to cut nails easily  himself since the patient cannot reach his nails.These nails are painful walking and wearing shoes.  This patient presents for at risk foot care today.  General Appearance  Alert, conversant and in no acute stress.  Vascular  Dorsalis pedis and posterior tibial  pulses are palpable  bilaterally.  Capillary return is within normal limits  bilaterally. Temperature is within normal limits  bilaterally.  Neurologic  Senn-Weinstein monofilament wire test within normal limits  bilaterally. Muscle power within normal limits bilaterally.  Nails Thick disfigured discolored nails with subungual debris  from hallux to fifth toes bilaterally. No evidence of bacterial infection or drainage bilaterally.  Orthopedic  No limitations of motion  feet .  No crepitus or effusions noted.  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    Skin  normotropic skin with no porokeratosis noted bilaterally.  No signs of infections or ulcers noted.     Onychomycosis  Pain in right toes  Pain in left toes  Consent was obtained for treatment procedures.   Mechanical debridement of nails 1-5  bilaterally performed with a nail nipper.  Filed with dremel without incident. Discussed his hammer toe second left foot.  Padding dispensed.   Return office visit   3 months.                  Told patient to return for periodic foot care and evaluation due to potential at risk complications.     Zeinab Rodwell DPM  

## 2022-01-22 DIAGNOSIS — M5136 Other intervertebral disc degeneration, lumbar region: Secondary | ICD-10-CM | POA: Diagnosis not present

## 2022-01-22 DIAGNOSIS — M955 Acquired deformity of pelvis: Secondary | ICD-10-CM | POA: Diagnosis not present

## 2022-01-22 DIAGNOSIS — M9905 Segmental and somatic dysfunction of pelvic region: Secondary | ICD-10-CM | POA: Diagnosis not present

## 2022-01-22 DIAGNOSIS — M9903 Segmental and somatic dysfunction of lumbar region: Secondary | ICD-10-CM | POA: Diagnosis not present

## 2022-01-23 ENCOUNTER — Ambulatory Visit (INDEPENDENT_AMBULATORY_CARE_PROVIDER_SITE_OTHER): Payer: Medicare Other | Admitting: Nurse Practitioner

## 2022-01-23 ENCOUNTER — Encounter: Payer: Self-pay | Admitting: Nurse Practitioner

## 2022-01-23 VITALS — BP 164/88 | HR 78 | Ht 72.0 in | Wt 248.4 lb

## 2022-01-23 DIAGNOSIS — J028 Acute pharyngitis due to other specified organisms: Secondary | ICD-10-CM

## 2022-01-23 DIAGNOSIS — J029 Acute pharyngitis, unspecified: Secondary | ICD-10-CM | POA: Insufficient documentation

## 2022-01-23 MED ORDER — AZITHROMYCIN 250 MG PO TABS: ORAL_TABLET | ORAL | 0 refills | Status: AC

## 2022-01-23 NOTE — Progress Notes (Signed)
Established Patient Office Visit  Subjective:  Patient ID: Paul Buckley., male    DOB: 12-21-1945  Age: 76 y.o. MRN: [id number]  CC:  Chief Complaint  Patient presents with   Sore Throat    Patient complains of sore throat, has had some cough, and feeling a little dizzy, symptoms started on  Tuesday     HPI Paul Buckley. presents for sore throat. Sore Throat  This is a new problem. The current episode started in the past 7 days. The problem has been gradually worsening. There has been no fever. The pain is at a severity of 6/10. The pain is moderate. Associated symptoms include coughing. Pertinent negatives include no congestion or shortness of breath. He has tried NSAIDs for the symptoms. The treatment provided moderate relief.   Patient reports he had 2 or 3 episodes of dizziness while getting up from the bed. Patient denies being dizzy at present.     Past Medical History:  Diagnosis Date   Colon, diverticulosis    GERD (gastroesophageal reflux disease)    History of colonic polyps    Internal hemorrhoids    Pneumonia     Past Surgical History:  Procedure Laterality Date   APPENDECTOMY  1963   COLONOSCOPY WITH PROPOFOL N/A 04/25/2018   Procedure: COLONOSCOPY WITH PROPOFOL;  Surgeon: Lollie Sails, MD;  [facility];  Service: Endoscopy;  Laterality: N/A;   EYE SURGERY  2013   HERNIA REPAIR  1986    Family History  Problem Relation Age of Onset   Cancer Mother    Cancer Father    Cancer Maternal Uncle    Cancer Paternal Uncle    Heart attack Paternal Grandfather     Social History   Socioeconomic History   Marital status: Widowed    Spouse name: Not on file   Number of children: Not on file   Years of education: Not on file   Highest education level: Master's degree (e.g., MA, MS, MEng, MEd, MSW, MBA)  Occupational History   Not on file  Tobacco Use   Smoking status: Former    Years: 50.00    Types: Cigarettes    Quit date:  07/14/1967    Years since quitting: 54.5   Smokeless tobacco: Never  Vaping Use   Vaping Use: Never used  Substance and Sexual Activity   Alcohol use: Yes    Comment: occasional   Drug use: No   Sexual activity: Not Currently  Other Topics Concern   Not on file  Social History Narrative   Not on file   Social Determinants of Health   Financial Resource Strain: Low Risk  (12/25/2021)   Overall Financial Resource Strain (CARDIA)    Difficulty of Paying Living Expenses: Not hard at all  Food Insecurity: No Food Insecurity (12/25/2021)   Hunger Vital Sign    Worried About Running Out of Food in the Last Year: Never true    [place] in the Last Year: Never true  Transportation Needs: No Transportation Needs (12/25/2021)   PRAPARE - Hydrologist (Medical): No    Lack of Transportation (Non-Medical): No  Physical Activity: Sufficiently Active (12/25/2021)   Exercise Vital Sign    Days of Exercise per Week: 5 days    Minutes of Exercise per Session: 40 min  Stress: No Stress Concern Present (12/25/2021)   [place]  Feeling of Stress : Not at all  Social Connections: Moderately Integrated (12/25/2021)   Social Connection and Isolation Panel [NHANES]    Frequency of Communication with Friends and Family: More than three times a week    Frequency of Social Gatherings with Friends and Family: More than three times a week    Attends Religious Services: More than 4 times per year    Active Member of [employer] or Organizations: No    Attends Music therapist: More than 4 times per year    Marital Status: Widowed  Intimate Partner Violence: Not At Risk (12/25/2021)   Humiliation, Afraid, Rape, and Kick questionnaire    Fear of Current or Ex-Partner: No    Emotionally Abused: No    Physically Abused: No    Sexually Abused: No     Current Outpatient Medications:    azithromycin  (ZITHROMAX) 250 MG tablet, Take 2 tablets on day 1, then 1 tablet daily on days 2 through 5, Disp: 6 tablet, Rfl: 0   aspirin EC 81 MG tablet, Take 81 mg by mouth daily. Swallow whole., Disp: , Rfl:    clobetasol cream (TEMOVATE) 1.51 %, Apply 1 application. topically as directed. Qd to bid aa itchy rash/eczema on body prn flares, avoid face, groin, axilla, Disp: 60 g, Rfl: 0   losartan (COZAAR) 100 MG tablet, TAKE 1 TABLET BY MOUTH EVERY DAY, Disp: 90 tablet, Rfl: 3   mupirocin ointment (BACTROBAN) 2 %, Apply 1 application topically daily., Disp: 22 g, Rfl: 0   No Known Allergies  ROS Review of Systems  Constitutional: Negative.  Negative for activity change.  HENT:  Positive for sore throat. Negative for congestion.   Eyes: Negative.   Respiratory:  Positive for cough. Negative for shortness of breath.   Cardiovascular:  Negative for chest pain and palpitations.  Gastrointestinal: Negative.   Endocrine: Negative.   Genitourinary: Negative.   Musculoskeletal: Negative.   Skin: Negative.   Allergic/Immunologic: Negative.   Neurological: Negative.   Hematological: Negative.   Psychiatric/Behavioral: Negative.    All other systems reviewed and are negative.     Objective:    Physical Exam Vitals reviewed.  Constitutional:      Appearance: Normal appearance.  HENT:     Mouth/Throat:     Mouth: Mucous membranes are moist.     Pharynx: Posterior oropharyngeal erythema present.  Eyes:     Pupils: Pupils are equal, round, and reactive to light.  Neck:     Vascular: No carotid bruit.  Cardiovascular:     Rate and Rhythm: Normal rate and regular rhythm.     Pulses: Normal pulses.     Heart sounds: Normal heart sounds.  Pulmonary:     Effort: Pulmonary effort is normal.     Breath sounds: Normal breath sounds.  Abdominal:     General: Bowel sounds are normal.     Palpations: Abdomen is soft. There is no hepatomegaly, splenomegaly or mass.     Tenderness: There is no  abdominal tenderness.     Hernia: No hernia is present.  Musculoskeletal:     Cervical back: Neck supple.     Right lower leg: No edema.     Left lower leg: No edema.  Skin:    Findings: No rash.  Neurological:     Mental Status: He is alert and oriented to person, place, and time.     Motor: No weakness.  Psychiatric:        Mood  and Affect: Mood normal.        Behavior: Behavior normal.     BP (!) 164/88   Pulse 78   Ht 6' (1.829 m)   Wt 248 lb 6.4 oz (112.7 kg)   BMI 33.69 kg/m  Wt Readings from Last 3 Encounters:  01/23/22 248 lb 6.4 oz (112.7 kg)  11/04/21 248 lb 6.4 oz (112.7 kg)  08/06/21 247 lb 3.2 oz (112.1 kg)     Health Maintenance Due  Topic Date Due   COVID-19 Vaccine (3 - Moderna risk series) 07/15/2020    There are no preventive care reminders to display for this patient.  Lab Results  Component Value Date   TSH 2.14 04/29/2021   Lab Results  Component Value Date   WBC 4.8 04/29/2021   HGB 13.5 04/29/2021   HCT 39.2 04/29/2021   MCV 95.1 04/29/2021   PLT 287 04/29/2021   Lab Results  Component Value Date   NA 141 04/29/2021   K 4.3 04/29/2021   CO2 26 04/29/2021   GLUCOSE 98 04/29/2021   BUN 17 04/29/2021   CREATININE 0.95 04/29/2021   BILITOT 0.4 04/29/2021   ALKPHOS 55 01/25/2021   AST 16 04/29/2021   ALT 11 04/29/2021   PROT 6.3 04/29/2021   ALBUMIN 3.6 01/25/2021   CALCIUM 8.6 04/29/2021   ANIONGAP 8 01/25/2021   EGFR 83 04/29/2021   Lab Results  Component Value Date   CHOL 135 04/29/2021   Lab Results  Component Value Date   HDL 39 (L) 04/29/2021   Lab Results  Component Value Date   LDLCALC 82 04/29/2021   Lab Results  Component Value Date   TRIG 61 04/29/2021   Lab Results  Component Value Date   CHOLHDL 3.5 04/29/2021   No results found for: "HGBA1C"    Assessment & Plan:   Problem List Items Addressed This Visit       Respiratory   Pharyngitis - Primary    Advised patient to do warm water and salt  gargles. Started him on Z-Pak for 5 days over-the-counter Tylenol for pain. Advised him to increase his fluid intake.               Advised patient to change positions slowly while getting up from the bed.  Meds ordered this encounter  Medications   azithromycin (ZITHROMAX) 250 MG tablet    Sig: Take 2 tablets on day 1, then 1 tablet daily on days 2 through 5    Dispense:  6 tablet    Refill:  0    Follow-up: Return if symptoms worsen or fail to improve.    Theresia Lo, NP

## 2022-01-23 NOTE — Assessment & Plan Note (Addendum)
Advised patient to do warm water and salt gargles. Started him on Z-Pak for 5 days over-the-counter Tylenol for pain. Advised him to increase his fluid intake.

## 2022-02-03 ENCOUNTER — Ambulatory Visit: Payer: Medicare Other | Admitting: Internal Medicine

## 2022-02-10 ENCOUNTER — Ambulatory Visit (INDEPENDENT_AMBULATORY_CARE_PROVIDER_SITE_OTHER): Payer: Medicare Other | Admitting: Internal Medicine

## 2022-02-10 ENCOUNTER — Encounter: Payer: Self-pay | Admitting: Internal Medicine

## 2022-02-10 VITALS — BP 139/89 | HR 82 | Ht 72.0 in | Wt 238.9 lb

## 2022-02-10 DIAGNOSIS — M720 Palmar fascial fibromatosis [Dupuytren]: Secondary | ICD-10-CM

## 2022-02-10 DIAGNOSIS — I8001 Phlebitis and thrombophlebitis of superficial vessels of right lower extremity: Secondary | ICD-10-CM

## 2022-02-10 DIAGNOSIS — I872 Venous insufficiency (chronic) (peripheral): Secondary | ICD-10-CM

## 2022-02-10 DIAGNOSIS — R972 Elevated prostate specific antigen [PSA]: Secondary | ICD-10-CM | POA: Diagnosis not present

## 2022-02-10 DIAGNOSIS — J301 Allergic rhinitis due to pollen: Secondary | ICD-10-CM | POA: Diagnosis not present

## 2022-02-10 DIAGNOSIS — I10 Essential (primary) hypertension: Secondary | ICD-10-CM | POA: Diagnosis not present

## 2022-02-10 NOTE — Assessment & Plan Note (Signed)
Take Claritin 10 mg p.o. daily 

## 2022-02-10 NOTE — Assessment & Plan Note (Signed)

## 2022-02-10 NOTE — Assessment & Plan Note (Signed)
Stable at the present time. 

## 2022-02-10 NOTE — Assessment & Plan Note (Signed)
Schedule an annual physical

## 2022-02-10 NOTE — Progress Notes (Signed)
Established Patient Office Visit  Subjective:  Patient ID: Paul Buckley., male    DOB: [birth year]  Age: 76 y.o. MRN: [id number]  CC:  Chief Complaint  Patient presents with   Follow-up    Dizziness The current episode started in the past 7 days. The problem occurs every several days. The problem has been waxing and waning. Associated symptoms include coughing. Pertinent negatives include no abdominal pain, anorexia, arthralgias, change in bowel habit, chest pain, chills, congestion, headaches, myalgias, nausea, sore throat, swollen glands, urinary symptoms or visual change.    Paul Buckley. presents for dissiness  Past Medical History:  Diagnosis Date   Colon, diverticulosis    GERD (gastroesophageal reflux disease)    History of colonic polyps    Internal hemorrhoids    Pneumonia     Past Surgical History:  Procedure Laterality Date   APPENDECTOMY  1963   COLONOSCOPY WITH PROPOFOL N/A 04/25/2018   Procedure: COLONOSCOPY WITH PROPOFOL;  Surgeon: Lollie Sails, MD;  Location: [facility] ENDOSCOPY;  Service: Endoscopy;  Laterality: N/A;   EYE SURGERY  2013   HERNIA REPAIR  1986    Family History  Problem Relation Age of Onset   Cancer Mother    Cancer Father    Cancer Maternal Uncle    Cancer Paternal Uncle    Heart attack Paternal Grandfather     Social History   Socioeconomic History   Marital status: Widowed    Spouse name: Not on file   Number of children: Not on file   Years of education: Not on file   Highest education level: Master's degree (e.g., MA, MS, MEng, MEd, MSW, MBA)  Occupational History   Not on file  Tobacco Use   Smoking status: Former    Years: 50.00    Types: Cigarettes    Quit date: 07/14/1967    Years since quitting: 77.6   Smokeless tobacco: Never  Vaping Use   Vaping Use: Never used  Substance and Sexual Activity   Alcohol use: Yes    Comment: occasional   Drug use: No   Sexual activity: Not Currently  Other Topics  Concern   Not on file  Social History Narrative   Not on file   Social Determinants of Health   Financial Resource Strain: Low Risk  (12/25/2021)   Overall Financial Resource Strain (CARDIA)    Difficulty of Paying Living Expenses: Not hard at all  Food Insecurity: No Food Insecurity (12/25/2021)   Hunger Vital Sign    Worried About Running Out of Food in the Last Year: Never true    [place] in the Last Year: Never true  Transportation Needs: No Transportation Needs (12/25/2021)   PRAPARE - Hydrologist (Medical): No    Lack of Transportation (Non-Medical): No  Physical Activity: Sufficiently Active (12/25/2021)   Exercise Vital Sign    Days of Exercise per Week: 5 days    Minutes of Exercise per Session: 40 min  Stress: No Stress Concern Present (12/25/2021)   [place]    Feeling of Stress : Not at all  Social Connections: Moderately Integrated (12/25/2021)   Social Connection and Isolation Panel [NHANES]    Frequency of Communication with Friends and Family: More than three times a week    Frequency of Social Gatherings with Friends and Family: More than three times a week    Attends Religious  Services: More than 4 times per year    Active Member of Clubs or Organizations: No    Attends Archivist Meetings: More than 4 times per year    Marital Status: Widowed  Intimate Partner Violence: Not At Risk (12/25/2021)   Humiliation, Afraid, Rape, and Kick questionnaire    Fear of Current or Ex-Partner: No    Emotionally Abused: No    Physically Abused: No    Sexually Abused: No     Current Outpatient Medications:    aspirin EC 81 MG tablet, Take 81 mg by mouth daily. Swallow whole., Disp: , Rfl:    clobetasol cream (TEMOVATE) 3.87 %, Apply 1 application. topically as directed. Qd to bid aa itchy rash/eczema on body prn flares, avoid face, groin, axilla, Disp: 60 g, Rfl:  0   losartan (COZAAR) 100 MG tablet, TAKE 1 TABLET BY MOUTH EVERY DAY, Disp: 90 tablet, Rfl: 3   mupirocin ointment (BACTROBAN) 2 %, Apply 1 application topically daily., Disp: 22 g, Rfl: 0   No Known Allergies  ROS Review of Systems  Constitutional: Negative.  Negative for chills.  HENT: Negative.  Negative for congestion and sore throat.   Eyes: Negative.   Respiratory:  Positive for cough.   Cardiovascular: Negative.  Negative for chest pain.  Gastrointestinal: Negative.  Negative for abdominal pain, anorexia, change in bowel habit and nausea.  Endocrine: Negative.   Genitourinary: Negative.   Musculoskeletal: Negative.  Negative for arthralgias and myalgias.  Skin: Negative.   Allergic/Immunologic: Negative.   Neurological:  Positive for dizziness. Negative for headaches.  Hematological: Negative.   Psychiatric/Behavioral: Negative.    All other systems reviewed and are negative.     Objective:    Physical Exam Vitals reviewed.  Constitutional:      Appearance: Normal appearance.  HENT:     Mouth/Throat:     Mouth: Mucous membranes are moist.  Eyes:     Pupils: Pupils are equal, round, and reactive to light.  Neck:     Vascular: No carotid bruit.  Cardiovascular:     Rate and Rhythm: Normal rate and regular rhythm.     Pulses: Normal pulses.     Heart sounds: Normal heart sounds.  Pulmonary:     Effort: Pulmonary effort is normal.     Breath sounds: Normal breath sounds.  Abdominal:     General: Bowel sounds are normal.     Palpations: Abdomen is soft. There is no hepatomegaly, splenomegaly or mass.     Tenderness: There is no abdominal tenderness.     Hernia: No hernia is present.  Musculoskeletal:     Cervical back: Neck supple.     Right lower leg: No edema.     Left lower leg: No edema.  Skin:    Findings: No rash.  Neurological:     Mental Status: He is alert and oriented to person, place, and time.     Motor: No weakness.  Psychiatric:         Mood and Affect: Mood normal.        Behavior: Behavior normal.     BP 139/89   Pulse 82   Ht 6' (1.829 m)   Wt 238 lb 14.4 oz (108.4 kg)   BMI 32.40 kg/m  Wt Readings from Last 3 Encounters:  02/10/22 238 lb 14.4 oz (108.4 kg)  01/23/22 248 lb 6.4 oz (112.7 kg)  11/04/21 248 lb 6.4 oz (112.7 kg)     Health Maintenance  Due  Topic Date Due   COVID-19 Vaccine (3 - Moderna risk series) 07/15/2020   INFLUENZA VACCINE  02/10/2022    There are no preventive care reminders to display for this patient.  Lab Results  Component Value Date   TSH 2.14 04/29/2021   Lab Results  Component Value Date   WBC 4.8 04/29/2021   HGB 13.5 04/29/2021   HCT 39.2 04/29/2021   MCV 95.1 04/29/2021   PLT 287 04/29/2021   Lab Results  Component Value Date   NA 141 04/29/2021   K 4.3 04/29/2021   CO2 26 04/29/2021   GLUCOSE 98 04/29/2021   BUN 17 04/29/2021   CREATININE 0.95 04/29/2021   BILITOT 0.4 04/29/2021   ALKPHOS 55 01/25/2021   AST 16 04/29/2021   ALT 11 04/29/2021   PROT 6.3 04/29/2021   ALBUMIN 3.6 01/25/2021   CALCIUM 8.6 04/29/2021   ANIONGAP 8 01/25/2021   EGFR 83 04/29/2021   Lab Results  Component Value Date   CHOL 135 04/29/2021   Lab Results  Component Value Date   HDL 39 (L) 04/29/2021   Lab Results  Component Value Date   LDLCALC 82 04/29/2021   Lab Results  Component Value Date   TRIG 61 04/29/2021   Lab Results  Component Value Date   CHOLHDL 3.5 04/29/2021   No results found for: "HGBA1C"    Assessment & Plan:   Problem List Items Addressed This Visit       Cardiovascular and Mediastinum   Essential hypertension    The following hypertensive lifestyle modification were recommended and discussed:  1. Limiting alcohol intake to less than 1 oz/day of ethanol:(24 oz of beer or 8 oz of wine or 2 oz of 100-proof whiskey). 2. Take baby ASA 81 mg daily. 3. Importance of regular aerobic exercise and losing weight. 4. Reduce dietary saturated  fat and cholesterol intake for overall cardiovascular health. 5. Maintaining adequate dietary potassium, calcium, and magnesium intake. 6. Regular monitoring of the blood pressure. 7. Reduce sodium intake to less than 100 mmol/day (less than 2.3 gm of sodium or less than 6 gm of sodium choride)       Chronic venous insufficiency - Primary    Stable at the present time      Superficial thrombophlebitis    No longer present        Respiratory   Seasonal allergic rhinitis due to pollen    Take Claritin 10 mg p.o. daily        Musculoskeletal and Integument   Dupuytren's disease of palm    Stable at the present time        Other   Elevated PSA    Schedule an annual physical     Hypercholesterolemia  I advised the patient to follow Mediterranean diet This diet is rich in fruits vegetables and whole grain, and This diet is also rich in fish and lean meat Patient should also eat a handful of almonds or walnuts daily Recent heart study indicated that average follow-up on this kind of diet reduces the cardiovascular mortality by 50 to 70%==  No orders of the defined types were placed in this encounter.   Follow-up: No follow-ups on file.    Cletis [place], MD

## 2022-02-10 NOTE — Assessment & Plan Note (Signed)
No longer present  

## 2022-02-19 DIAGNOSIS — M955 Acquired deformity of pelvis: Secondary | ICD-10-CM | POA: Diagnosis not present

## 2022-02-19 DIAGNOSIS — M5136 Other intervertebral disc degeneration, lumbar region: Secondary | ICD-10-CM | POA: Diagnosis not present

## 2022-02-19 DIAGNOSIS — M9905 Segmental and somatic dysfunction of pelvic region: Secondary | ICD-10-CM | POA: Diagnosis not present

## 2022-02-19 DIAGNOSIS — M9903 Segmental and somatic dysfunction of lumbar region: Secondary | ICD-10-CM | POA: Diagnosis not present

## 2022-03-13 ENCOUNTER — Encounter: Payer: Self-pay | Admitting: Nurse Practitioner

## 2022-03-13 ENCOUNTER — Ambulatory Visit (INDEPENDENT_AMBULATORY_CARE_PROVIDER_SITE_OTHER): Payer: Medicare Other | Admitting: Nurse Practitioner

## 2022-03-13 VITALS — BP 138/84 | Ht 72.0 in | Wt 242.0 lb

## 2022-03-13 DIAGNOSIS — K219 Gastro-esophageal reflux disease without esophagitis: Secondary | ICD-10-CM | POA: Diagnosis not present

## 2022-03-13 DIAGNOSIS — I10 Essential (primary) hypertension: Secondary | ICD-10-CM | POA: Diagnosis not present

## 2022-03-13 DIAGNOSIS — E669 Obesity, unspecified: Secondary | ICD-10-CM | POA: Diagnosis not present

## 2022-03-13 DIAGNOSIS — Z6832 Body mass index (BMI) 32.0-32.9, adult: Secondary | ICD-10-CM

## 2022-03-13 NOTE — Progress Notes (Signed)
Established Patient Office Visit  Subjective:  Patient ID: Paul Tapley., male    DOB: 02/17/1946  Age: 76 y.o. MRN: [id number]  CC:  No chief complaint on file.    HPI Paul Lappe. presents for  routine follow up. Patient had a dentist appointment  few months ago and the densit told her that he has some calficatioin of the carotid arteries. Patient is concerned about the finding. His cholesterol was within normal limits on 04/19/2021.  HPI    Past Medical History:  Diagnosis Date   Colon, diverticulosis    GERD (gastroesophageal reflux disease)    History of colonic polyps    Internal hemorrhoids    Pneumonia     Past Surgical History:  Procedure Laterality Date   APPENDECTOMY  1963   COLONOSCOPY WITH PROPOFOL N/A 04/25/2018   Procedure: COLONOSCOPY WITH PROPOFOL;  Surgeon: Lollie Sails, MD;  Location: [facility] ENDOSCOPY;  Service: Endoscopy;  Laterality: N/A;   EYE SURGERY  2013   HERNIA REPAIR  1986    Family History  Problem Relation Age of Onset   Cancer Mother    Cancer Father    Cancer Maternal Uncle    Cancer Paternal Uncle    Heart attack Paternal Grandfather     Social History   Socioeconomic History   Marital status: Widowed    Spouse name: Not on file   Number of children: Not on file   Years of education: Not on file   Highest education level: Master's degree (e.g., MA, MS, MEng, MEd, MSW, MBA)  Occupational History   Not on file  Tobacco Use   Smoking status: Former    Years: 50.00    Types: Cigarettes    Quit date: 07/14/1967    Years since quitting: 36.7   Smokeless tobacco: Never  Vaping Use   Vaping Use: Never used  Substance and Sexual Activity   Alcohol use: Yes    Comment: occasional   Drug use: No   Sexual activity: Not Currently  Other Topics Concern   Not on file  Social History Narrative   Not on file   Social Determinants of Health   Financial Resource Strain: Low Risk  (12/25/2021)   Overall Financial  Resource Strain (CARDIA)    Difficulty of Paying Living Expenses: Not hard at all  Food Insecurity: No Food Insecurity (12/25/2021)   Hunger Vital Sign    Worried About Running Out of Food in the Last Year: Never true    [place] in the Last Year: Never true  Transportation Needs: No Transportation Needs (12/25/2021)   PRAPARE - Hydrologist (Medical): No    Lack of Transportation (Non-Medical): No  Physical Activity: Sufficiently Active (12/25/2021)   Exercise Vital Sign    Days of Exercise per Week: 5 days    Minutes of Exercise per Session: 40 min  Stress: No Stress Concern Present (12/25/2021)   [place]    Feeling of Stress : Not at all  Social Connections: Moderately Integrated (12/25/2021)   Social Connection and Isolation Panel [NHANES]    Frequency of Communication with Friends and Family: More than three times a week    Frequency of Social Gatherings with Friends and Family: More than three times a week    Attends Religious Services: More than 4 times per year    Active Member of Clubs or Organizations: No  Attends Archivist Meetings: More than 4 times per year    Marital Status: Widowed  Intimate Partner Violence: Not At Risk (12/25/2021)   Humiliation, Afraid, Rape, and Kick questionnaire    Fear of Current or Ex-Partner: No    Emotionally Abused: No    Physically Abused: No    Sexually Abused: No     Current Outpatient Medications:    aspirin EC 81 MG tablet, Take 81 mg by mouth daily. Swallow whole., Disp: , Rfl:    clobetasol cream (TEMOVATE) 6.23 %, Apply 1 application. topically as directed. Qd to bid aa itchy rash/eczema on body prn flares, avoid face, groin, axilla, Disp: 60 g, Rfl: 0   losartan (COZAAR) 100 MG tablet, TAKE 1 TABLET BY MOUTH EVERY DAY, Disp: 90 tablet, Rfl: 3   meloxicam (MOBIC) 15 MG tablet, Take 15 mg by mouth daily., Disp: , Rfl:     mupirocin ointment (BACTROBAN) 2 %, Apply 1 application topically daily., Disp: 22 g, Rfl: 0   No Known Allergies  ROS Review of Systems  Constitutional: Negative.  Negative for activity change.  HENT:  Negative for congestion and sore throat.   Eyes: Negative.   Cardiovascular:  Negative for chest pain and palpitations.  Gastrointestinal: Negative.   Endocrine: Negative.   Genitourinary: Negative.   Musculoskeletal: Negative.   Skin: Negative.   Allergic/Immunologic: Negative.   Neurological: Negative.   Hematological: Negative.   Psychiatric/Behavioral: Negative.    All other systems reviewed and are negative.     Objective:    Physical Exam Vitals reviewed.  Constitutional:      Appearance: Normal appearance.  HENT:     Right Ear: Tympanic membrane normal.     Left Ear: Tympanic membrane normal.     Mouth/Throat:     Mouth: Mucous membranes are moist.     Pharynx: No posterior oropharyngeal erythema.  Eyes:     Pupils: Pupils are equal, round, and reactive to light.  Neck:     Vascular: No carotid bruit.  Cardiovascular:     Rate and Rhythm: Normal rate and regular rhythm.     Pulses: Normal pulses.     Heart sounds: Normal heart sounds.  Pulmonary:     Effort: Pulmonary effort is normal.     Breath sounds: Normal breath sounds.  Abdominal:     General: Bowel sounds are normal.     Palpations: Abdomen is soft. There is no hepatomegaly, splenomegaly or mass.     Tenderness: There is no abdominal tenderness.     Hernia: No hernia is present.  Musculoskeletal:     Cervical back: Neck supple.     Right lower leg: No edema.     Left lower leg: No edema.  Skin:    Findings: No rash.  Neurological:     Mental Status: He is alert and oriented to person, place, and time.     Motor: No weakness.  Psychiatric:        Mood and Affect: Mood normal.        Behavior: Behavior normal.     BP 138/84   Ht 6' (1.829 m)   Wt 242 lb (109.8 kg)   BMI 32.82 kg/m   Wt Readings from Last 3 Encounters:  03/13/22 242 lb (109.8 kg)  02/10/22 238 lb 14.4 oz (108.4 kg)  01/23/22 248 lb 6.4 oz (112.7 kg)     Health Maintenance Due  Topic Date Due   COVID-19 Vaccine (3 -  Moderna risk series) 07/15/2020   INFLUENZA VACCINE  02/10/2022    There are no preventive care reminders to display for this patient.  Lab Results  Component Value Date   TSH 2.14 04/29/2021   Lab Results  Component Value Date   WBC 4.8 04/29/2021   HGB 13.5 04/29/2021   HCT 39.2 04/29/2021   MCV 95.1 04/29/2021   PLT 287 04/29/2021   Lab Results  Component Value Date   NA 141 04/29/2021   K 4.3 04/29/2021   CO2 26 04/29/2021   GLUCOSE 98 04/29/2021   BUN 17 04/29/2021   CREATININE 0.95 04/29/2021   BILITOT 0.4 04/29/2021   ALKPHOS 55 01/25/2021   AST 16 04/29/2021   ALT 11 04/29/2021   PROT 6.3 04/29/2021   ALBUMIN 3.6 01/25/2021   CALCIUM 8.6 04/29/2021   ANIONGAP 8 01/25/2021   EGFR 83 04/29/2021   Lab Results  Component Value Date   CHOL 135 04/29/2021   Lab Results  Component Value Date   HDL 39 (L) 04/29/2021   Lab Results  Component Value Date   LDLCALC 82 04/29/2021   Lab Results  Component Value Date   TRIG 61 04/29/2021   Lab Results  Component Value Date   CHOLHDL 3.5 04/29/2021   No results found for: "HGBA1C"    Assessment & Plan:   Problem List Items Addressed This Visit       Cardiovascular and Mediastinum   Essential hypertension - Primary    Patient BP 142/90 for the first time and 138/84 for the second time the office today Advised pt to follow a low sodium and heart healthy diet. Continue losartan 100 mg daily.         Digestive   Gastroesophageal reflux disease    Stable at present.        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 Advised pt to lose weight. Advised patient to avoid trans fat, fatty and fried  food. Follow a regular physical activity schedule.           Advised patient to sign a release form so that we can get the x-rays from the dentist.  No orders of the defined types were placed in this encounter.   Follow-up: Return in about 1 month (around 04/12/2022) for annual physical.    Theresia Lo, NP

## 2022-03-19 DIAGNOSIS — M9903 Segmental and somatic dysfunction of lumbar region: Secondary | ICD-10-CM | POA: Diagnosis not present

## 2022-03-19 DIAGNOSIS — M9905 Segmental and somatic dysfunction of pelvic region: Secondary | ICD-10-CM | POA: Diagnosis not present

## 2022-03-19 DIAGNOSIS — M955 Acquired deformity of pelvis: Secondary | ICD-10-CM | POA: Diagnosis not present

## 2022-03-19 DIAGNOSIS — M5136 Other intervertebral disc degeneration, lumbar region: Secondary | ICD-10-CM | POA: Diagnosis not present

## 2022-03-25 DIAGNOSIS — K219 Gastro-esophageal reflux disease without esophagitis: Secondary | ICD-10-CM | POA: Insufficient documentation

## 2022-03-25 DIAGNOSIS — E669 Obesity, unspecified: Secondary | ICD-10-CM | POA: Insufficient documentation

## 2022-03-25 DIAGNOSIS — E66811 Obesity, class 1: Secondary | ICD-10-CM | POA: Insufficient documentation

## 2022-03-25 NOTE — Assessment & Plan Note (Signed)
Body mass index is 32.82 kg/m. Advised pt to lose weight. Advised patient to avoid trans fat, fatty and fried food. Follow a regular physical activity schedule.

## 2022-03-25 NOTE — Assessment & Plan Note (Signed)
Stable at present.   

## 2022-03-25 NOTE — Assessment & Plan Note (Signed)
Patient BP 142/90 for the first time and 138/84 for the second time the office today Advised pt to follow a low sodium and heart healthy diet. Continue losartan 100 mg daily.

## 2022-03-26 ENCOUNTER — Encounter: Payer: Self-pay | Admitting: Podiatry

## 2022-03-26 ENCOUNTER — Ambulatory Visit (INDEPENDENT_AMBULATORY_CARE_PROVIDER_SITE_OTHER): Payer: Medicare Other | Admitting: Podiatry

## 2022-03-26 DIAGNOSIS — M79674 Pain in right toe(s): Secondary | ICD-10-CM

## 2022-03-26 DIAGNOSIS — I872 Venous insufficiency (chronic) (peripheral): Secondary | ICD-10-CM

## 2022-03-26 DIAGNOSIS — M79675 Pain in left toe(s): Secondary | ICD-10-CM

## 2022-03-26 DIAGNOSIS — M2042 Other hammer toe(s) (acquired), left foot: Secondary | ICD-10-CM

## 2022-03-26 DIAGNOSIS — B351 Tinea unguium: Secondary | ICD-10-CM

## 2022-03-26 NOTE — Progress Notes (Signed)
This patient returns to my office for at risk foot care.  This patient requires this care by a professional since this patient will be at risk due to having venous insufficiency.  This patient is unable to cut nails easily  himself since the patient cannot reach his nails.These nails are painful walking and wearing shoes.  This patient presents for at risk foot care today.  General Appearance  Alert, conversant and in no acute stress.  Vascular  Dorsalis pedis and posterior tibial  pulses are palpable  bilaterally.  Capillary return is within normal limits  bilaterally. Temperature is within normal limits  bilaterally.  Neurologic  Senn-Weinstein monofilament wire test within normal limits  bilaterally. Muscle power within normal limits bilaterally.  Nails Thick disfigured discolored nails with subungual debris  from hallux to fifth toes bilaterally. No evidence of bacterial infection or drainage bilaterally.  Orthopedic  No limitations of motion  feet .  No crepitus or effusions noted.  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    Skin  normotropic skin with no porokeratosis noted bilaterally.  No signs of infections or ulcers noted.     Onychomycosis  Pain in right toes  Pain in left toes  Consent was obtained for treatment procedures.   Mechanical debridement of nails 1-5  bilaterally performed with a nail nipper.  Filed with dremel without incident. Discussed his hammer toe second left foot.  Padding dispensed.   Return office visit   3 months.                  Told patient to return for periodic foot care and evaluation due to potential at risk complications.     Gardiner Barefoot DPM

## 2022-04-09 DIAGNOSIS — Z23 Encounter for immunization: Secondary | ICD-10-CM | POA: Diagnosis not present

## 2022-05-05 ENCOUNTER — Ambulatory Visit (INDEPENDENT_AMBULATORY_CARE_PROVIDER_SITE_OTHER): Payer: Medicare Other | Admitting: *Deleted

## 2022-05-05 DIAGNOSIS — I10 Essential (primary) hypertension: Secondary | ICD-10-CM | POA: Diagnosis not present

## 2022-05-05 DIAGNOSIS — E785 Hyperlipidemia, unspecified: Secondary | ICD-10-CM | POA: Diagnosis not present

## 2022-05-05 DIAGNOSIS — R972 Elevated prostate specific antigen [PSA]: Secondary | ICD-10-CM | POA: Diagnosis not present

## 2022-05-06 LAB — COMPLETE METABOLIC PANEL WITH GFR
AG Ratio: 1.8 (calc) (ref 1.0–2.5)
ALT: 15 U/L (ref 9–46)
AST: 20 U/L (ref 10–35)
Albumin: 4 g/dL (ref 3.6–5.1)
Alkaline phosphatase (APISO): 59 U/L (ref 35–144)
BUN: 18 mg/dL (ref 7–25)
CO2: 23 mmol/L (ref 20–32)
Calcium: 9 mg/dL (ref 8.6–10.3)
Chloride: 105 mmol/L (ref 98–110)
Creat: 1.05 mg/dL (ref 0.70–1.28)
Globulin: 2.2 g/dL (calc) (ref 1.9–3.7)
Glucose, Bld: 79 mg/dL (ref 65–99)
Potassium: 4.5 mmol/L (ref 3.5–5.3)
Sodium: 140 mmol/L (ref 135–146)
Total Bilirubin: 0.5 mg/dL (ref 0.2–1.2)
Total Protein: 6.2 g/dL (ref 6.1–8.1)
eGFR: 73 mL/min/{1.73_m2} (ref >=60)

## 2022-05-06 LAB — LIPID PANEL
Cholesterol: 142 mg/dL (ref <200)
HDL: 50 mg/dL (ref >=40)
LDL Cholesterol (Calc): 78 mg/dL (calc)
Non-HDL Cholesterol (Calc): 92 mg/dL (calc) (ref <130)
Total CHOL/HDL Ratio: 2.8 (calc) (ref <5.0)
Triglycerides: 55 mg/dL (ref <150)

## 2022-05-06 LAB — CBC WITH DIFFERENTIAL/PLATELET
Absolute Monocytes: 362 cells/uL (ref 200–950)
Basophils Absolute: 42 cells/uL (ref 0–200)
Basophils Relative: 0.9 %
Eosinophils Absolute: 221 cells/uL (ref 15–500)
Eosinophils Relative: 4.7 %
HCT: 42.8 % (ref 38.5–50.0)
Hemoglobin: 14.5 g/dL (ref 13.2–17.1)
Lymphs Abs: 1227 cells/uL (ref 850–3900)
MCH: 32.5 pg (ref 27.0–33.0)
MCHC: 33.9 g/dL (ref 32.0–36.0)
MCV: 96 fL (ref 80.0–100.0)
MPV: 10 fL (ref 7.5–12.5)
Monocytes Relative: 7.7 %
Neutro Abs: 2848 cells/uL (ref 1500–7800)
Neutrophils Relative %: 60.6 %
Platelets: 234 10*3/uL (ref 140–400)
RBC: 4.46 10*6/uL (ref 4.20–5.80)
RDW: 13.1 % (ref 11.0–15.0)
Total Lymphocyte: 26.1 %
WBC: 4.7 10*3/uL (ref 3.8–10.8)

## 2022-05-06 LAB — PSA: PSA: 0.66 ng/mL (ref <=4.00)

## 2022-05-06 LAB — TSH: TSH: 1.99 mIU/L (ref 0.40–4.50)

## 2022-05-11 ENCOUNTER — Encounter (INDEPENDENT_AMBULATORY_CARE_PROVIDER_SITE_OTHER): Payer: Self-pay

## 2022-05-13 DIAGNOSIS — Z23 Encounter for immunization: Secondary | ICD-10-CM | POA: Diagnosis not present

## 2022-05-14 DIAGNOSIS — M9903 Segmental and somatic dysfunction of lumbar region: Secondary | ICD-10-CM | POA: Diagnosis not present

## 2022-05-14 DIAGNOSIS — M955 Acquired deformity of pelvis: Secondary | ICD-10-CM | POA: Diagnosis not present

## 2022-05-14 DIAGNOSIS — M5136 Other intervertebral disc degeneration, lumbar region: Secondary | ICD-10-CM | POA: Diagnosis not present

## 2022-05-14 DIAGNOSIS — M9905 Segmental and somatic dysfunction of pelvic region: Secondary | ICD-10-CM | POA: Diagnosis not present

## 2022-05-19 DIAGNOSIS — M9905 Segmental and somatic dysfunction of pelvic region: Secondary | ICD-10-CM | POA: Diagnosis not present

## 2022-05-19 DIAGNOSIS — M9903 Segmental and somatic dysfunction of lumbar region: Secondary | ICD-10-CM | POA: Diagnosis not present

## 2022-05-19 DIAGNOSIS — M5136 Other intervertebral disc degeneration, lumbar region: Secondary | ICD-10-CM | POA: Diagnosis not present

## 2022-05-19 DIAGNOSIS — M955 Acquired deformity of pelvis: Secondary | ICD-10-CM | POA: Diagnosis not present

## 2022-05-20 NOTE — Progress Notes (Signed)
Established Patient Office Visit  Subjective:  Patient ID: Paul Buckley., male    DOB: 11-18-1945  Age: 76 y.o. MRN: [id number]  CC:  Chief Complaint  Patient presents with   Results     HPI  [name]. presents for lab review. HPI   Past Medical History:  Diagnosis Date   Colon, diverticulosis    GERD (gastroesophageal reflux disease)    History of colonic polyps    Internal hemorrhoids    Pneumonia     Past Surgical History:  Procedure Laterality Date   APPENDECTOMY  1963   COLONOSCOPY WITH PROPOFOL N/A 04/25/2018   Procedure: COLONOSCOPY WITH PROPOFOL;  Surgeon: Lollie Sails, MD;  Location: [facility] ENDOSCOPY;  Service: Endoscopy;  Laterality: N/A;   EYE SURGERY  2013   HERNIA REPAIR  1986    Family History  Problem Relation Age of Onset   Cancer Mother    Cancer Father    Cancer Maternal Uncle    Cancer Paternal Uncle    Heart attack Paternal Grandfather     Social History   Socioeconomic History   Marital status: Widowed    Spouse name: Not on file   Number of children: Not on file   Years of education: Not on file   Highest education level: Master's degree (e.g., MA, MS, MEng, MEd, MSW, MBA)  Occupational History   Not on file  Tobacco Use   Smoking status: Former    Years: 50.00    Types: Cigarettes    Quit date: 07/14/1967    Years since quitting: 54.9   Smokeless tobacco: Never  Vaping Use   Vaping Use: Never used  Substance and Sexual Activity   Alcohol use: Yes    Comment: occasional   Drug use: No   Sexual activity: Not Currently  Other Topics Concern   Not on file  Social History Narrative   Not on file   Social Determinants of Health   Financial Resource Strain: Low Risk  (12/25/2021)   Overall Financial Resource Strain (CARDIA)    Difficulty of Paying Living Expenses: Not hard at all  Food Insecurity: No Food Insecurity (12/25/2021)   Hunger Vital Sign    Worried About Running Out of Food in the Last Year: Never  true    [place] in the Last Year: Never true  Transportation Needs: No Transportation Needs (12/25/2021)   PRAPARE - Hydrologist (Medical): No    Lack of Transportation (Non-Medical): No  Physical Activity: Sufficiently Active (12/25/2021)   Exercise Vital Sign    Days of Exercise per Week: 5 days    Minutes of Exercise per Session: 40 min  Stress: No Stress Concern Present (12/25/2021)   [place]    Feeling of Stress : Not at all  Social Connections: Moderately Integrated (12/25/2021)   Social Connection and Isolation Panel [NHANES]    Frequency of Communication with Friends and Family: More than three times a week    Frequency of Social Gatherings with Friends and Family: More than three times a week    Attends Religious Services: More than 4 times per year    Active Member of [employer] or Organizations: No    Attends Music therapist: More than 4 times per year    Marital Status: Widowed  Intimate Partner Violence: Not At Risk (12/25/2021)   Humiliation, Afraid, Rape, and Kick  questionnaire    Fear of Current or Ex-Partner: No    Emotionally Abused: No    Physically Abused: No    Sexually Abused: No     Outpatient Medications Prior to Visit  Medication Sig Dispense Refill   aspirin EC 81 MG tablet Take 81 mg by mouth daily. Swallow whole.     losartan (COZAAR) 100 MG tablet TAKE 1 TABLET BY MOUTH EVERY DAY 90 tablet 3   meloxicam (MOBIC) 15 MG tablet Take 15 mg by mouth daily.     No facility-administered medications prior to visit.    No Known Allergies  ROS Review of Systems  Constitutional: Negative.   HENT: Negative.    Eyes: Negative.   Respiratory:  Negative for chest tightness and shortness of breath.   Cardiovascular:  Negative for chest pain and palpitations.  Gastrointestinal: Negative.   Endocrine: Negative.   Genitourinary: Negative.    Musculoskeletal:  Positive for arthralgias.  Skin: Negative.   Neurological:  Negative for dizziness, facial asymmetry and headaches.  Psychiatric/Behavioral: Negative.        Objective:    Physical Exam Constitutional:      Appearance: Normal appearance. He is obese.  HENT:     Right Ear: Tympanic membrane normal.     Left Ear: Tympanic membrane normal.     Mouth/Throat:     Mouth: Mucous membranes are moist.  Eyes:     Extraocular Movements: Extraocular movements intact.     Conjunctiva/sclera: Conjunctivae normal.     Pupils: Pupils are equal, round, and reactive to light.  Cardiovascular:     Rate and Rhythm: Normal rate and regular rhythm.     Pulses: Normal pulses.     Heart sounds: Normal heart sounds.  Pulmonary:     Effort: Pulmonary effort is normal.     Breath sounds: Normal breath sounds.  Abdominal:     General: Bowel sounds are normal. There is distension.     Palpations: Abdomen is soft.     Hernia: No hernia is present.  Musculoskeletal:        General: Normal range of motion.  Skin:    General: Skin is warm.  Neurological:     General: No focal deficit present.     Mental Status: He is alert and oriented to person, place, and time. Mental status is at baseline.  Psychiatric:        Mood and Affect: Mood normal.        Behavior: Behavior normal.        Thought Content: Thought content normal.        Judgment: Judgment normal.     BP 134/74   Pulse 73   Temp 97.6 F (36.4 C) (Temporal)   Ht 6' (1.829 m)   Wt 239 lb 9.6 oz (108.7 kg)   SpO2 97%   BMI 32.50 kg/m  Wt Readings from Last 3 Encounters:  05/21/22 239 lb 9.6 oz (108.7 kg)  03/13/22 242 lb (109.8 kg)  02/10/22 238 lb 14.4 oz (108.4 kg)     Health Maintenance  Topic Date Due   COVID-19 Vaccine (3 - Moderna risk series) 06/10/2022   Medicare Annual Wellness (AWV)  12/26/2022   Pneumonia Vaccine 39+ Years old  Completed   INFLUENZA VACCINE  Completed   Hepatitis C Screening   Completed   Zoster Vaccines- Shingrix  Completed   HPV VACCINES  Aged Out   COLONOSCOPY (Pts 45-50yr Insurance coverage will need to be confirmed)  Discontinued    There are no preventive care reminders to display for this patient.  Lab Results  Component Value Date   TSH 1.99 05/05/2022   Lab Results  Component Value Date   WBC 4.7 05/05/2022   HGB 14.5 05/05/2022   HCT 42.8 05/05/2022   MCV 96.0 05/05/2022   PLT 234 05/05/2022   Lab Results  Component Value Date   NA 140 05/05/2022   K 4.5 05/05/2022   CO2 23 05/05/2022   GLUCOSE 79 05/05/2022   BUN 18 05/05/2022   CREATININE 1.05 05/05/2022   BILITOT 0.5 05/05/2022   ALKPHOS 55 01/25/2021   AST 20 05/05/2022   ALT 15 05/05/2022   PROT 6.2 05/05/2022   ALBUMIN 3.6 01/25/2021   CALCIUM 9.0 05/05/2022   ANIONGAP 8 01/25/2021   EGFR 73 05/05/2022   Lab Results  Component Value Date   CHOL 142 05/05/2022   Lab Results  Component Value Date   HDL 50 05/05/2022   Lab Results  Component Value Date   LDLCALC 78 05/05/2022   Lab Results  Component Value Date   TRIG 55 05/05/2022   Lab Results  Component Value Date   CHOLHDL 2.8 05/05/2022   No results found for: "HGBA1C"    Assessment & Plan:   Problem List Items Addressed This Visit       Cardiovascular and Mediastinum   Essential hypertension    Patient BP  Vitals:   05/21/22 0914 05/21/22 0915  BP: (!) 140/77 134/74    in the office today. Encouraged him to follow a low sodium and heart healthy diet.          Other   Dyslipidemia - Primary    Labs within normal limit  Encouraged him to consume balanced diet and follow regular exercise routine.        No orders of the defined types were placed in this encounter.    Follow-up: No follow-ups on file.    Theresia Lo, NP

## 2022-05-21 ENCOUNTER — Ambulatory Visit (INDEPENDENT_AMBULATORY_CARE_PROVIDER_SITE_OTHER): Payer: Medicare Other | Admitting: Nurse Practitioner

## 2022-05-21 ENCOUNTER — Encounter: Payer: Self-pay | Admitting: Nurse Practitioner

## 2022-05-21 VITALS — BP 134/74 | HR 73 | Temp 97.6°F | Ht 72.0 in | Wt 239.6 lb

## 2022-05-21 DIAGNOSIS — I10 Essential (primary) hypertension: Secondary | ICD-10-CM

## 2022-05-21 DIAGNOSIS — E785 Hyperlipidemia, unspecified: Secondary | ICD-10-CM | POA: Diagnosis not present

## 2022-05-26 DIAGNOSIS — M9903 Segmental and somatic dysfunction of lumbar region: Secondary | ICD-10-CM | POA: Diagnosis not present

## 2022-05-26 DIAGNOSIS — M955 Acquired deformity of pelvis: Secondary | ICD-10-CM | POA: Diagnosis not present

## 2022-05-26 DIAGNOSIS — M5136 Other intervertebral disc degeneration, lumbar region: Secondary | ICD-10-CM | POA: Diagnosis not present

## 2022-05-26 DIAGNOSIS — M9905 Segmental and somatic dysfunction of pelvic region: Secondary | ICD-10-CM | POA: Diagnosis not present

## 2022-06-03 DIAGNOSIS — M9903 Segmental and somatic dysfunction of lumbar region: Secondary | ICD-10-CM | POA: Diagnosis not present

## 2022-06-03 DIAGNOSIS — M9905 Segmental and somatic dysfunction of pelvic region: Secondary | ICD-10-CM | POA: Diagnosis not present

## 2022-06-03 DIAGNOSIS — M955 Acquired deformity of pelvis: Secondary | ICD-10-CM | POA: Diagnosis not present

## 2022-06-03 DIAGNOSIS — M5136 Other intervertebral disc degeneration, lumbar region: Secondary | ICD-10-CM | POA: Diagnosis not present

## 2022-06-05 ENCOUNTER — Encounter: Payer: Self-pay | Admitting: Nurse Practitioner

## 2022-06-05 NOTE — Assessment & Plan Note (Signed)
Labs within normal limit  Encouraged him to consume balanced diet and follow regular exercise routine.

## 2022-06-05 NOTE — Assessment & Plan Note (Signed)
Patient BP  Vitals:   05/21/22 0914 05/21/22 0915  BP: (!) 140/77 134/74    in the office today. Encouraged him to follow a low sodium and heart healthy diet.

## 2022-06-08 DIAGNOSIS — H2513 Age-related nuclear cataract, bilateral: Secondary | ICD-10-CM | POA: Diagnosis not present

## 2022-06-09 DIAGNOSIS — M955 Acquired deformity of pelvis: Secondary | ICD-10-CM | POA: Diagnosis not present

## 2022-06-09 DIAGNOSIS — M5136 Other intervertebral disc degeneration, lumbar region: Secondary | ICD-10-CM | POA: Diagnosis not present

## 2022-06-09 DIAGNOSIS — M9903 Segmental and somatic dysfunction of lumbar region: Secondary | ICD-10-CM | POA: Diagnosis not present

## 2022-06-09 DIAGNOSIS — M9905 Segmental and somatic dysfunction of pelvic region: Secondary | ICD-10-CM | POA: Diagnosis not present

## 2022-06-23 DIAGNOSIS — M9905 Segmental and somatic dysfunction of pelvic region: Secondary | ICD-10-CM | POA: Diagnosis not present

## 2022-06-23 DIAGNOSIS — M5136 Other intervertebral disc degeneration, lumbar region: Secondary | ICD-10-CM | POA: Diagnosis not present

## 2022-06-23 DIAGNOSIS — M955 Acquired deformity of pelvis: Secondary | ICD-10-CM | POA: Diagnosis not present

## 2022-06-23 DIAGNOSIS — M9903 Segmental and somatic dysfunction of lumbar region: Secondary | ICD-10-CM | POA: Diagnosis not present

## 2022-06-25 ENCOUNTER — Encounter: Payer: Self-pay | Admitting: Podiatry

## 2022-06-25 ENCOUNTER — Ambulatory Visit (INDEPENDENT_AMBULATORY_CARE_PROVIDER_SITE_OTHER): Payer: Medicare Other | Admitting: Podiatry

## 2022-06-25 VITALS — BP 132/66 | HR 73

## 2022-06-25 DIAGNOSIS — M79675 Pain in left toe(s): Secondary | ICD-10-CM

## 2022-06-25 DIAGNOSIS — M2042 Other hammer toe(s) (acquired), left foot: Secondary | ICD-10-CM

## 2022-06-25 DIAGNOSIS — M79674 Pain in right toe(s): Secondary | ICD-10-CM | POA: Diagnosis not present

## 2022-06-25 DIAGNOSIS — B351 Tinea unguium: Secondary | ICD-10-CM | POA: Diagnosis not present

## 2022-06-25 DIAGNOSIS — I872 Venous insufficiency (chronic) (peripheral): Secondary | ICD-10-CM

## 2022-06-25 NOTE — Progress Notes (Signed)
This patient returns to my office for at risk foot care.  This patient requires this care by a professional since this patient will be at risk due to having venous insufficiency.  This patient is unable to cut nails easily  himself since the patient cannot reach his nails.These nails are painful walking and wearing shoes.  This patient presents for at risk foot care today.  General Appearance  Alert, conversant and in no acute stress.  Vascular  Dorsalis pedis and posterior tibial  pulses are palpable  bilaterally.  Capillary return is within normal limits  bilaterally. Temperature is within normal limits  bilaterally.  Neurologic  Senn-Weinstein monofilament wire test within normal limits  bilaterally. Muscle power within normal limits bilaterally.  Nails Thick disfigured discolored nails with subungual debris  from hallux to fifth toes bilaterally. No evidence of bacterial infection or drainage bilaterally.  Orthopedic  No limitations of motion  feet .  No crepitus or effusions noted.  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    Skin  normotropic skin with no porokeratosis noted bilaterally.  No signs of infections or ulcers noted.     Onychomycosis  Pain in right toes  Pain in left toes  Consent was obtained for treatment procedures.   Mechanical debridement of nails 1-5  bilaterally performed with a nail nipper.  Filed with dremel without incident   Return office visit   3 months.                  Told patient to return for periodic foot care and evaluation due to potential at risk complications.     Gardiner Barefoot DPM

## 2022-07-03 ENCOUNTER — Other Ambulatory Visit: Payer: Self-pay | Admitting: Internal Medicine

## 2022-10-01 ENCOUNTER — Encounter: Payer: Self-pay | Admitting: Podiatry

## 2022-10-01 ENCOUNTER — Ambulatory Visit (INDEPENDENT_AMBULATORY_CARE_PROVIDER_SITE_OTHER): Payer: Medicare Other | Admitting: Podiatry

## 2022-10-01 VITALS — BP 155/77 | HR 72

## 2022-10-01 DIAGNOSIS — M2042 Other hammer toe(s) (acquired), left foot: Secondary | ICD-10-CM

## 2022-10-01 DIAGNOSIS — M79674 Pain in right toe(s): Secondary | ICD-10-CM

## 2022-10-01 DIAGNOSIS — M79675 Pain in left toe(s): Secondary | ICD-10-CM | POA: Diagnosis not present

## 2022-10-01 DIAGNOSIS — B351 Tinea unguium: Secondary | ICD-10-CM

## 2022-10-01 NOTE — Progress Notes (Signed)
This patient returns to my office for at risk foot care.  This patient requires this care by a professional since this patient will be at risk due to having venous insufficiency.  This patient is unable to cut nails easily  himself since the patient cannot reach his nails.These nails are painful walking and wearing shoes.  This patient presents for at risk foot care today.  General Appearance  Alert, conversant and in no acute stress.  Vascular  Dorsalis pedis and posterior tibial  pulses are palpable  bilaterally.  Capillary return is within normal limits  bilaterally. Temperature is within normal limits  bilaterally.  Neurologic  Senn-Weinstein monofilament wire test within normal limits  bilaterally. Muscle power within normal limits bilaterally.  Nails Thick disfigured discolored nails with subungual debris  from hallux to fifth toes bilaterally. No evidence of bacterial infection or drainage bilaterally.  Orthopedic  No limitations of motion  feet .  No crepitus or effusions noted.  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    Skin  normotropic skin with no porokeratosis noted bilaterally.  No signs of infections or ulcers noted.     Onychomycosis  Pain in right toes  Pain in left toes  Hammer toe 3rd left foot.  Consent was obtained for treatment procedures.   Mechanical debridement of nails 1-5  bilaterally performed with a nail nipper.  Filed with dremel without incident   Return office visit   3 months.                  Told patient to return for periodic foot care and evaluation due to potential at risk complications.     Gardiner Barefoot DPM

## 2022-11-19 ENCOUNTER — Ambulatory Visit: Payer: Medicare Other | Admitting: Nurse Practitioner

## 2022-12-31 ENCOUNTER — Ambulatory Visit (INDEPENDENT_AMBULATORY_CARE_PROVIDER_SITE_OTHER): Payer: Medicare Other | Admitting: Podiatry

## 2022-12-31 ENCOUNTER — Encounter: Payer: Self-pay | Admitting: Podiatry

## 2022-12-31 VITALS — BP 174/84

## 2022-12-31 DIAGNOSIS — B351 Tinea unguium: Secondary | ICD-10-CM | POA: Diagnosis not present

## 2022-12-31 DIAGNOSIS — M79675 Pain in left toe(s): Secondary | ICD-10-CM

## 2022-12-31 DIAGNOSIS — M79674 Pain in right toe(s): Secondary | ICD-10-CM

## 2022-12-31 NOTE — Progress Notes (Signed)
  Subjective:  Patient ID: Paul Buckley., male    DOB: 11/27/1945,  MRN: [id number]  Paul Buckley. presents to clinic today for: painful elongated mycotic toenails 1-5 bilaterally which are tender when wearing enclosed shoe gear. Pain is relieved with periodic professional debridement. Patient states he drank hot tea this am and it increases his bp. States he is feeling fine this am. Chief Complaint  Patient presents with   Nail Problem    RFC,Referring Provider Corky Downs, MD,LOV:6 wks ago      PCP is Corky Downs, MD.  No Known Allergies  Review of Systems: Negative except as noted in the HPI.  Objective: No changes noted in today's physical examination. Vitals:   12/31/22 0936  BP: (!) 174/84   Paul Buckley. is a pleasant 77 y.o. male in NAD. AAO x 3.  Vascular Examination: Capillary refill time <3 seconds b/l LE. Palpable pedal pulses b/l LE. Digital hair sparse b/l. No pedal edema b/l. Skin temperature gradient WNL b/l. Varicosities present b/l.[name]  Dermatological Examination: Pedal skin with normal turgor, texture and tone b/l. No open wounds. No interdigital macerations b/l. Toenails 1-5 b/l thickened, discolored, dystrophic with subungual debris. There is pain on palpation to dorsal aspect of nailplates. No hyperkeratotic nor porokeratotic lesions present on today's visit.[name]  Neurological Examination: Protective sensation intact with 10 gram monofilament b/l LE. Vibratory sensation intact b/l LE.   Musculoskeletal Examination: Normal muscle strength 5/5 to all lower extremity muscle groups bilaterally. 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 No pain, crepitus or joint limitation noted with ROM b/l LE.  Patient ambulates independently without assistive aids.  Assessment/Plan: 1. Pain due to onychomycosis of toenails of both feet    -Consent given for treatment as described below: -Examined patient. -Discussed elevated bp with  patient. He is asymptomatic. Advised him to recheck bp later today and contact PCP/Cardiologist if it remains elevated. He acknowledged verbal instructions. -Patient to continue soft, supportive shoe gear daily. -Toenails 1-5 b/l were debrided in length and girth with sterile nail nippers and dremel without iatrogenic bleeding.  -Patient/POA to call should there be question/concern in the interim.   Return in about 3 months (around 04/02/2023).  Freddie Breech, DPM

## 2023-04-15 ENCOUNTER — Ambulatory Visit (INDEPENDENT_AMBULATORY_CARE_PROVIDER_SITE_OTHER): Payer: Medicare Other | Admitting: Podiatry

## 2023-04-15 ENCOUNTER — Encounter: Payer: Self-pay | Admitting: Podiatry

## 2023-04-15 DIAGNOSIS — M79674 Pain in right toe(s): Secondary | ICD-10-CM

## 2023-04-15 DIAGNOSIS — M79675 Pain in left toe(s): Secondary | ICD-10-CM

## 2023-04-15 DIAGNOSIS — B351 Tinea unguium: Secondary | ICD-10-CM

## 2023-04-18 ENCOUNTER — Encounter: Payer: Self-pay | Admitting: Podiatry

## 2023-04-18 NOTE — Progress Notes (Signed)
  Subjective:  Patient ID: Paul Buckley., male    DOB: 09-30-1945,  MRN: [id number]  Paul Buckley. presents to clinic today for: painful thick toenails that are difficult to trim. Pain interferes with ambulation. Aggravating factors include wearing enclosed shoe gear. Pain is relieved with periodic professional debridement.  Chief Complaint  Patient presents with   RFC    RFC    PCP is Corky Downs, MD.  No Known Allergies  Review of Systems: Negative except as noted in the HPI.  Objective: No changes noted in today's physical examination. There were no vitals filed for this visit.  Paul Buckley. is a pleasant 77 y.o. male in NAD. AAO x 3.  Vascular Examination: Capillary refill time <3 seconds b/l LE. Palpable pedal pulses b/l LE. Digital hair present b/l. No pedal edema b/l. Skin temperature gradient WNL b/l. Varicosities present b/l.[name]  Dermatological Examination: Pedal skin with normal turgor, texture and tone b/l. No open wounds. No interdigital macerations b/l. Toenails 1-5 b/l thickened, discolored, dystrophic with subungual debris. There is pain on palpation to dorsal aspect of nailplates. No corns, calluses nor porokeratotic lesions noted..  Neurological Examination: Protective sensation intact with 10 gram monofilament b/l LE. Vibratory sensation intact b/l LE.   Musculoskeletal Examination: Muscle strength 5/5 to all lower extremity muscle groups bilaterally. Limited joint ROM to the 1st met cuneiform joint b/l.  Assessment/Plan: 1. Pain due to onychomycosis of toenails of both feet    -Patient was evaluated and treated. All patient's and/or POA's questions/concerns answered on today's visit. -Continue supportive shoe gear daily. -Mycotic toenails 1-5 bilaterally were debrided in length and girth with sterile nail nippers and dremel without incident. -Patient/POA to call should there be question/concern in the interim.   Return in about 3 months (around  07/16/2023).  Freddie Breech, DPM

## 2023-06-25 ENCOUNTER — Other Ambulatory Visit: Payer: Self-pay | Admitting: Internal Medicine

## 2023-07-16 ENCOUNTER — Ambulatory Visit (INDEPENDENT_AMBULATORY_CARE_PROVIDER_SITE_OTHER): Payer: Medicare Other | Admitting: Podiatry

## 2023-07-16 ENCOUNTER — Encounter: Payer: Self-pay | Admitting: Podiatry

## 2023-07-16 VITALS — Ht 72.0 in | Wt 239.0 lb

## 2023-07-16 DIAGNOSIS — M79674 Pain in right toe(s): Secondary | ICD-10-CM | POA: Diagnosis not present

## 2023-07-16 DIAGNOSIS — M79675 Pain in left toe(s): Secondary | ICD-10-CM | POA: Diagnosis not present

## 2023-07-16 DIAGNOSIS — B351 Tinea unguium: Secondary | ICD-10-CM | POA: Diagnosis not present

## 2023-07-16 NOTE — Progress Notes (Signed)
  Subjective:  Patient ID: Paul Buckley., male    DOB: 26-Oct-1945,  MRN: [id number]  Paul Buckley. presents to clinic today for: painful thick toenails that are difficult to trim. Pain interferes with ambulation. Aggravating factors include wearing enclosed shoe gear. Pain is relieved with periodic professional debridement.   PCP is Britta King, MD.  No Known Allergies  Review of Systems: Negative except as noted in the HPI.  Objective: No changes noted in today's physical examination. There were no vitals filed for this visit.  Paul Buckley. is a pleasant 78 y.o. male in NAD. AAO x 3.  Vascular Examination: Capillary refill time <3 seconds b/l LE. Palpable pedal pulses b/l LE. Digital hair present b/l. No pedal edema b/l. Skin temperature gradient WNL b/l. No varicosities b/l. [name]  Dermatological Examination: Pedal skin with normal turgor, texture and tone b/l. No open wounds. No interdigital macerations b/l. Toenails 1-5 b/l thickened, discolored, dystrophic with subungual debris. There is pain on palpation to dorsal aspect of nailplates. No hyperkeratotic nor porokeratotic lesions present on today's visit.[name]  Neurological Examination: Protective sensation intact with 10 gram monofilament b/l LE. Vibratory sensation intact b/l LE.   Musculoskeletal Examination: Muscle strength 5/5 to all lower extremity muscle groups bilaterally. Limited joint ROM to the 1st met cuneiform joint b/l.  Assessment/Plan: 1. Pain due to onychomycosis of toenails of both feet    Patient was evaluated and treated. All patient's and/or POA's questions/concerns addressed on today's visit. Toenails 1-5 debrided in length and girth without incident. Continue soft, supportive shoe gear daily. Report any pedal injuries to medical professional. Call office if there are any questions/concerns. -Patient/POA to call should there be question/concern in the interim.   Return in about 3 months (around  10/14/2023).  Delon LITTIE Merlin, DPM      [place] LOCATION: [address] [place] [address]                   Office [phone]   [facility] LOCATION: [address] [place], [place] [address] Office [phone]

## 2023-10-14 ENCOUNTER — Encounter: Payer: Self-pay | Admitting: Podiatry

## 2023-10-14 ENCOUNTER — Ambulatory Visit (INDEPENDENT_AMBULATORY_CARE_PROVIDER_SITE_OTHER): Payer: Medicare Other | Admitting: Podiatry

## 2023-10-14 DIAGNOSIS — B351 Tinea unguium: Secondary | ICD-10-CM | POA: Diagnosis not present

## 2023-10-14 DIAGNOSIS — M79675 Pain in left toe(s): Secondary | ICD-10-CM

## 2023-10-14 DIAGNOSIS — M79674 Pain in right toe(s): Secondary | ICD-10-CM

## 2023-10-18 ENCOUNTER — Encounter: Payer: Self-pay | Admitting: Podiatry

## 2023-10-18 NOTE — Progress Notes (Signed)
  Subjective:  Patient ID: Paul Curry., male    DOB: Dec 11, 1945,  MRN: [id number]  78 y.o. male presents to clinic with  painful, elongated thickened toenails x 10 which are symptomatic when wearing enclosed shoe gear. This interferes with his/her daily activities.  Chief Complaint  Patient presents with   Nail Problem    "Trim the toenails."   New problem(s): None   PCP is Paul Downs, MD.  No Known Allergies  Review of Systems: Negative except as noted in the HPI.   Objective:  Paul Etienne. is a pleasant 78 y.o. male in NAD. AAO x 3.  Vascular Examination: Vascular status intact b/l with palpable pedal pulses. CFT immediate b/l. No edema. No pain with calf compression b/l. Skin temperature gradient WNL b/l. No cyanosis or clubbing noted b/l LE.  Neurological Examination: Sensation grossly intact b/l with 10 gram monofilament.   Dermatological Examination: Pedal skin with normal turgor, texture and tone b/l. Toenails 1-5 b/l thick, discolored, elongated with subungual debris and pain on dorsal palpation. No hyperkeratotic lesions noted b/l.   Musculoskeletal Examination: Muscle strength 5/5 to b/l LE. Limited joint ROM to the 1st met cuneiform joint.  Radiographs: None  Last A1c:       No data to display         Assessment:   1. Pain due to onychomycosis of toenails of both feet    Plan:  Consent given for treatment. Patient examined. All patient's and/or POA's questions/concerns addressed on today's visit. Mycotic toenails 1-5 debrided in length and girth without incident. Continue soft, supportive shoe gear daily. Report any pedal injuries to medical professional. Call office if there are any quesitons/concerns. -Patient/POA to call should there be question/concern in the interim.  Return in about 3 months (around 01/13/2024).  Freddie Breech, DPM      [place] LOCATION: [address], [place] [address]                   Office [phone]   [facility] LOCATION: [address] [place], [place] [address] Office [phone]

## 2023-11-01 ENCOUNTER — Ambulatory Visit: Admitting: Physician Assistant

## 2023-11-02 ENCOUNTER — Ambulatory Visit (INDEPENDENT_AMBULATORY_CARE_PROVIDER_SITE_OTHER): Admitting: Nurse Practitioner

## 2023-11-02 ENCOUNTER — Encounter: Payer: Self-pay | Admitting: Nurse Practitioner

## 2023-11-02 VITALS — BP 150/80 | HR 85 | Temp 98.2°F | Resp 16 | Ht 72.0 in | Wt 235.8 lb

## 2023-11-02 DIAGNOSIS — I10 Essential (primary) hypertension: Secondary | ICD-10-CM | POA: Diagnosis not present

## 2023-11-02 DIAGNOSIS — G8929 Other chronic pain: Secondary | ICD-10-CM | POA: Diagnosis not present

## 2023-11-02 DIAGNOSIS — E66811 Obesity, class 1: Secondary | ICD-10-CM

## 2023-11-02 DIAGNOSIS — M545 Low back pain, unspecified: Secondary | ICD-10-CM

## 2023-11-02 DIAGNOSIS — E6609 Other obesity due to excess calories: Secondary | ICD-10-CM

## 2023-11-02 MED ORDER — LOSARTAN POTASSIUM 100 MG PO TABS: 100.0000 mg | ORAL_TABLET | Freq: Every day | ORAL | 3 refills | Status: AC

## 2023-11-02 NOTE — Progress Notes (Signed)
 [facility] [address] [place], [place] [address]  Internal MEDICINE  Office Visit Note  Patient Name: Paul Buckley  [id number]  [id number]  Date of Service: 11/02/2023   Complaints/HPI Pt is here for establishment of PCP. Chief Complaint  Patient presents with   New Patient (Initial Visit)    HPI [name] presents for a new patient visit to establish care.  Well-appearing 78 y.o. male with hypertension, chronic low back pain and chronic knee pain.  Work: retired  Home: live at home alone  Diet: fair Exercise: walking  Tobacco use: none Alcohol use: drinks a manhatten at 5 pm in the evenings. Has not had a drink fo the past 6 weeks due to lent and now due to side effects of new medications he is on.  Illicit drug use: none  Routine CRC screening: scheduled for colonoscopy on 11/16/23 Labs: had labs done in november and due again in november this year, labs were normal.  New or worsening pain: recently threw out back and is seeing orthopedics. Had some chronic low back pain prior to this as well  Last AWV was done in November last year.  Blood pressure is well controlled with losartan . He stopped taking it recently due to the orthopedic doctor starting him on prednisone, gabapentin and methocarbamol and patient reports he did not know if losartan  was ok to take with these medications. --checked interactions and his medications are ok to take together.    Current Medication: Outpatient Encounter Medications as of 11/02/2023  Medication Sig   aspirin EC 81 MG tablet Take 81 mg by mouth daily. Swallow whole.   gabapentin (NEURONTIN) 300 MG capsule Take 300 mg by mouth 3 (three) times daily.   meloxicam  (MOBIC ) 15 MG tablet Take 15 mg by mouth daily.   methocarbamol (ROBAXIN) 750 MG tablet Take 750 mg by mouth 3 (three) times daily.   predniSONE (DELTASONE) 10 MG tablet Take 1 dose pk by oral route as directed for 6 days.   [DISCONTINUED] losartan  (COZAAR ) 100 MG  tablet TAKE 1 TABLET BY MOUTH EVERY DAY   losartan  (COZAAR ) 100 MG tablet Take 1 tablet (100 mg total) by mouth daily.   No facility-administered encounter medications on file as of 11/02/2023.    Surgical History: Past Surgical History:  Procedure Laterality Date   APPENDECTOMY  1963   COLONOSCOPY WITH PROPOFOL  N/A 04/25/2018   Procedure: COLONOSCOPY WITH PROPOFOL ;  Surgeon: Deveron Fly, MD;  Location: [facility] ENDOSCOPY;  Service: Endoscopy;  Laterality: N/A;   EYE SURGERY  2013   HERNIA REPAIR  1986    Medical History: Past Medical History:  Diagnosis Date   Colon, diverticulosis    GERD (gastroesophageal reflux disease)    History of colonic polyps    Internal hemorrhoids    Pneumonia     Family History: Family History  Problem Relation Age of Onset   Cancer Mother    Cancer Father    Cancer Maternal Uncle    Cancer Paternal Uncle    Heart attack Paternal Grandfather     Social History   Socioeconomic History   Marital status: Widowed    Spouse name: Not on file   Number of children: Not on file   Years of education: Not on file   Highest education level: Master's degree (e.g., MA, MS, MEng, MEd, MSW, MBA)  Occupational History   Not on file  Tobacco Use   Smoking status: Former    Current packs/day:  0.00    Types: Cigarettes    Quit date: 07/14/1967    Years since quitting: 56.3   Smokeless tobacco: Never  Vaping Use   Vaping status: Never Used  Substance and Sexual Activity   Alcohol use: Yes    Comment: occasional   Drug use: No   Sexual activity: Not Currently  Other Topics Concern   Not on file  Social History Narrative   Not on file   Social Drivers of Health   Financial Resource Strain: Low Risk  (09/01/2023)   Received from [facility]   Overall Financial Resource Strain (CARDIA)    Difficulty of Paying Living Expenses: Not hard at all  Food Insecurity: No Food Insecurity (09/01/2023)   Received from [facility]   Hunger Vital Sign    Worried About Running Out of Food in the Last Year: Never true    Ran Out of Food in the Last Year: Never true  Transportation Needs: No Transportation Needs (09/01/2023)   Received from [facility] - Transportation    In the past 12 months, has lack of transportation kept you from medical appointments or from getting medications?: No    Lack of Transportation (Non-Medical): No  Physical Activity: Insufficiently Active (11/16/2022)   Exercise Vital Sign    Days of Exercise per Week: 2 days    Minutes of Exercise per Session: 20 min  Stress: No Stress Concern Present (11/16/2022)   [employer] of Occupational Health - Occupational Stress Questionnaire    Feeling of Stress : Not at all  Social Connections: Moderately Integrated (11/16/2022)   Social Connection and Isolation Panel [NHANES]    Frequency of Communication with Friends and Family: Three times a week    Frequency of Social Gatherings with Friends and Family: Once a week    Attends Religious Services: More than 4 times per year    Active Member of [employer] or Organizations: Yes    Attends Banker Meetings: More than 4 times per year    Marital Status: Widowed  Intimate Partner Violence: Not At Risk (12/25/2021)   Humiliation, Afraid, Rape, and Kick questionnaire    Fear of Current or Ex-Partner: No    Emotionally Abused: No    Physically Abused: No    Sexually Abused: No     Review of Systems  Constitutional: Negative.  Negative for chills, fatigue and unexpected weight change.  HENT:  Negative for congestion, postnasal drip, rhinorrhea, sneezing and sore throat.   Eyes:  Negative for redness.  Respiratory: Negative.  Negative for cough, chest tightness, shortness of breath and wheezing.   Cardiovascular: Negative.  Negative for chest pain and palpitations.  Gastrointestinal: Negative.  Negative for abdominal pain, constipation, diarrhea, nausea  and vomiting.  Genitourinary:  Negative for dysuria and frequency.  Musculoskeletal:  Positive for arthralgias and back pain. Negative for joint swelling and neck pain.  Skin:  Negative for rash.  Neurological: Negative.  Negative for tremors and numbness.  Hematological:  Negative for adenopathy. Does not bruise/bleed easily.  Psychiatric/Behavioral:  Negative for behavioral problems (Depression), sleep disturbance and suicidal ideas. The patient is not nervous/anxious.     Vital Signs: BP (!) 150/80   Pulse 85   Temp 98.2 F (36.8 C)   Resp 16   Ht 6' (1.829 m)   Wt 235 lb 12.8 oz (107 kg)   SpO2 93%   BMI 31.98 kg/m  Physical Exam Vitals reviewed.  Constitutional:      General: He is not in acute distress.    Appearance: Normal appearance. He is obese. He is not ill-appearing.  HENT:     Head: Normocephalic and atraumatic.  Eyes:     Pupils: Pupils are equal, round, and reactive to light.  Cardiovascular:     Rate and Rhythm: Normal rate and regular rhythm.  Pulmonary:     Effort: Pulmonary effort is normal. No respiratory distress.  Neurological:     Mental Status: He is alert and oriented to person, place, and time.  Psychiatric:        Mood and Affect: Mood normal.        Behavior: Behavior normal.       Assessment/Plan: 1. Essential hypertension (Primary) Elevated currently, patient ok to restart his losartan  as prescribed. Refills ordered. BP is stable on losartan    2. Chronic midline low back pain without sciatica Seeing [facility]opedic specialist at [facility]. Continue medications as prescribed. --taking gabapentin, methocarbamol and prednisone.   3. Class 1 obesity due to excess calories without serious comorbidity with body mass index (BMI) of 31.0 to 31.9 in adult Noted. Patient eats a fairly healthy diet and walks for exercise, encouraged to continue diet and exercise as discussed.     General Counseling: Negan verbalizes understanding of the  findings of todays visit and agrees with plan of treatment. I have discussed any further diagnostic evaluation that may be needed or ordered today. We also reviewed his medications today. he has been encouraged to call the office with any questions or concerns that should arise related to todays visit.    No orders of the defined types were placed in this encounter.   Meds ordered this encounter  Medications   losartan  (COZAAR ) 100 MG tablet    Sig: Take 1 tablet (100 mg total) by mouth daily.    Dispense:  90 tablet    Refill:  3    Discontinue all previous orders for losartan  and keep new script on file for future refills.    Return in about 7 months (around 06/03/2024) for Burlene Carpen PCP in november anytime after 05/31/2024.  Time spent:30 Minutes Time spent with patient included reviewing progress notes, labs, imaging studies, and discussing plan for follow up.   [place] Controlled Substance Database was reviewed by me for overdose risk score (ORS)   This patient was seen by Laurence Pons, FNP-C in collaboration with Dr. Verneta Gone as a part of collaborative care agreement.   Levoy Geisen R. Bobbi Burow, MSN, FNP-C Internal Medicine

## 2023-11-05 ENCOUNTER — Encounter: Payer: Self-pay | Admitting: *Deleted

## 2023-11-15 ENCOUNTER — Encounter: Payer: Self-pay | Admitting: *Deleted

## 2023-11-16 ENCOUNTER — Ambulatory Visit: Admitting: Anesthesiology

## 2023-11-16 ENCOUNTER — Other Ambulatory Visit: Payer: Self-pay

## 2023-11-16 ENCOUNTER — Encounter
Admission: RE | Disposition: A | Discharge status: Home / Self Care | Payer: Self-pay | Source: Home / Self Care | Attending: Gastroenterology

## 2023-11-16 ENCOUNTER — Encounter: Payer: Self-pay | Admitting: *Deleted

## 2023-11-16 ENCOUNTER — Ambulatory Visit
Admission: RE | Admit: 2023-11-16 | Discharge: 2023-11-16 | Disposition: A | Discharge status: Home / Self Care | Payer: Medicare Other | Attending: Gastroenterology | Admitting: Gastroenterology

## 2023-11-16 DIAGNOSIS — K640 First degree hemorrhoids: Secondary | ICD-10-CM | POA: Diagnosis not present

## 2023-11-16 DIAGNOSIS — D123 Benign neoplasm of transverse colon: Secondary | ICD-10-CM | POA: Diagnosis not present

## 2023-11-16 DIAGNOSIS — I10 Essential (primary) hypertension: Secondary | ICD-10-CM | POA: Insufficient documentation

## 2023-11-16 DIAGNOSIS — Z1211 Encounter for screening for malignant neoplasm of colon: Secondary | ICD-10-CM | POA: Diagnosis present

## 2023-11-16 DIAGNOSIS — Z9049 Acquired absence of other specified parts of digestive tract: Secondary | ICD-10-CM | POA: Diagnosis not present

## 2023-11-16 DIAGNOSIS — K5730 Diverticulosis of large intestine without perforation or abscess without bleeding: Secondary | ICD-10-CM | POA: Insufficient documentation

## 2023-11-16 HISTORY — DX: Palmar fascial fibromatosis (dupuytren): M72.0

## 2023-11-16 HISTORY — DX: Asymptomatic varicose veins of bilateral lower extremities: I83.93

## 2023-11-16 HISTORY — DX: Other hammer toe(s) (acquired), unspecified foot: M20.40

## 2023-11-16 HISTORY — DX: Venous insufficiency (chronic) (peripheral): I87.2

## 2023-11-16 HISTORY — DX: Essential (primary) hypertension: I10

## 2023-11-16 HISTORY — PX: POLYPECTOMY: SHX149

## 2023-11-16 HISTORY — PX: COLONOSCOPY WITH PROPOFOL: SHX5780

## 2023-11-16 HISTORY — DX: Hyperlipidemia, unspecified: E78.5

## 2023-11-16 HISTORY — DX: Phlebitis and thrombophlebitis of unspecified site: I80.9

## 2023-11-16 HISTORY — DX: Zoster without complications: B02.9

## 2023-11-16 SURGERY — COLONOSCOPY WITH PROPOFOL
Anesthesia: General

## 2023-11-16 MED ORDER — SODIUM CHLORIDE 0.9 % IV SOLN: INTRAVENOUS | Status: DC

## 2023-11-16 MED ORDER — LIDOCAINE HCL (PF) 2 % IJ SOLN
INTRAMUSCULAR | Status: AC
  Filled 2023-11-16: qty 10

## 2023-11-16 MED ORDER — PROPOFOL 1000 MG/100ML IV EMUL
INTRAVENOUS | Status: AC
  Filled 2023-11-16: qty 100

## 2023-11-16 MED ORDER — PROPOFOL 500 MG/50ML IV EMUL
INTRAVENOUS | Status: DC | PRN
  Administered 2023-11-16: 50 ug/kg/min via INTRAVENOUS

## 2023-11-16 MED ORDER — DEXMEDETOMIDINE HCL IN NACL 80 MCG/20ML IV SOLN
INTRAVENOUS | Status: DC | PRN
  Administered 2023-11-16: 20 ug via INTRAVENOUS

## 2023-11-16 MED ORDER — LIDOCAINE HCL (CARDIAC) PF 100 MG/5ML IV SOSY
PREFILLED_SYRINGE | INTRAVENOUS | Status: DC | PRN
  Administered 2023-11-16: 80 mg via INTRAVENOUS

## 2023-11-16 MED ORDER — PROPOFOL 10 MG/ML IV BOLUS
INTRAVENOUS | Status: DC | PRN
  Administered 2023-11-16: 30 mg via INTRAVENOUS
  Administered 2023-11-16: 40 mg via INTRAVENOUS

## 2023-11-16 NOTE — Op Note (Signed)
 [facility] Patient Name: Paul Buckley Procedure Date: 11/16/2023 10:41 AM MRN: [id number] Account #: [id number] Date of Birth: 11-18-1945 Admit Type: Outpatient Age: 78 Room: [facility] ENDO ROOM 1 Gender: Male Note Status: Finalized Instrument Name: Hyman Main [id number] Procedure:             Colonoscopy Indications:           High risk colon cancer surveillance: Personal history                         of colonic polyps, Last colonoscopy 5 years ago Providers:             Leida Puna MD, MD Medicines:             Monitored Anesthesia Care Complications:         No immediate complications. Estimated blood loss:                         Minimal. Procedure:             Pre-Anesthesia Assessment:                        - Prior to the procedure, a History and Physical was                         performed, and patient medications and allergies were                         reviewed. The patient is competent. The risks and                         benefits of the procedure and the sedation options and                         risks were discussed with the patient. All questions                         were answered and informed consent was obtained.                         Patient identification and proposed procedure were                         verified by the physician, the nurse, the                         anesthesiologist, the anesthetist and the technician                         in the endoscopy suite. Mental Status Examination:                         alert and oriented. Airway Examination: normal                         oropharyngeal airway and neck mobility. Respiratory                         Examination: clear to auscultation. CV Examination:  normal. Prophylactic Antibiotics: The patient does not                         require prophylactic antibiotics. Prior                         Anticoagulants: The patient has taken no  anticoagulant                         or antiplatelet agents. ASA Grade Assessment: II - A                         patient with mild systemic disease. After reviewing                         the risks and benefits, the patient was deemed in                         satisfactory condition to undergo the procedure. The                         anesthesia plan was to use monitored anesthesia care                         (MAC). Immediately prior to administration of                         medications, the patient was re-assessed for adequacy                         to receive sedatives. The heart rate, respiratory                         rate, oxygen saturations, blood pressure, adequacy of                         pulmonary ventilation, and response to care were                         monitored throughout the procedure. The physical                         status of the patient was re-assessed after the                         procedure.                        After obtaining informed consent, the colonoscope was                         passed under direct vision. Throughout the procedure,                         the patient's blood pressure, pulse, and oxygen                         saturations were monitored continuously. The  Colonoscope was introduced through the anus and                         advanced to the the cecum, identified by appendiceal                         orifice and ileocecal valve. The colonoscopy was                         performed without difficulty. The patient tolerated                         the procedure well. The quality of the bowel                         preparation was good. The ileocecal valve, appendiceal                         orifice, and rectum were photographed. Findings:      The perianal and digital rectal examinations were normal.      A 4 mm polyp was found in the proximal transverse colon. The polyp was       sessile. The  polyp was removed with a cold snare. Resection and       retrieval were complete. Estimated blood loss was minimal.      Scattered small-mouthed diverticula were found in the sigmoid colon.      Internal hemorrhoids were found during retroflexion. The hemorrhoids       were Grade I (internal hemorrhoids that do not prolapse).      The exam was otherwise without abnormality on direct and retroflexion       views. Impression:            - One 4 mm polyp in the proximal transverse colon,                         removed with a cold snare. Resected and retrieved.                        - Diverticulosis in the sigmoid colon.                        - Internal hemorrhoids.                        - The examination was otherwise normal on direct and                         retroflexion views. Recommendation:        - Discharge patient to home.                        - Resume previous diet.                        - Continue present medications.                        - Await pathology results.                        -  Repeat colonoscopy is not recommended due to current                         age (46 years or older) for surveillance.                        - Return to referring physician as previously                         scheduled. Procedure Code(s):     --- Professional ---                        [phone], Colonoscopy, flexible; with removal of                         tumor(s), polyp(s), or other lesion(s) by snare                         technique Diagnosis Code(s):     --- Professional ---                        Z86.010, Personal history of colonic polyps                        D12.3, Benign neoplasm of transverse colon (hepatic                         flexure or splenic flexure)                        K64.0, First degree hemorrhoids                        K57.30, Diverticulosis of large intestine without                         perforation or abscess without bleeding CPT copyright 2022  American Medical Association. All rights reserved. The codes documented in this report are preliminary and upon coder review may  be revised to meet current compliance requirements. Leida Puna MD, MD 11/16/2023 11:19:36 AM Number of Addenda: 0 Note Initiated On: 11/16/2023 10:41 AM Scope Withdrawal Time: 0 hours 8 minutes 50 seconds  Total Procedure Duration: 0 hours 13 minutes 16 seconds  Estimated Blood Loss:  Estimated blood loss was minimal.      [facility]

## 2023-11-16 NOTE — Interval H&P Note (Signed)
 History and Physical Interval Note:  11/16/2023 10:52 AM  Paul Bern.  has presented today for surgery, with the diagnosis of HX OF POLYP COLON.  The various methods of treatment have been discussed with the patient and family. After consideration of risks, benefits and other options for treatment, the patient has consented to  Procedure(s): COLONOSCOPY WITH PROPOFOL  (N/A) as a surgical intervention.  The patient's history has been reviewed, patient examined, no change in status, stable for surgery.  I have reviewed the patient's chart and labs.  Questions were answered to the patient's satisfaction.     [name]  Ok to proceed with colonoscopy

## 2023-11-16 NOTE — Transfer of Care (Signed)
 Immediate Anesthesia Transfer of Care Note  Patient: Paul Buckley.  Procedure(s) Performed: COLONOSCOPY WITH PROPOFOL  POLYPECTOMY, INTESTINE  Patient Location: PACU  Anesthesia Type:General  Level of Consciousness: awake, alert , and oriented  Airway & Oxygen Therapy: Patient Spontanous Breathing  Post-op Assessment: Report given to RN and Post -op Vital signs reviewed and stable  Post vital signs: Reviewed and stable  Last Vitals:  Vitals Value Taken Time  BP 135/75 11/16/23 1119  Temp 36.1 C 11/16/23 1118  Pulse 85 11/16/23 1120  Resp 18 11/16/23 1119  SpO2 95 % 11/16/23 1120  Vitals shown include unfiled device data.  Last Pain:  Vitals:   11/16/23 1118  TempSrc: Temporal  PainSc: Asleep         Complications: No notable events documented.

## 2023-11-16 NOTE — Anesthesia Postprocedure Evaluation (Signed)
 Anesthesia Post Note  Patient: Paul J Manalang Jr.  Procedure(s) Performed: COLONOSCOPY WITH PROPOFOL  POLYPECTOMY, INTESTINE  Patient location during evaluation: Endoscopy Anesthesia Type: General Level of consciousness: awake and alert Pain management: pain level controlled Vital Signs Assessment: post-procedure vital signs reviewed and stable Respiratory status: spontaneous breathing, nonlabored ventilation, respiratory function stable and patient connected to nasal cannula oxygen Cardiovascular status: blood pressure returned to baseline and stable Postop Assessment: no apparent nausea or vomiting Anesthetic complications: no   No notable events documented.   Last Vitals:  Vitals:   11/16/23 1050 11/16/23 1118  BP: (!) 179/94 135/75  Pulse: 100   Resp: 20 19  Temp: (!) 36.2 C (!) 36.1 C  SpO2: 95%     Last Pain:  Vitals:   11/16/23 1128  TempSrc:   PainSc: 0-No pain                 Paul Buckley

## 2023-11-16 NOTE — H&P (Signed)
 Outpatient short stay form Pre-procedure 11/16/2023  [name], MD  Primary Physician: System, Provider Not In  Reason for visit:  Surveillance  History of present illness:    78 y/o gentleman with history of hypertension here for surveillance colonoscopy. Last colonoscopy in 2019 was unremarkable but he had a large TA in 2016. No blood thinners. No family history of GI malignancies. History of appendectomy and hernia repair.    Current Facility-Administered Medications:    0.9 %  sodium chloride  infusion, , Intravenous, Continuous, Aidel Davisson, Leanora Prophet, MD  Medications Prior to Admission  Medication Sig Dispense Refill Last Dose/Taking   aspirin EC 81 MG tablet Take 81 mg by mouth daily. Swallow whole.   Past Month   gabapentin (NEURONTIN) 300 MG capsule Take 300 mg by mouth 3 (three) times daily.      losartan  (COZAAR ) 100 MG tablet Take 1 tablet (100 mg total) by mouth daily. 90 tablet 3    meloxicam  (MOBIC ) 15 MG tablet Take 15 mg by mouth daily.      methocarbamol (ROBAXIN) 750 MG tablet Take 750 mg by mouth 3 (three) times daily.      predniSONE (DELTASONE) 10 MG tablet Take 1 dose pk by oral route as directed for 6 days.        No Known Allergies   Past Medical History:  Diagnosis Date   Chronic venous insufficiency    Colon, diverticulosis    Dupuytren's disease of palm    Dyslipidemia    GERD (gastroesophageal reflux disease)    Hammer toe    Herpes zoster without complication    History of colonic polyps    Hypertension    Internal hemorrhoids    Pneumonia    Superficial thrombophlebitis    Varicose veins of both lower extremities, unspecified whether complicated     Review of systems:  Otherwise negative.    Physical Exam  Gen: Alert, oriented. Appears stated age.  HEENT: PERRLA. Lungs: No respiratory distress CV: RRR Abd: soft, benign, no masses Ext: No edema    Planned procedures: Proceed with colonoscopy. The patient understands the  nature of the planned procedure, indications, risks, alternatives and potential complications including but not limited to bleeding, infection, perforation, damage to internal organs and possible oversedation/side effects from anesthesia. The patient agrees and gives consent to proceed.  Please refer to procedure notes for findings, recommendations and patient disposition/instructions.     [name], MD [facility]

## 2023-11-16 NOTE — Anesthesia Preprocedure Evaluation (Signed)
 Anesthesia Evaluation  Patient identified by MRN, date of birth, ID band Patient awake    Reviewed: Allergy & Precautions, NPO status , Patient's Chart, lab work & pertinent test results  History of Anesthesia Complications Negative for: history of anesthetic complications  Airway Mallampati: III  TM Distance: <3 FB Neck ROM: full    Dental  (+) Chipped   Pulmonary neg pulmonary ROS, neg shortness of breath, former smoker   Pulmonary exam normal        Cardiovascular Exercise Tolerance: Good hypertension, (-) angina negative cardio ROS Normal cardiovascular exam     Neuro/Psych negative neurological ROS  negative psych ROS   GI/Hepatic Neg liver ROS,GERD  Controlled,,  Endo/Other  negative endocrine ROS    Renal/GU negative Renal ROS  negative genitourinary   Musculoskeletal   Abdominal   Peds  Hematology negative hematology ROS (+)   Anesthesia Other Findings Past Medical History: No date: Chronic venous insufficiency No date: Colon, diverticulosis No date: Dupuytren's disease of palm No date: Dyslipidemia No date: GERD (gastroesophageal reflux disease) No date: Hammer toe No date: Herpes zoster without complication No date: History of colonic polyps No date: Hypertension No date: Internal hemorrhoids No date: Pneumonia No date: Superficial thrombophlebitis No date: Varicose veins of both lower extremities, unspecified  whether complicated  Past Surgical History: 1963: APPENDECTOMY 04/25/2018: COLONOSCOPY WITH PROPOFOL ; N/A     Comment:  Procedure: COLONOSCOPY WITH PROPOFOL ;  Surgeon:               Deveron Fly, MD;  Location: [facility] ENDOSCOPY;                Service: Endoscopy;  Laterality: N/A; 2013: EYE SURGERY 1986: HERNIA REPAIR  BMI    Body Mass Index: 31.19 kg/m      Reproductive/Obstetrics negative OB ROS                             Anesthesia  Physical Anesthesia Plan  ASA: 3  Anesthesia Plan: General   Post-op Pain Management:    Induction: Intravenous  PONV Risk Score and Plan: Propofol  infusion and TIVA  Airway Management Planned: Natural Airway and Nasal Cannula  Additional Equipment:   Intra-op Plan:   Post-operative Plan:   Informed Consent: I have reviewed the patients History and Physical, chart, labs and discussed the procedure including the risks, benefits and alternatives for the proposed anesthesia with the patient or authorized representative who has indicated his/her understanding and acceptance.     Dental Advisory Given  Plan Discussed with: Anesthesiologist, CRNA and Surgeon  Anesthesia Plan Comments: (Patient consented for risks of anesthesia including but not limited to:  - adverse reactions to medications - risk of airway placement if required - damage to eyes, teeth, lips or other oral mucosa - nerve damage due to positioning  - sore throat or hoarseness - Damage to heart, brain, nerves, lungs, other parts of body or loss of life  Patient voiced understanding and assent.)       Anesthesia Quick Evaluation

## 2023-11-18 LAB — SURGICAL PATHOLOGY

## 2024-01-20 ENCOUNTER — Encounter: Payer: Self-pay | Admitting: Podiatry

## 2024-01-20 ENCOUNTER — Ambulatory Visit (INDEPENDENT_AMBULATORY_CARE_PROVIDER_SITE_OTHER): Admitting: Podiatry

## 2024-01-20 VITALS — Ht 72.0 in | Wt 230.0 lb

## 2024-01-20 DIAGNOSIS — M79674 Pain in right toe(s): Secondary | ICD-10-CM

## 2024-01-20 DIAGNOSIS — M79675 Pain in left toe(s): Secondary | ICD-10-CM

## 2024-01-20 DIAGNOSIS — B351 Tinea unguium: Secondary | ICD-10-CM | POA: Diagnosis not present

## 2024-01-20 NOTE — Progress Notes (Signed)
  Subjective:  Patient ID: Paul JINNY Jason Mickey., male    DOB: Nov 02, 1945,  MRN: [id number]  78 y.o. male presents painful thick toenails that are difficult to trim. Pain interferes with ambulation. Aggravating factors include wearing enclosed shoe gear. Pain is relieved with periodic professional debridement. He is undergoing therapy for back pain. States he has some numbness in toes of right foot due to nerve impingement. Chief Complaint  Patient presents with   Nail Problem    Pt is here for [facility] PCP is Dr Britta and LOV was a few months ago.   New problem(s): None   PCP is Britta King, MD.  No Known Allergies  Review of Systems: Negative except as noted in the HPI.   Objective:  Paul JINNY Jason Mickey. is a pleasant 78 y.o. male WD, WN in NAD. AAO x 3.  Vascular Examination: Vascular status intact b/l with palpable pedal pulses. CFT immediate b/l. Pedal hair present. No edema. No pain with calf compression b/l. Skin temperature gradient WNL b/l. No varicosities noted. No cyanosis or clubbing noted.  Neurological Examination: Sensation grossly intact b/l with 10 gram monofilament. Vibratory sensation intact b/l.  Dermatological Examination: Pedal skin with normal turgor, texture and tone b/l. No open wounds nor interdigital macerations noted. Toenails 1-5 b/l thick, discolored, elongated with subungual debris and pain on dorsal palpation. No hyperkeratotic lesions noted b/l.   Musculoskeletal Examination: Muscle strength 5/5 to b/l LE.  No pain, crepitus noted b/l. 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 Patient ambulates independently without assistive aids.   Radiographs: None  Last A1c:       No data to display           Assessment:   1. Pain due to onychomycosis of toenails of both feet    Plan:  Patient was evaluated and treated. All patient's and/or POA's questions/concerns addressed on today's visit. Toenails 1-5 debrided in length and girth without incident. Continue soft,  supportive shoe gear daily. Report any pedal injuries to medical professional. Call office if there are any questions/concerns. -Patient/POA to call should there be question/concern in the interim.  Return in about 3 months (around 04/21/2024).  Paul Buckley, DPM      [facility] LOCATION: [address], [place] [address]                   Office [phone]   [facility] LOCATION: [address] [place], [place] [address] Office [phone]

## 2024-04-21 ENCOUNTER — Encounter: Payer: Self-pay | Admitting: Podiatry

## 2024-04-21 ENCOUNTER — Ambulatory Visit: Admitting: Podiatry

## 2024-04-21 DIAGNOSIS — M79675 Pain in left toe(s): Secondary | ICD-10-CM | POA: Diagnosis not present

## 2024-04-21 DIAGNOSIS — B351 Tinea unguium: Secondary | ICD-10-CM | POA: Diagnosis not present

## 2024-04-21 DIAGNOSIS — M79674 Pain in right toe(s): Secondary | ICD-10-CM

## 2024-04-29 NOTE — Progress Notes (Signed)
  Subjective:  Patient ID: Paul Buckley., male    DOB: [birth year],  MRN: [id number]  78 y.o. male presents painful elongated mycotic toenails 1-5 bilaterally which are tender when wearing enclosed shoe gear. Pain is relieved with periodic professional debridement.  Chief Complaint  Patient presents with   Toe Pain    RFC.  Dr. CANDIE Bathe  is his PCP as Dr. Britta has retired. Denies being diabetic    New problem(s): None   PCP is Britta King, MD.  No Known Allergies  Review of Systems: Negative except as noted in the HPI.   Objective:  Paul Buckley. is a pleasant 78 y.o. male WD, WN in NAD. AAO x 3.  Vascular Examination: Vascular status intact b/l with palpable pedal pulses. CFT immediate b/l. Pedal hair present. No edema. No pain with calf compression b/l. Skin temperature gradient WNL b/l. No varicosities noted. No cyanosis or clubbing noted.  Neurological Examination: Sensation grossly intact b/l with 10 gram monofilament. Vibratory sensation intact b/l.  Dermatological Examination: Pedal skin with normal turgor, texture and tone b/l. No open wounds nor interdigital macerations noted. Toenails 1-5 b/l thick, discolored, elongated with subungual debris and pain on dorsal palpation. No hyperkeratotic lesions noted b/l.   Musculoskeletal Examination: Muscle strength 5/5 to b/l LE.  No pain, crepitus noted b/l. 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 Patient ambulates independently without assistive aids.   Radiographs: None Assessment:   1. Pain due to onychomycosis of toenails of both feet    Plan:  Consent given for treatment. Patient examined. All patient's and/or POA's questions/concerns addressed on today's visit. Mycotic toenails 1-5 debrided in length and girth without incident. Continue soft, supportive shoe gear daily. Report any pedal injuries to medical professional. Call office if there are any quesitons/concerns. -Patient/POA to call should there be question/concern in  the interim.  Return in about 3 months (around 07/22/2024).  Delon LITTIE Merlin, DPM      [place] LOCATION: [address] [place] [address]                   Office [phone]   [facility] LOCATION: [address] [place], [place] [address] Office [phone]

## 2024-05-15 ENCOUNTER — Telehealth: Payer: Self-pay | Admitting: Nurse Practitioner

## 2024-05-15 DIAGNOSIS — Z125 Encounter for screening for malignant neoplasm of prostate: Secondary | ICD-10-CM

## 2024-05-15 DIAGNOSIS — E785 Hyperlipidemia, unspecified: Secondary | ICD-10-CM

## 2024-05-15 DIAGNOSIS — R972 Elevated prostate specific antigen [PSA]: Secondary | ICD-10-CM

## 2024-05-15 DIAGNOSIS — E66811 Obesity, class 1: Secondary | ICD-10-CM

## 2024-05-15 DIAGNOSIS — I10 Essential (primary) hypertension: Secondary | ICD-10-CM

## 2024-05-15 NOTE — Telephone Encounter (Signed)
 Patient notified

## 2024-06-05 ENCOUNTER — Encounter: Payer: Self-pay | Admitting: Nurse Practitioner

## 2024-06-05 ENCOUNTER — Ambulatory Visit (INDEPENDENT_AMBULATORY_CARE_PROVIDER_SITE_OTHER): Admitting: Nurse Practitioner

## 2024-06-05 VITALS — BP 135/70 | HR 63 | Temp 98.1°F | Resp 16 | Ht 72.0 in | Wt 236.6 lb

## 2024-06-05 DIAGNOSIS — Z6832 Body mass index (BMI) 32.0-32.9, adult: Secondary | ICD-10-CM

## 2024-06-05 DIAGNOSIS — M545 Low back pain, unspecified: Secondary | ICD-10-CM | POA: Diagnosis not present

## 2024-06-05 DIAGNOSIS — R972 Elevated prostate specific antigen [PSA]: Secondary | ICD-10-CM | POA: Diagnosis not present

## 2024-06-05 DIAGNOSIS — G8929 Other chronic pain: Secondary | ICD-10-CM

## 2024-06-05 DIAGNOSIS — Z0001 Encounter for general adult medical examination with abnormal findings: Secondary | ICD-10-CM | POA: Diagnosis not present

## 2024-06-05 DIAGNOSIS — E66811 Obesity, class 1: Secondary | ICD-10-CM

## 2024-06-05 DIAGNOSIS — I10 Essential (primary) hypertension: Secondary | ICD-10-CM | POA: Diagnosis not present

## 2024-06-05 DIAGNOSIS — E6609 Other obesity due to excess calories: Secondary | ICD-10-CM

## 2024-06-05 NOTE — Progress Notes (Signed)
 [facility] [address] [place], [place] [address]  Internal MEDICINE  Office Visit Note  Patient Name: Paul Buckley  [id number]  [id number]  Date of Service: 06/05/2024  Chief Complaint  Patient presents with   Gastroesophageal Reflux   Hypertension   Medicare Wellness    HPI Paul Buckley presents for an annual well visit and physical exam.  Well-appearing 78 y.o. male with chronic back pain, foot pain and hypertension. History of elevated PSA  Routine CRC screening: discontinued, aged out.  Labs: due for labs now.  New or worsening pain: none, chronic foot pain, seeing orthopedic specialist for this.  Received covid booster in September with his seasonal flu vaccine.      06/05/2024   11:26 AM 12/25/2021   12:20 PM  MMSE - Mini Mental State Exam  Not completed:  Unable to complete  Orientation to time 5   Orientation to Place 5   Registration 3   Attention/ Calculation 5   Recall 3   Language- name 2 objects 2   Language- repeat 1   Language- follow 3 step command 3   Language- read & follow direction 1   Write a sentence 1   Copy design 1   Total score 30     Functional Status Survey: Is the patient deaf or have difficulty hearing?: No Does the patient have difficulty seeing, even when wearing glasses/contacts?: No Does the patient have difficulty concentrating, remembering, or making decisions?: No Does the patient have difficulty walking or climbing stairs?: No Does the patient have difficulty dressing or bathing?: No Does the patient have difficulty doing errands alone such as visiting a doctor's office or shopping?: No     05/06/2021    8:47 AM 11/04/2021   10:14 AM 12/25/2021   12:04 PM 11/02/2023    9:00 AM 06/05/2024   11:25 AM  Fall Risk  Falls in the past year?  0 0 0 0  Was there an injury with Fall? 0 0 0 0 0  Fall Risk Category Calculator  0 0 0 0  Fall Risk Category (Retired)  Low  Low     (RETIRED) Patient Fall Risk Level Low  fall risk  Low fall risk  Low fall risk     Patient at Risk for Falls Due to No Fall Risks No Fall Risks No Fall Risks No Fall Risks   Fall risk Follow up Falls evaluation completed  Falls evaluation completed  Falls evaluation completed  Falls evaluation completed Falls evaluation completed     Data saved with a previous flowsheet row definition       06/05/2024   11:25 AM  Depression screen PHQ 2/9  Decreased Interest 0  Down, Depressed, Hopeless 0  PHQ - 2 Score 0       Current Medication: Outpatient Encounter Medications as of 06/05/2024  Medication Sig   aspirin EC 81 MG tablet Take 81 mg by mouth daily. Swallow whole.   losartan  (COZAAR ) 100 MG tablet Take 1 tablet (100 mg total) by mouth daily.   meloxicam  (MOBIC ) 15 MG tablet Take 15 mg by mouth daily.   No facility-administered encounter medications on file as of 06/05/2024.    Surgical History: Past Surgical History:  Procedure Laterality Date   APPENDECTOMY  1963   COLONOSCOPY WITH PROPOFOL  N/A 04/25/2018   Procedure: COLONOSCOPY WITH PROPOFOL ;  Surgeon: Gaylyn Gladis PENNER, MD;  Location: [facility] ENDOSCOPY;  Service: Endoscopy;  Laterality: N/A;   COLONOSCOPY WITH  PROPOFOL  N/A 11/16/2023   Procedure: COLONOSCOPY WITH PROPOFOL ;  Surgeon: Maryruth Ole DASEN, MD;  Location: [facility] ENDOSCOPY;  Service: Endoscopy;  Laterality: N/A;   EYE SURGERY  2013   HERNIA REPAIR  1986   POLYPECTOMY  11/16/2023   Procedure: POLYPECTOMY, INTESTINE;  Surgeon: Maryruth Ole DASEN, MD;  Location: [facility] ENDOSCOPY;  Service: Endoscopy;;    Medical History: Past Medical History:  Diagnosis Date   Chronic venous insufficiency    Colon, diverticulosis    Dupuytren's disease of palm    Dyslipidemia    GERD (gastroesophageal reflux disease)    Hammer toe    Herpes zoster without complication    History of colonic polyps    Hypertension    Internal hemorrhoids    Pneumonia    Superficial thrombophlebitis    Varicose veins of both lower  extremities, unspecified whether complicated     Family History: Family History  Problem Relation Age of Onset   Cancer Mother    Cancer Father    Cancer Maternal Uncle    Cancer Paternal Uncle    Heart attack Paternal Grandfather     Social History   Socioeconomic History   Marital status: Widowed    Spouse name: Not on file   Number of children: Not on file   Years of education: Not on file   Highest education level: Master's degree (e.g., MA, MS, MEng, MEd, MSW, MBA)  Occupational History   Not on file  Tobacco Use   Smoking status: Former    Current packs/day: 0.00    Types: Cigarettes    Quit date: 07/14/1967    Years since quitting: 56.9   Smokeless tobacco: Never  Vaping Use   Vaping status: Never Used  Substance and Sexual Activity   Alcohol use: Yes    Comment: social   Drug use: No   Sexual activity: Not Currently  Other Topics Concern   Not on file  Social History Narrative   Not on file   Social Drivers of Health   Financial Resource Strain: Low Risk  (09/01/2023)   Received from [facility]   Overall Financial Resource Strain (CARDIA)    Difficulty of Paying Living Expenses: Not hard at all  Food Insecurity: No Food Insecurity (09/01/2023)   Received from [facility]   Hunger Vital Sign    Within the past 12 months, you worried that your food would run out before you got the money to buy more.: Never true    Within the past 12 months, the food you bought just didn't last and you didn't have money to get more.: Never true  Transportation Needs: No Transportation Needs (09/01/2023)   Received from [facility] - Transportation    In the past 12 months, has lack of transportation kept you from medical appointments or from getting medications?: No    Lack of Transportation (Non-Medical): No  Physical Activity: Insufficiently Active (11/16/2022)   Exercise Vital Sign    Days of Exercise per  Week: 2 days    Minutes of Exercise per Session: 20 min  Stress: No Stress Concern Present (11/16/2022)   [employer] of Occupational Health - Occupational Stress Questionnaire    Feeling of Stress : Not at all  Social Connections: Moderately Integrated (11/16/2022)   Social Connection and Isolation Panel    Frequency of Communication with Friends and Family: Three times a week    Frequency of Social Gatherings with  Friends and Family: Once a week    Attends Religious Services: More than 4 times per year    Active Member of Clubs or Organizations: Yes    Attends Banker Meetings: More than 4 times per year    Marital Status: Widowed  Intimate Partner Violence: Not At Risk (12/25/2021)   Humiliation, Afraid, Rape, and Kick questionnaire    Fear of Current or Ex-Partner: No    Emotionally Abused: No    Physically Abused: No    Sexually Abused: No      Review of Systems  Constitutional: Negative.  Negative for activity change, appetite change, chills, fatigue, fever and unexpected weight change.  HENT: Negative.  Negative for congestion, ear pain, postnasal drip, rhinorrhea, sneezing, sore throat and trouble swallowing.   Eyes: Negative.  Negative for redness.  Respiratory: Negative.  Negative for cough, chest tightness, shortness of breath and wheezing.   Cardiovascular: Negative.  Negative for chest pain and palpitations.  Gastrointestinal: Negative.  Negative for abdominal pain, blood in stool, constipation, diarrhea, nausea and vomiting.  Endocrine: Negative.   Genitourinary: Negative.  Negative for difficulty urinating, dysuria, frequency, hematuria and urgency.  Musculoskeletal:  Positive for arthralgias and back pain. Negative for joint swelling, myalgias and neck pain.  Skin: Negative.  Negative for rash and wound.  Allergic/Immunologic: Negative.  Negative for immunocompromised state.  Neurological: Negative.  Negative for dizziness, tremors, seizures, numbness  and headaches.  Hematological: Negative.  Negative for adenopathy. Does not bruise/bleed easily.  Psychiatric/Behavioral: Negative.  Negative for behavioral problems (Depression), self-injury, sleep disturbance and suicidal ideas. The patient is not nervous/anxious.     Vital Signs: BP 135/70 Comment: 146/84  Pulse 63   Temp 98.1 F (36.7 C)   Resp 16   Ht 6' (1.829 m)   Wt 236 lb 9.6 oz (107.3 kg)   SpO2 96%   BMI 32.09 kg/m    Physical Exam Vitals reviewed.  Constitutional:      General: He is not in acute distress.    Appearance: Normal appearance. He is obese. He is not ill-appearing.  HENT:     Head: Normocephalic and atraumatic.     Right Ear: Tympanic membrane, ear canal and external ear normal.     Left Ear: Tympanic membrane, ear canal and external ear normal.     Nose: Nose normal. No congestion or rhinorrhea.     Mouth/Throat:     Mouth: Mucous membranes are moist.     Pharynx: Oropharynx is clear. No oropharyngeal exudate or posterior oropharyngeal erythema.  Eyes:     Extraocular Movements: Extraocular movements intact.     Conjunctiva/sclera: Conjunctivae normal.     Pupils: Pupils are equal, round, and reactive to light.  Cardiovascular:     Rate and Rhythm: Normal rate and regular rhythm.     Pulses: Normal pulses.     Heart sounds: Normal heart sounds. No murmur heard. Pulmonary:     Effort: Pulmonary effort is normal. No respiratory distress.     Breath sounds: Normal breath sounds. No wheezing.  Abdominal:     General: Bowel sounds are normal. There is no distension.     Palpations: Abdomen is soft.     Tenderness: There is no abdominal tenderness. There is no guarding.  Musculoskeletal:        General: Normal range of motion.     Cervical back: Normal range of motion and neck supple.     Right lower leg: No edema.  Left lower leg: No edema.  Lymphadenopathy:     Cervical: No cervical adenopathy.  Skin:    General: Skin is warm and dry.      Capillary Refill: Capillary refill takes less than 2 seconds.  Neurological:     Mental Status: He is alert and oriented to person, place, and time.     Cranial Nerves: No cranial nerve deficit.     Coordination: Coordination normal.     Gait: Gait normal.  Psychiatric:        Mood and Affect: Mood normal.        Behavior: Behavior normal.        Thought Content: Thought content normal.        Judgment: Judgment normal.        Assessment/Plan: 1. Encounter for Medicare annual examination with abnormal findings (Primary) Age-appropriate preventive screenings and vaccinations discussed, annual physical exam completed. Routine labs for health maintenance previously ordered and patient instructed to have labs drawn and follow up in 1-2 weeks to discuss the results. PHM updated.    2. Essential hypertension Stable, continue losartan  as prescribed.   3. Chronic midline low back pain without sciatica Continue follow up with orthopedic specialist.   4. Elevated PSA Reminded patient to have his labs drawn and will follow up in 1-2 weeks   5. Class 1 obesity due to excess calories without serious comorbidity with body mass index (BMI) of 32.0 to 32.9 in adult Noted, smaller portion sizes, low carb options and increasing physical activity as tolerated is recommended to aid in weight loss.     General Counseling: Hung verbalizes understanding of the findings of todays visit and agrees with plan of treatment. I have discussed any further diagnostic evaluation that may be needed or ordered today. We also reviewed his medications today. he has been encouraged to call the office with any questions or concerns that should arise related to todays visit.    No orders of the defined types were placed in this encounter.   No orders of the defined types were placed in this encounter.   Return in about 1 week (around 06/12/2024) for F/U, Diamond Jentz PCP.   Total time spent:30 Minutes Time spent  includes review of chart, medications, test results, and follow up plan with the patient.   [place] Controlled Substance Database was reviewed by me.  This patient was seen by Mardy Maxin, FNP-C in collaboration with Dr. Sigrid Bathe as a part of collaborative care agreement.  Aleeya Veitch R. Maxin, MSN, FNP-C Internal medicine

## 2024-06-08 LAB — CMP14+EGFR
ALT: 14 IU/L (ref 0–44)
AST: 21 IU/L (ref 0–40)
Albumin: 4 g/dL (ref 3.8–4.8)
Alkaline Phosphatase: 71 IU/L (ref 47–123)
BUN/Creatinine Ratio: 17 (ref 10–24)
BUN: 16 mg/dL (ref 8–27)
Bilirubin Total: 0.5 mg/dL (ref 0.0–1.2)
CO2: 23 mmol/L (ref 20–29)
Calcium: 9.3 mg/dL (ref 8.6–10.2)
Chloride: 103 mmol/L (ref 96–106)
Creatinine, Ser: 0.94 mg/dL (ref 0.76–1.27)
Globulin, Total: 2.1 g/dL (ref 1.5–4.5)
Glucose: 89 mg/dL (ref 70–99)
Potassium: 4.3 mmol/L (ref 3.5–5.2)
Sodium: 142 mmol/L (ref 134–144)
Total Protein: 6.1 g/dL (ref 6.0–8.5)
eGFR: 82 mL/min/1.73 (ref >59)

## 2024-06-08 LAB — CBC WITH DIFFERENTIAL/PLATELET
Basophils Absolute: 0 x10E3/uL (ref 0.0–0.2)
Basos: 1 %
EOS (ABSOLUTE): 0.2 x10E3/uL (ref 0.0–0.4)
Eos: 5 %
Hematocrit: 43.3 % (ref 37.5–51.0)
Hemoglobin: 14.7 g/dL (ref 13.0–17.7)
Immature Grans (Abs): 0 x10E3/uL (ref 0.0–0.1)
Immature Granulocytes: 0 %
Lymphocytes Absolute: 1.1 x10E3/uL (ref 0.7–3.1)
Lymphs: 24 %
MCH: 33.4 pg — ABNORMAL HIGH (ref 26.6–33.0)
MCHC: 33.9 g/dL (ref 31.5–35.7)
MCV: 98 fL — ABNORMAL HIGH (ref 79–97)
Monocytes Absolute: 0.4 x10E3/uL (ref 0.1–0.9)
Monocytes: 9 %
Neutrophils Absolute: 2.7 x10E3/uL (ref 1.4–7.0)
Neutrophils: 60 %
Platelets: 265 x10E3/uL (ref 150–450)
RBC: 4.4 x10E6/uL (ref 4.14–5.80)
RDW: 13 % (ref 11.6–15.4)
WBC: 4.4 x10E3/uL (ref 3.4–10.8)

## 2024-06-08 LAB — LIPID PANEL
Chol/HDL Ratio: 3.1 ratio (ref 0.0–5.0)
Cholesterol, Total: 141 mg/dL (ref 100–199)
HDL: 46 mg/dL (ref >39)
LDL Chol Calc (NIH): 83 mg/dL (ref 0–99)
Triglycerides: 54 mg/dL (ref 0–149)
VLDL Cholesterol Cal: 12 mg/dL (ref 5–40)

## 2024-06-08 LAB — PSA TOTAL (REFLEX TO FREE): Prostate Specific Ag, Serum: 1.3 ng/mL (ref 0.0–4.0)

## 2024-06-13 ENCOUNTER — Ambulatory Visit: Admitting: Nurse Practitioner

## 2024-06-13 ENCOUNTER — Encounter: Payer: Self-pay | Admitting: Nurse Practitioner

## 2024-06-13 VITALS — BP 146/84 | HR 80 | Temp 97.0°F | Resp 16 | Ht 72.0 in | Wt 237.8 lb

## 2024-06-13 DIAGNOSIS — R718 Other abnormality of red blood cells: Secondary | ICD-10-CM

## 2024-06-13 DIAGNOSIS — I10 Essential (primary) hypertension: Secondary | ICD-10-CM

## 2024-06-13 DIAGNOSIS — M545 Low back pain, unspecified: Secondary | ICD-10-CM | POA: Diagnosis not present

## 2024-06-13 DIAGNOSIS — G8929 Other chronic pain: Secondary | ICD-10-CM

## 2024-06-13 NOTE — Progress Notes (Unsigned)
 [facility] [address] [place], [place] [address]  Internal MEDICINE  Office Visit Note  Patient Name: Paul Buckley  [id number]  [id number]  Date of Service: 06/13/2024  Chief Complaint  Patient presents with   Gastroesophageal Reflux   Hypertension   Follow-up    Review labs     HPI Trebor presents for a follow-up visit for Normal PSA Normal cholesterol panel CMP is normal. Creatinine is normal 0.94, eGFR is 82.  CBC is normal except for elevated MCV and elevated MCH. -- donate blood every couple of months.      Current Medication: Outpatient Encounter Medications as of 06/13/2024  Medication Sig   aspirin EC 81 MG tablet Take 81 mg by mouth daily. Swallow whole.   losartan  (COZAAR ) 100 MG tablet Take 1 tablet (100 mg total) by mouth daily.   meloxicam  (MOBIC ) 15 MG tablet Take 15 mg by mouth daily.   No facility-administered encounter medications on file as of 06/13/2024.    Surgical History: Past Surgical History:  Procedure Laterality Date   APPENDECTOMY  1963   COLONOSCOPY WITH PROPOFOL  N/A 04/25/2018   Procedure: COLONOSCOPY WITH PROPOFOL ;  Surgeon: Gaylyn Gladis PENNER, MD;  Location: [facility] ENDOSCOPY;  Service: Endoscopy;  Laterality: N/A;   COLONOSCOPY WITH PROPOFOL  N/A 11/16/2023   Procedure: COLONOSCOPY WITH PROPOFOL ;  Surgeon: Maryruth Ole DASEN, MD;  Location: [facility] ENDOSCOPY;  Service: Endoscopy;  Laterality: N/A;   EYE SURGERY  2013   HERNIA REPAIR  1986   POLYPECTOMY  11/16/2023   Procedure: POLYPECTOMY, INTESTINE;  Surgeon: Maryruth Ole DASEN, MD;  Location: [facility] ENDOSCOPY;  Service: Endoscopy;;    Medical History: Past Medical History:  Diagnosis Date   Chronic venous insufficiency    Colon, diverticulosis    Dupuytren's disease of palm    Dyslipidemia    GERD (gastroesophageal reflux disease)    Hammer toe    Herpes zoster without complication    History of colonic polyps    Hypertension    Internal hemorrhoids    Pneumonia     Superficial thrombophlebitis    Varicose veins of both lower extremities, unspecified whether complicated     Family History: Family History  Problem Relation Age of Onset   Cancer Mother    Cancer Father    Cancer Maternal Uncle    Cancer Paternal Uncle    Heart attack Paternal Grandfather     Social History   Socioeconomic History   Marital status: Widowed    Spouse name: Not on file   Number of children: Not on file   Years of education: Not on file   Highest education level: Master's degree (e.g., MA, MS, MEng, MEd, MSW, MBA)  Occupational History   Not on file  Tobacco Use   Smoking status: Former    Current packs/day: 0.00    Types: Cigarettes    Quit date: 07/14/1967    Years since quitting: 56.9   Smokeless tobacco: Never  Vaping Use   Vaping status: Never Used  Substance and Sexual Activity   Alcohol use: Yes    Comment: social   Drug use: No   Sexual activity: Not Currently  Other Topics Concern   Not on file  Social History Narrative   Not on file   Social Drivers of Health   Financial Resource Strain: Low Risk  (09/01/2023)   Received from [facility]   Overall Financial Resource Strain (CARDIA)    Difficulty of Paying Living  Expenses: Not hard at all  Food Insecurity: No Food Insecurity (09/01/2023)   Received [facility]   Hunger Vital Sign    Within the past 12 months, you worried that your food would run out before you got the money to buy more.: Never true    Within the past 12 months, the food you bought just didn't last and you didn't have money to get more.: Never true  Transportation Needs: No Transportation Needs (09/01/2023)   Received from [facility] - [facility]    In the past 12 months, has lack of transportation kept you from medical appointments or from getting medications?: No    Lack of Transportation (Non-Medical): No  Physical Activity: Insufficiently  Active (11/16/2022)   Exercise Vital Sign    Days of Exercise per Week: 2 days    Minutes of Exercise per Session: 20 min  Stress: No Stress Concern Present (11/16/2022)   [employer] of Occupational Health - Occupational Stress Questionnaire    Feeling of Stress : Not at all  Social Connections: Moderately Integrated (11/16/2022)   Social Connection and Isolation Panel    Frequency of Communication with Friends and Family: Three times a week    Frequency of Social Gatherings with Friends and Family: Once a week    Attends Religious Services: More than 4 times per year    Active Member of [employer] or Organizations: Yes    Attends Banker Meetings: More than 4 times per year    Marital Status: Widowed  Intimate Partner Violence: Not At Risk (12/25/2021)   Humiliation, Afraid, Rape, and Kick questionnaire    Fear of Current or Ex-Partner: No    Emotionally Abused: No    Physically Abused: No    Sexually Abused: No      Review of Systems  Vital Signs: BP (!) 146/84   Pulse 80   Temp (!) 97 F (36.1 C)   Resp 16   Ht 6' (1.829 m)   Wt 237 lb 12.8 oz (107.9 kg)   SpO2 96%   BMI 32.25 kg/m    Physical Exam     Assessment/Plan:   General Counseling: Jacobey verbalizes understanding of the findings of todays visit and agrees with plan of treatment. I have discussed any further diagnostic evaluation that may be needed or ordered today. We also reviewed his medications today. he has been encouraged to call the office with any questions or concerns that should arise related to todays visit.    No orders of the defined types were placed in this encounter.   No orders of the defined types were placed in this encounter.   Return in about 6 months (around 12/12/2024) for F/U, Emonii Wienke PCP.   Total time spent:*** Minutes Time spent includes review of chart, medications, test results, and follow up plan with the patient.   [place] Controlled Substance Database was reviewed  by me.  This patient was seen by Mardy Maxin, FNP-C in collaboration with Dr. Sigrid Bathe as a part of collaborative care agreement.   Larico Dimock R. Maxin, MSN, FNP-C Internal medicine

## 2024-06-14 ENCOUNTER — Encounter: Payer: Self-pay | Admitting: Nurse Practitioner

## 2024-07-24 ENCOUNTER — Encounter: Payer: Self-pay | Admitting: Podiatry

## 2024-07-24 ENCOUNTER — Ambulatory Visit (INDEPENDENT_AMBULATORY_CARE_PROVIDER_SITE_OTHER): Admitting: Podiatry

## 2024-07-24 DIAGNOSIS — M79675 Pain in left toe(s): Secondary | ICD-10-CM

## 2024-07-24 DIAGNOSIS — B351 Tinea unguium: Secondary | ICD-10-CM

## 2024-07-24 DIAGNOSIS — M79674 Pain in right toe(s): Secondary | ICD-10-CM

## 2024-07-31 NOTE — Progress Notes (Signed)
"  °  Subjective:  Patient ID: Paul JINNY Jason Mickey., male    DOB: October 11, 1945,  MRN: [id number]  Paul JINNY Jason Mickey. presents to clinic today for painful thick toenails that are difficult to trim. Pain interferes with ambulation. Aggravating factors include wearing enclosed shoe gear. Pain is relieved with periodic professional debridement.  Chief Complaint  Patient presents with   Nail Problem    He saw NP Abernathy in Dec.    New problem(s): None.   PCP is Liana Fish, NP.  Allergies[1]  Review of Systems: Negative except as noted in the HPI.  Objective: No changes noted in today's physical examination. There were no vitals filed for this visit. Paul JINNY Jason Mickey. is a pleasant 79 y.o. male WD, WN in NAD. AAO x 3.  Vascular Examination: Vascular status intact b/l with palpable pedal pulses. CFT immediate b/l. Pedal hair present. No edema. No pain with calf compression b/l. Skin temperature gradient WNL b/l. No varicosities noted. No cyanosis or clubbing noted.  Neurological Examination: Sensation grossly intact b/l with 10 gram monofilament. Vibratory sensation intact b/l.  Dermatological Examination: Pedal skin with normal turgor, texture and tone b/l. No open wounds nor interdigital macerations noted. Toenails 1-5 b/l thick, discolored, elongated with subungual debris and pain on dorsal palpation. No hyperkeratotic lesions noted b/l.   Musculoskeletal Examination: Muscle strength 5/5 to b/l LE.  No pain, crepitus noted b/l. 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 Patient ambulates independently without assistive aids.   Radiographs: None  Assessment/Plan: 1. Pain due to onychomycosis of toenails of both feet   Patient was evaluated and treated. All patient's and/or POA's questions/concerns addressed on today's visit. Toenails 1-5 b/l debrided in length and girth without incident. Continue soft, supportive shoe gear daily. Report any pedal injuries to medical professional. Call office if there  are any questions/concerns. -Patient/POA to call should there be question/concern in the interim.   Return in about 3 months (around 10/22/2024).  Paul Buckley, DPM      [place] LOCATION: [address], [place] [address]                   Office [phone]   [facility] LOCATION: [address] [place], [place] [address] Office [phone]     [1] No Known Allergies  "

## 2024-10-26 ENCOUNTER — Ambulatory Visit: Admitting: Podiatry

## 2024-12-12 ENCOUNTER — Ambulatory Visit: Admitting: Nurse Practitioner

## 2025-06-11 ENCOUNTER — Ambulatory Visit: Admitting: Nurse Practitioner
